# Patient Record
Sex: Female | Born: 1940
Health system: Southern US, Community
[De-identification: ages and names within clinical notes are randomized; demographics above are authoritative.]

## PROBLEM LIST (undated history)

## (undated) DIAGNOSIS — L03116 Cellulitis of left lower limb: Secondary | ICD-10-CM

## (undated) DIAGNOSIS — E119 Type 2 diabetes mellitus without complications: Secondary | ICD-10-CM

## (undated) DIAGNOSIS — L97529 Non-pressure chronic ulcer of other part of left foot with unspecified severity: Secondary | ICD-10-CM

## (undated) DIAGNOSIS — I10 Essential (primary) hypertension: Secondary | ICD-10-CM

## (undated) DIAGNOSIS — E11621 Type 2 diabetes mellitus with foot ulcer: Secondary | ICD-10-CM

## (undated) DIAGNOSIS — E559 Vitamin D deficiency, unspecified: Secondary | ICD-10-CM

## (undated) DIAGNOSIS — G40909 Epilepsy, unspecified, not intractable, without status epilepticus: Secondary | ICD-10-CM

## (undated) DIAGNOSIS — I739 Peripheral vascular disease, unspecified: Secondary | ICD-10-CM

## (undated) DIAGNOSIS — E785 Hyperlipidemia, unspecified: Secondary | ICD-10-CM

## (undated) HISTORY — DX: Type 2 diabetes mellitus with foot ulcer: E11.621

## (undated) HISTORY — DX: Essential (primary) hypertension: I10

## (undated) HISTORY — DX: Epilepsy, unspecified, not intractable, without status epilepticus: G40.909

## (undated) HISTORY — DX: Vitamin D deficiency, unspecified: E55.9

## (undated) HISTORY — DX: Hyperlipidemia, unspecified: E78.5

## (undated) HISTORY — DX: Cellulitis of left lower limb: L03.116

## (undated) HISTORY — DX: Peripheral vascular disease, unspecified: I73.9

## (undated) HISTORY — DX: Non-pressure chronic ulcer of other part of left foot with unspecified severity: L97.529

---

## 2013-02-07 DIAGNOSIS — I619 Nontraumatic intracerebral hemorrhage, unspecified: Secondary | ICD-10-CM | POA: Insufficient documentation

## 2014-02-25 DIAGNOSIS — H4011X1 Primary open-angle glaucoma, mild stage: Secondary | ICD-10-CM | POA: Diagnosis not present

## 2014-05-05 DIAGNOSIS — I10 Essential (primary) hypertension: Secondary | ICD-10-CM | POA: Diagnosis not present

## 2014-05-05 DIAGNOSIS — E119 Type 2 diabetes mellitus without complications: Secondary | ICD-10-CM | POA: Diagnosis not present

## 2014-05-05 DIAGNOSIS — E785 Hyperlipidemia, unspecified: Secondary | ICD-10-CM | POA: Diagnosis not present

## 2014-05-05 DIAGNOSIS — I739 Peripheral vascular disease, unspecified: Secondary | ICD-10-CM | POA: Diagnosis not present

## 2014-05-05 DIAGNOSIS — E1129 Type 2 diabetes mellitus with other diabetic kidney complication: Secondary | ICD-10-CM | POA: Diagnosis not present

## 2014-06-05 DIAGNOSIS — E119 Type 2 diabetes mellitus without complications: Secondary | ICD-10-CM | POA: Diagnosis not present

## 2014-06-30 DIAGNOSIS — H4011X2 Primary open-angle glaucoma, moderate stage: Secondary | ICD-10-CM | POA: Diagnosis not present

## 2014-09-18 DIAGNOSIS — R609 Edema, unspecified: Secondary | ICD-10-CM | POA: Diagnosis not present

## 2014-09-18 DIAGNOSIS — E119 Type 2 diabetes mellitus without complications: Secondary | ICD-10-CM | POA: Diagnosis not present

## 2014-09-18 DIAGNOSIS — E785 Hyperlipidemia, unspecified: Secondary | ICD-10-CM | POA: Diagnosis not present

## 2014-09-18 DIAGNOSIS — E669 Obesity, unspecified: Secondary | ICD-10-CM | POA: Diagnosis not present

## 2014-11-21 DIAGNOSIS — H401132 Primary open-angle glaucoma, bilateral, moderate stage: Secondary | ICD-10-CM | POA: Diagnosis not present

## 2014-11-21 DIAGNOSIS — H2513 Age-related nuclear cataract, bilateral: Secondary | ICD-10-CM | POA: Diagnosis not present

## 2015-02-24 DIAGNOSIS — E119 Type 2 diabetes mellitus without complications: Secondary | ICD-10-CM | POA: Diagnosis not present

## 2015-02-24 DIAGNOSIS — E118 Type 2 diabetes mellitus with unspecified complications: Secondary | ICD-10-CM | POA: Diagnosis not present

## 2015-02-24 DIAGNOSIS — I739 Peripheral vascular disease, unspecified: Secondary | ICD-10-CM | POA: Diagnosis not present

## 2015-02-24 DIAGNOSIS — E782 Mixed hyperlipidemia: Secondary | ICD-10-CM | POA: Diagnosis not present

## 2015-06-15 DIAGNOSIS — H53461 Homonymous bilateral field defects, right side: Secondary | ICD-10-CM | POA: Diagnosis not present

## 2015-06-15 DIAGNOSIS — H25013 Cortical age-related cataract, bilateral: Secondary | ICD-10-CM | POA: Diagnosis not present

## 2015-06-15 DIAGNOSIS — H401132 Primary open-angle glaucoma, bilateral, moderate stage: Secondary | ICD-10-CM | POA: Diagnosis not present

## 2015-06-15 DIAGNOSIS — H53462 Homonymous bilateral field defects, left side: Secondary | ICD-10-CM | POA: Diagnosis not present

## 2015-07-09 DIAGNOSIS — I10 Essential (primary) hypertension: Secondary | ICD-10-CM | POA: Diagnosis not present

## 2015-07-09 DIAGNOSIS — E782 Mixed hyperlipidemia: Secondary | ICD-10-CM | POA: Diagnosis not present

## 2015-07-09 DIAGNOSIS — Z0000 Encounter for general adult medical examination without abnormal findings: Secondary | ICD-10-CM | POA: Diagnosis not present

## 2015-07-09 DIAGNOSIS — E118 Type 2 diabetes mellitus with unspecified complications: Secondary | ICD-10-CM | POA: Diagnosis not present

## 2015-07-09 DIAGNOSIS — N390 Urinary tract infection, site not specified: Secondary | ICD-10-CM | POA: Diagnosis not present

## 2015-07-17 DIAGNOSIS — I739 Peripheral vascular disease, unspecified: Secondary | ICD-10-CM | POA: Diagnosis not present

## 2015-07-17 DIAGNOSIS — E119 Type 2 diabetes mellitus without complications: Secondary | ICD-10-CM | POA: Diagnosis not present

## 2015-07-17 DIAGNOSIS — E118 Type 2 diabetes mellitus with unspecified complications: Secondary | ICD-10-CM | POA: Diagnosis not present

## 2015-07-17 DIAGNOSIS — E782 Mixed hyperlipidemia: Secondary | ICD-10-CM | POA: Diagnosis not present

## 2015-07-17 DIAGNOSIS — I10 Essential (primary) hypertension: Secondary | ICD-10-CM | POA: Diagnosis not present

## 2016-05-16 ENCOUNTER — Ambulatory Visit
Admission: EM | Admit: 2016-05-16 | Discharge: 2016-05-16 | Disposition: A | Discharge status: Home / Self Care | Payer: Medicare Other | Attending: Family Medicine | Admitting: Family Medicine

## 2016-05-16 ENCOUNTER — Encounter: Payer: Self-pay | Admitting: *Deleted

## 2016-05-16 DIAGNOSIS — E08621 Diabetes mellitus due to underlying condition with foot ulcer: Secondary | ICD-10-CM | POA: Diagnosis not present

## 2016-05-16 DIAGNOSIS — E11621 Type 2 diabetes mellitus with foot ulcer: Secondary | ICD-10-CM | POA: Insufficient documentation

## 2016-05-16 DIAGNOSIS — Z87891 Personal history of nicotine dependence: Secondary | ICD-10-CM | POA: Insufficient documentation

## 2016-05-16 DIAGNOSIS — L97521 Non-pressure chronic ulcer of other part of left foot limited to breakdown of skin: Secondary | ICD-10-CM | POA: Diagnosis not present

## 2016-05-16 DIAGNOSIS — L97421 Non-pressure chronic ulcer of left heel and midfoot limited to breakdown of skin: Secondary | ICD-10-CM | POA: Diagnosis not present

## 2016-05-16 HISTORY — DX: Type 2 diabetes mellitus without complications: E11.9

## 2016-05-16 MED ORDER — CEFAZOLIN SODIUM 1 G IJ SOLR
1.0000 g | Freq: Once | INTRAMUSCULAR | Status: AC
  Administered 2016-05-16: 1 g via INTRAMUSCULAR

## 2016-05-16 MED ORDER — AMOXICILLIN-POT CLAVULANATE 875-125 MG PO TABS: 1.0000 | ORAL_TABLET | Freq: Two times a day (BID) | ORAL | 0 refills | Status: DC

## 2016-05-16 NOTE — ED Triage Notes (Signed)
Diabetic foot ulcer x 2 weeks. Ulcer is to left medial malleolus region.

## 2016-05-16 NOTE — Discharge Instructions (Signed)
Follow up at [facility]) in [place]

## 2016-05-16 NOTE — ED Provider Notes (Signed)
[facility]    CSN: [id number] Arrival date & time: 05/16/16  1027     History   Chief Complaint Chief Complaint  Patient presents with  . Skin Ulcer    HPI Jenaye Rickert is a 76 y.o. female.   76 yo diabetic female with a c/o left foot ulcer for 2-3 weeks progressively worsening with surrounding redness, drainage and pain. Patient denies any fevers or chills. Denies any kidney problems. States does not have a PCP as she just recently moved to this area and is looking for a PCP.   The history is provided by the patient.    Past Medical History:  Diagnosis Date  . Diabetes mellitus without complication (HCC)     There are no active problems to display for this patient.   History reviewed. No pertinent surgical history.  OB History    No data available       Home Medications    Prior to Admission medications   Medication Sig Start Date End Date Taking? Authorizing Provider  amoxicillin-clavulanate (AUGMENTIN) 875-125 MG tablet Take 1 tablet by mouth 2 (two) times daily. 05/16/16   Payton Mccallum, MD    Family History History reviewed. No pertinent family history.  Social History Social History  Substance Use Topics  . Smoking status: Former Games developer  . Smokeless tobacco: Never Used  . Alcohol use Yes     Allergies   Patient has no known allergies.   Review of Systems Review of Systems   Physical Exam Triage Vital Signs ED Triage Vitals  Enc Vitals Group     BP 05/16/16 1118 (!) 175/91     Pulse Rate 05/16/16 1118 60     Resp 05/16/16 1118 16     Temp 05/16/16 1118 98.7 F (37.1 C)     Temp Source 05/16/16 1118 Oral     SpO2 05/16/16 1118 100 %     Weight 05/16/16 1121 200 lb (90.7 kg)     Height 05/16/16 1121  (1.803 m)     Head Circumference --      Peak Flow --      Pain Score 05/16/16 1253 0     Pain Loc --      Pain Edu? --      Excl. in GC? --    No data found.   Updated Vital Signs BP (!) 175/91 (BP  Location: Left Arm)   Pulse 60   Temp 98.7 F (37.1 C) (Oral)   Resp 16   Ht  (1.803 m)   Wt 200 lb (90.7 kg)   SpO2 100%   BMI 27.89 kg/m   Visual Acuity Right Eye Distance:   Left Eye Distance:   Bilateral Distance:    Right Eye Near:   Left Eye Near:    Bilateral Near:     Physical Exam  Constitutional: She appears well-developed and well-nourished. No distress.  Skin: She is not diaphoretic.  approx 4x3cm superficial skin ulceration with mild purulent drainage on the left foot medially, midfoot; 1-2+ DP pulse  Nursing note and vitals reviewed.    UC Treatments / Results  Labs (all labs ordered are listed, but only abnormal results are displayed) Labs Reviewed  AEROBIC CULTURE (SUPERFICIAL SPECIMEN)    EKG  EKG Interpretation None       Radiology No results found.  Procedures Procedures (including critical care time)  Medications Ordered in UC Medications  ceFAZolin (ANCEF) injection 1 g (1 g  Intramuscular Given 05/16/16 1235)     Initial Impression / Assessment and Plan / UC Course  I have reviewed the triage vital signs and the nursing notes.  Pertinent labs & imaging results that were available during my care of the patient were reviewed by me and considered in my medical decision making (see chart for details).      Final Clinical Impressions(s) / UC Diagnoses   Final diagnoses:  Diabetic ulcer of left midfoot associated with diabetes mellitus due to underlying condition, limited to breakdown of skin [facility])    New Prescriptions Discharge Medication List as of 05/16/2016 12:41 PM    START taking these medications   Details  amoxicillin-clavulanate (AUGMENTIN) 875-125 MG tablet Take 1 tablet by mouth 2 (two) times daily., Starting Mon 05/16/2016, Normal       1. diagnosis reviewed with patient 2. Patient given Ancef 1gm IM x 1 3. Sample of drainage obtained for wound culture 4.  rx as per orders above; reviewed possible side  effects, interactions, risks and benefits  5. Recommend follow up at wound care clinic this week for further management 6. Follow-up prn if symptoms worsen or don't improve   Payton Mccallum, MD 05/16/16 1351

## 2016-05-16 NOTE — ED Notes (Signed)
Patient shows no signs of adverse reaction to medication at this time.  

## 2016-05-18 LAB — AEROBIC CULTURE  (SUPERFICIAL SPECIMEN): SPECIAL REQUESTS: NORMAL

## 2016-05-18 LAB — AEROBIC CULTURE W GRAM STAIN (SUPERFICIAL SPECIMEN): Culture: NORMAL

## 2016-05-19 ENCOUNTER — Ambulatory Visit
Admission: RE | Admit: 2016-05-19 | Discharge: 2016-05-19 | Disposition: A | Discharge status: Home / Self Care | Payer: Medicare Other | Source: Ambulatory Visit | Attending: Surgery | Admitting: Surgery

## 2016-05-19 ENCOUNTER — Other Ambulatory Visit: Payer: Self-pay | Admitting: Surgery

## 2016-05-19 ENCOUNTER — Encounter: Payer: Medicare Other | Attending: Surgery | Admitting: Surgery

## 2016-05-19 DIAGNOSIS — Z7984 Long term (current) use of oral hypoglycemic drugs: Secondary | ICD-10-CM | POA: Diagnosis not present

## 2016-05-19 DIAGNOSIS — L089 Local infection of the skin and subcutaneous tissue, unspecified: Secondary | ICD-10-CM

## 2016-05-19 DIAGNOSIS — Z87891 Personal history of nicotine dependence: Secondary | ICD-10-CM | POA: Insufficient documentation

## 2016-05-19 DIAGNOSIS — M86172 Other acute osteomyelitis, left ankle and foot: Secondary | ICD-10-CM

## 2016-05-19 DIAGNOSIS — E785 Hyperlipidemia, unspecified: Secondary | ICD-10-CM | POA: Diagnosis not present

## 2016-05-19 DIAGNOSIS — I739 Peripheral vascular disease, unspecified: Secondary | ICD-10-CM | POA: Diagnosis not present

## 2016-05-19 DIAGNOSIS — E119 Type 2 diabetes mellitus without complications: Secondary | ICD-10-CM | POA: Diagnosis not present

## 2016-05-19 DIAGNOSIS — L97322 Non-pressure chronic ulcer of left ankle with fat layer exposed: Secondary | ICD-10-CM | POA: Insufficient documentation

## 2016-05-19 DIAGNOSIS — M7989 Other specified soft tissue disorders: Secondary | ICD-10-CM | POA: Insufficient documentation

## 2016-05-19 DIAGNOSIS — I87312 Chronic venous hypertension (idiopathic) with ulcer of left lower extremity: Secondary | ICD-10-CM | POA: Diagnosis not present

## 2016-05-19 DIAGNOSIS — E11621 Type 2 diabetes mellitus with foot ulcer: Secondary | ICD-10-CM | POA: Insufficient documentation

## 2016-05-19 DIAGNOSIS — L97522 Non-pressure chronic ulcer of other part of left foot with fat layer exposed: Secondary | ICD-10-CM | POA: Diagnosis not present

## 2016-05-20 NOTE — Progress Notes (Addendum)
JAQUITTA, DUPRIEST ([id number]) Visit Report for 05/19/2016 Chief Complaint Document Details Patient Name: [name], [name] Date of Service: 05/19/2016 10:30 AM Medical Record Number: [id number] Patient Account Number: [id number] Date of Birth/Sex: [birth year] (76 y.o. Female) Treating RN: Ashok Cordia, Debi Primary Care Provider: Schuyler Amor Other Clinician: Referring Provider: Payton Mccallum Treating Provider/Extender: Rudene Re in Treatment: 0 Information Obtained from: Patient Chief Complaint Patients presents for treatment of an open diabetic ulcer to the left medial ankle which she's had for about 3 weeks Electronic Signature(s) Signed: 05/19/2016 12:08:03 PM By: Evlyn Kanner MD, FACS Entered By: Evlyn Kanner on 05/19/2016 12:08:03 [name] ([id number]) -------------------------------------------------------------------------------- Debridement Details Patient Name: [name] Date of Service: 05/19/2016 10:30 AM Medical Record Number: [id number] Patient Account Number: [id number] Date of Birth/Sex: 1940/05/10 (76 y.o. Female) Treating RN: Ashok Cordia, Debi Primary Care Provider: Schuyler Amor Other Clinician: Referring Provider: Payton Mccallum Treating Provider/Extender: Rudene Re in Treatment: 0 Debridement Performed for Wound #1 Left,Medial Foot Assessment: Performed By: Physician Evlyn Kanner, MD Debridement: Debridement Pre-procedure Yes - 11:07 Verification/Time Out Taken: Start Time: 11:08 Pain Control: Lidocaine 4% Topical Solution Level: Skin/Subcutaneous Tissue Total Area Debrided (L x 4 (cm) x 4.5 (cm) = 18 (cm) W): Tissue and other Viable, Non-Viable, Exudate, Fibrin/Slough, Subcutaneous material debrided: Instrument: Forceps, Scissors Bleeding: Minimum Hemostasis Achieved: Pressure End Time: 11:12 Procedural Pain: 0 Post Procedural Pain: 0 Response to Treatment: Procedure was tolerated well Post Debridement  Measurements of Total Wound Length: (cm) 3.4 Width: (cm) 4 Depth: (cm) 0.3 Volume: (cm) 3.204 Character of Wound/Ulcer Post Requires Further Debridement Debridement: Severity of Tissue Post Debridement: Fat layer exposed Post Procedure Diagnosis Same as Pre-procedure Electronic Signature(s) Signed: 05/19/2016 12:07:42 PM By: Evlyn Kanner MD, FACS Signed: 05/19/2016 4:33:22 PM By: Alejandro Mulling Entered By: Evlyn Kanner on 05/19/2016 12:07:41 CYNIAH, GOSSARD ([id number] [name] ([id number]) -------------------------------------------------------------------------------- HPI Details Patient Name: [name] Date of Service: 05/19/2016 10:30 AM Medical Record Number: [id number] Patient Account Number: [id number] Date of Birth/Sex: 04-03-1940 (76 y.o. Female) Treating RN: Ashok Cordia, Debi Primary Care Provider: Schuyler Amor Other Clinician: Referring Provider: Payton Mccallum Treating Provider/Extender: Rudene Re in Treatment: 0 History of Present Illness Location: left medial ankle and foot Quality: Patient reports experiencing a dull pain to affected area(s). Severity: Patient states wound are getting worse. Duration: Patient has had the wound for < 4 weeks prior to presenting for treatment Timing: Pain in wound is constant (hurts all the time) Context: The wound appeared gradually over time Modifying Factors: Other treatment(s) tried include:went to the ER recently where she was put on Augmentin Associated Signs and Symptoms: Patient reports having increase swelling. HPI Description: 76 year old patient known to have diabetes mellitus has a left foot ulcer for about 3 weeks which has been causing her pain with redness and drainage. She was recently seen at the urgent care facility where the patient had been placed on Augmentin and I do not find any x-ray was done. She was also given an intramuscular Ancef injection and asked to follow-up  with the wound care. Culture showed normal skin flora. She is a former smoker She has recently moved from [place] and has not been taking treatment for her diabetes mellitus and though she wears compression stockings she has never been worked up for arterial or venous disease in the past Electronic Signature(s) Signed: 05/19/2016 12:09:22 PM By: Evlyn Kanner MD, FACS Previous Signature: 05/19/2016 10:54:12 AM Version By: Evlyn Kanner MD, FACS Previous Signature: 05/19/2016 10:34:55 AM Version By: Evlyn Kanner MD, FACS Previous  Signature: 05/19/2016 10:31:18 AM Version By: Evlyn Kanner MD, FACS Entered By: Evlyn Kanner on 05/19/2016 12:09:21 [name] ([id number]) -------------------------------------------------------------------------------- Physical Exam Details Patient Name: [name] Date of Service: 05/19/2016 10:30 AM Medical Record Number: [id number] Patient Account Number: [id number] Date of Birth/Sex: 1940-05-20 (75 y.o. Female) Treating RN: Ashok Cordia, Debi Primary Care Provider: Schuyler Amor Other Clinician: Referring Provider: Payton Mccallum Treating Provider/Extender: Rudene Re in Treatment: 0 Constitutional . Pulse regular. Respirations normal and unlabored. Afebrile. . Eyes Nonicteric. Reactive to light. Ears, Nose, Mouth, and Throat Lips, teeth, and gums WNL.Marland Kitchen Moist mucosa without lesions. Neck supple and nontender. No palpable supraclavicular or cervical adenopathy. Normal sized without goiter. Respiratory WNL. No retractions.. Cardiovascular Pedal Pulses WNL. ABI was noncompressible bilaterally. No clubbing, cyanosis or edema.. Chest Breasts symmetical and no nipple discharge.. Breast tissue WNL, no masses, lumps, or tenderness.. Gastrointestinal (GI) Abdomen without masses or tenderness.. No liver or spleen enlargement or tenderness.. Lymphatic No adneopathy. No adenopathy. No adenopathy. Musculoskeletal Adexa without  tenderness or enlargement.. Digits and nails w/o clubbing, cyanosis, infection, petechiae, ischemia, or inflammatory conditions.. Integumentary (Hair, Skin) No suspicious lesions. No crepitus or fluctuance. No peri-wound warmth or erythema. No masses.Marland Kitchen Psychiatric Judgement and insight Intact.. No evidence of depression, anxiety, or agitation.. Notes the patient has typical stigmata of venous hypertension on her left lower extremity and the ulcers in the left medial ankle and foot. She has breakdown of skin and subcutaneous tissue and there is no evidence of surrounding cellulitis. Electronic Signature(s) Signed: 05/19/2016 12:10:14 PM By: Evlyn Kanner MD, FACS Entered By: Evlyn Kanner on 05/19/2016 12:10:13 LYNASIA, MELOCHE ([id number]) EVONA, WESTRA ([id number]) -------------------------------------------------------------------------------- Physician Orders Details Patient Name: [name] Date of Service: 05/19/2016 10:30 AM Medical Record Number: [id number] Patient Account Number: [id number] Date of Birth/Sex: [birth year] (76 y.o. Female) Treating RN: Ashok Cordia, Debi Primary Care Provider: Schuyler Amor Other Clinician: Referring Provider: Payton Mccallum Treating Provider/Extender: Rudene Re in Treatment: 0 Verbal / Phone Orders: Yes Clinician: Pinkerton, Debi Read Back and Verified: Yes Diagnosis Coding Wound Cleansing Wound #1 Left,Medial Foot o Clean wound with Normal Saline. o May Shower, gently pat wound dry prior to applying new dressing. Anesthetic Wound #1 Left,Medial Foot o Topical Lidocaine 4% cream applied to wound bed prior to debridement Primary Wound Dressing Wound #1 Left,Medial Foot o Aquacel Ag Secondary Dressing Wound #1 Left,Medial Foot o ABD pad o Dry Gauze o Conform/Kerlix o Other - tape, stretch netting Dressing Change Frequency Wound #1 Left,Medial Foot o Change dressing every day. Follow-up  Appointments Wound #1 Left,Medial Foot o Return Appointment in 1 week. Edema Control Wound #1 Left,Medial Foot o Elevate legs to the level of the heart and pump ankles as often as possible o Other: - pt to wear own compression hose Additional Orders / Instructions Wound #1 Left,Medial Foot Loconte, Shireen ([id number]) o Increase protein intake. Medications-please add to medication list. Wound #1 Left,Medial Foot o Other: - vitamin c, zinc, MVI Radiology o X-ray, foot - left Services and Therapies o Arterial Studies- Bilateral o Venous Studies -Bilateral Electronic Signature(s) Signed: 05/19/2016 2:17:24 PM By: Evlyn Kanner MD, FACS Signed: 05/19/2016 4:33:22 PM By: Alejandro Mulling Entered By: Alejandro Mulling on 05/19/2016 11:25:40 [name] ([id number]) -------------------------------------------------------------------------------- Problem List Details Patient Name: [name] Date of Service: 05/19/2016 10:30 AM Medical Record Number: [id number] Patient Account Number: [id number] Date of Birth/Sex: 02/24/1940 (76 y.o. 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 05/19/2016 Yes exposed I73.9 Peripheral vascular disease, unspecified 05/19/2016 Yes I87.312 Chronic venous hypertension (idiopathic) with ulcer of left 05/19/2016 Yes lower extremity Inactive Problems Resolved Problems Electronic Signature(s) Signed: 05/19/2016 12:07:22 PM By: Evlyn Kanner MD, FACS Entered By: Evlyn Kanner on 05/19/2016 12:07:21 [name]  ([id number]) -------------------------------------------------------------------------------- Progress Note Details Patient Name: [name] Date of Service: 05/19/2016 10:30 AM Medical Record Number: [id number] Patient Account Number: [id number] Date of Birth/Sex: [birth year] (76 y.o. Female) Treating RN: Phillis Haggis Primary Care Provider: Schuyler Amor Other Clinician: Referring Provider: Payton Mccallum Treating Provider/Extender: Rudene Re in Treatment: 0 Subjective Chief Complaint Information obtained from Patient Patients presents for treatment of an open diabetic ulcer to the left medial ankle which she's had for about 3 weeks History of Present Illness (HPI) The following HPI elements were documented for the patient's wound: Location: left medial ankle and foot Quality: Patient reports experiencing a dull pain to affected area(s). Severity: Patient states wound are getting worse. Duration: Patient has had the wound for < 4 weeks prior to presenting for treatment Timing: Pain in wound is constant (hurts all the time) Context: The wound appeared gradually over time Modifying Factors: Other treatment(s) tried include:went to the ER recently where she was put on Augmentin Associated Signs and Symptoms: Patient reports having increase swelling. 76 year old patient known to have diabetes mellitus has a left foot ulcer for about 3 weeks which has been causing her pain with redness and drainage. She was recently seen at the urgent care facility where the patient had been placed on Augmentin and I do not find any x-ray was done. She was also given an intramuscular Ancef injection and asked to follow-up with the wound care. Culture showed normal skin flora. She is a former smoker She has recently moved from [place] and has not been taking treatment for her diabetes mellitus and though she wears compression stockings she has never been worked up for arterial or  venous disease in the past Wound History Patient presents with 2 open wounds that have been present for approximately 3 wks. Patient has been treating wounds in the following manner: vaseline, Augmentin. Laboratory tests have not been performed in the last month. Patient reportedly has not tested positive for an antibiotic resistant organism. Patient reportedly has not tested positive for osteomyelitis. Patient reportedly has not had testing performed to evaluate circulation in the legs. Patient experiences the following problems associated with their wounds: swelling. Patient History CHESSA, BARRASSO ([id number]) Information obtained from Patient. Allergies NKDA Family History Diabetes - Child. Social History Former smoker - quit 10 yrs ago, Marital Status - Married, Alcohol Use - Rarely, Drug Use - No History, Caffeine Use - Daily. Medical History Endocrine Patient has history of Type II Diabetes Patient is treated with Oral Agents. Blood sugar is not tested. Review of Systems (ROS) Constitutional Symptoms (General Health) The patient has no complaints or symptoms. Eyes Complains or has symptoms of Glasses / Contacts. Ear/Nose/Mouth/Throat The patient has no complaints or symptoms. Hematologic/Lymphatic The patient has no complaints or symptoms. Respiratory The patient has no complaints or symptoms. Cardiovascular hyperlipidemia Genitourinary The patient has no complaints or symptoms. Immunological The patient has no complaints or symptoms. Integumentary (Skin) Complains or has symptoms of Wounds. Musculoskeletal The patient has no complaints or symptoms. Neurologic The patient has no complaints  or symptoms. Oncologic The patient has no complaints or symptoms. Psychiatric The patient has no complaints or symptoms. KATLYN, MULDREW ([id number]) Medications Crestor Janumet Vitamin B-12 amoxicillin 875 mg-potassium clavulanate 125 mg tablet oral tablet  oral Aspirin Childrens 81 mg chewable tablet oral tablet,chewable oral Objective Constitutional Pulse regular. Respirations normal and unlabored. Afebrile. Vitals Time Taken: 10:30 AM, Height: 71 in, Source: Stated, Weight: 212.2 lbs, Source: Measured, BMI: 29.6, Temperature: 97.7 F, Pulse: 59 bpm, Respiratory Rate: 18 breaths/min, Blood Pressure: 168/78 mmHg. Eyes Nonicteric. Reactive to light. Ears, Nose, Mouth, and Throat Lips, teeth, and gums WNL.Marland Kitchen Moist mucosa without lesions. Neck supple and nontender. No palpable supraclavicular or cervical adenopathy. Normal sized without goiter. Respiratory WNL. No retractions.. Cardiovascular Pedal Pulses WNL. ABI was noncompressible bilaterally. No clubbing, cyanosis or edema.. Chest Breasts symmetical and no nipple discharge.. Breast tissue WNL, no masses, lumps, or tenderness.. Gastrointestinal (GI) Abdomen without masses or tenderness.. No liver or spleen enlargement or tenderness.. Lymphatic No adneopathy. No adenopathy. No adenopathy. Musculoskeletal Adexa without tenderness or enlargement.. Digits and nails w/o clubbing, cyanosis, infection, petechiae, ischemia, or inflammatory conditions.Marland Kitchen Psychiatric MIHIKA, SURRETTE ([id number]) Judgement and insight Intact.. No evidence of depression, anxiety, or agitation.. General Notes: the patient has typical stigmata of venous hypertension on her left lower extremity and the ulcers in the left medial ankle and foot. She has breakdown of skin and subcutaneous tissue and there is no evidence of surrounding cellulitis. Integumentary (Hair, Skin) No suspicious lesions. No crepitus or fluctuance. No peri-wound warmth or erythema. No masses.. Wound #1 status is Open. Original cause of wound was Gradually Appeared. The wound is located on the Left,Medial Foot. The wound measures 4cm length x 4.5cm width x 0.1cm depth; 14.137cm^2 area and 1.414cm^3 volume. There is no tunneling or  undermining noted. There is a large amount of serous drainage noted. The wound margin is distinct with the outline attached to the wound base. There is no granulation within the wound bed. There is a large (67-100%) amount of necrotic tissue within the wound bed including Eschar and Adherent Slough. Periwound temperature was noted as No Abnormality. The periwound has tenderness on palpation. 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 After examining her my clinical impression is that of venous hypertension with stigmata of venous disease on the left lower extremity. I have recommended: 1. Silver alginate and a light Kerlix dressing and she can wear her compression stockings over this. 2. Elevation and exercise have been discussed with her in great detail. 3. arterial duplex study of both lower extremities 4. Venous reflux studies of both lower extremities 5. X-ray of the left foot He and her husband have read all questions answered and will be compliant with seeing her PCP for control of her medical condition and seeing me regularly at the wound center [place] ([id number]) Procedures Wound #1 Wound #1 is a Diabetic Wound/Ulcer of the Lower Extremity located on the Left,Medial Foot . There was a Skin/Subcutaneous Tissue Debridement ([id number]) debridement with total area of 18 sq cm performed by Evlyn Kanner, MD. with the following instrument(s): Forceps and Scissors to remove Viable and  Non- Viable tissue/material including Exudate,  Fibrin/Slough, and Subcutaneous after achieving pain control using Lidocaine 4% Topical Solution. A time out was conducted at 11:07, prior to the start of the procedure. A Minimum amount of bleeding was controlled with Pressure. The procedure was tolerated well with a pain level of 0 throughout and a pain level of 0 following the procedure. Post Debridement Measurements: 3.4cm length x 4cm width x 0.3cm depth; 3.204cm^3 volume. Character of Wound/Ulcer Post Debridement requires further debridement. Severity of Tissue Post Debridement is: Fat layer exposed. Post procedure Diagnosis Wound #1: Same as Pre-Procedure Plan Wound Cleansing: Wound #1 Left,Medial Foot: Clean wound with Normal Saline. May Shower, gently pat wound dry prior to applying new dressing. Anesthetic: Wound #1 Left,Medial Foot: Topical Lidocaine 4% cream applied to wound bed prior to debridement Primary Wound Dressing: Wound #1 Left,Medial Foot: Aquacel Ag Secondary Dressing: Wound #1 Left,Medial Foot: ABD pad Dry Gauze Conform/Kerlix Other - tape, stretch netting Dressing Change Frequency: Wound #1 Left,Medial Foot: Change dressing every day. Follow-up Appointments: Wound #1 Left,Medial Foot: Return Appointment in 1 week. Edema Control: Wound #1 Left,Medial Foot: Elevate legs to the level of the heart and pump ankles as often as possible Other: - pt to wear own compression hose Additional Orders / InstructionsLUELLA, GARDENHIRE ([id number]) Wound #1 Left,Medial Foot: Increase protein intake. Medications-please add to medication list.: Wound #1 Left,Medial Foot: Other: - vitamin c, zinc, MVI Radiology ordered were: X-ray, foot - left Services and Therapies ordered were: Arterial Studies- Bilateral, Venous Studies -Bilateral this 76 year old patient has been noncompliant with her medications and management of diabetes and has not taken treatment for about 6 months and has not had her A1c measured  recently. After examining her my clinical impression is that of venous hypertension with stigmata of venous disease on the left lower extremity. I have recommended: 1. Silver alginate and a light Kerlix dressing and she can wear her compression stockings over this. 2. Elevation and exercise have been discussed with her in great detail. 3. arterial duplex study of both lower extremities 4. Venous reflux studies of both lower extremities 5. X-ray of the left foot He and her husband have read all questions answered and will be compliant with seeing her PCP for control of her medical condition and seeing me regularly at the wound center Electronic Signature(s) Signed: 05/19/2016 12:13:05 PM By: Evlyn Kanner MD, FACS Entered By: Evlyn Kanner on 05/19/2016 12:13:04 [name] ([id number]) -------------------------------------------------------------------------------- ROS/PFSH Details Patient Name: [name] Date of Service: 05/19/2016 10:30 AM Medical Record Number: [id number] Patient Account Number: [id number] Date of Birth/Sex: 23-Jan-1941 (76 y.o. Female) Treating RN: Ashok Cordia, Debi Primary Care Provider: Schuyler Amor Other Clinician: Referring Provider: Payton Mccallum Treating Provider/Extender: Rudene Re in Treatment: 0 Information Obtained From Patient Wound History Do you currently have one or more open woundso Yes How many open wounds do you currently haveo 2 Approximately how long have you had your woundso 3 wks How have you been treating your wound(s) until nowo vaseline, Augmentin Has your wound(s) ever healed and then re-openedo No Have you had any lab work done in the past montho No Have you tested positive for an antibiotic resistant organism (MRSA, VRE)o No Have you tested positive for osteomyelitis (bone infection)o No Have you had any tests for circulation on your legso No Have you had other problems associated with your woundso  Swelling Eyes Complaints and Symptoms: Positive for: Glasses / Contacts Integumentary (Skin) Complaints and Symptoms: Positive for: Wounds Constitutional Symptoms (  General Health) Complaints and Symptoms: No Complaints or Symptoms Ear/Nose/Mouth/Throat Complaints and Symptoms: No Complaints or Symptoms Hematologic/Lymphatic Complaints and Symptoms: No Complaints or Symptoms Respiratory JASLYNN, THOME ([id number]) Complaints and Symptoms: No Complaints or Symptoms Cardiovascular Complaints and Symptoms: Review of System Notes: hyperlipidemia Endocrine Medical History: Positive for: Type II Diabetes Time with diabetes: 6 yrs Treated with: Oral agents Blood sugar tested every day: No Genitourinary Complaints and Symptoms: No Complaints or Symptoms Immunological Complaints and Symptoms: No Complaints or Symptoms Musculoskeletal Complaints and Symptoms: No Complaints or Symptoms Neurologic Complaints and Symptoms: No Complaints or Symptoms Oncologic Complaints and Symptoms: No Complaints or Symptoms Psychiatric Complaints and Symptoms: No Complaints or Symptoms Immunizations Pneumococcal Vaccine: Received Pneumococcal Vaccination: No VANESA, RENIER ([id number]) Family and Social History Diabetes: Yes - Child; Former smoker - quit 10 yrs ago; Marital Status - Married; Alcohol Use: Rarely; Drug Use: No History; Caffeine Use: Daily; Financial Concerns: No; Food, Clothing or Shelter Needs: No; Support System Lacking: No; Transportation Concerns: No; Advanced Directives: No; Patient does not want information on Advanced Directives; Do not resuscitate: No; Living Will: No; Medical Power of Attorney: No Physician Affirmation I have reviewed and agree with the above information. Electronic Signature(s) Signed: 05/19/2016 2:17:24 PM By: Evlyn Kanner MD, FACS Signed: 05/19/2016 4:33:22 PM By: Alejandro Mulling Entered By: Evlyn Kanner on 05/19/2016  11:00:49 [name] ([id number]) -------------------------------------------------------------------------------- SuperBill Details Patient Name: [name] Date of Service: 05/19/2016 Medical Record Number: [id number] Patient Account Number: [id number] Date of Birth/Sex: May 20, 1940 (76 y.o. Female) Treating RN: Ashok Cordia, Debi Primary Care Provider: Schuyler Amor Other Clinician: Referring Provider: Payton Mccallum Treating Provider/Extender: Rudene Re in Treatment: 0 Diagnosis Coding ICD-10 Codes Code Description E11.621 Type 2 diabetes mellitus with foot ulcer L97.322 Non-pressure chronic ulcer of left ankle with fat layer exposed I73.9 Peripheral vascular disease, unspecified I87.312 Chronic venous hypertension (idiopathic) with ulcer of left lower extremity Facility Procedures CPT4 Code Description: [id number] 99213 - WOUND CARE VISIT-LEV 3 EST PT Modifier: Quantity: 1 CPT4 Code Description: [id number] 11042 - DEB SUBQ TISSUE 20 SQ CM/< ICD-10 Description Diagnosis E11.621 Type 2 diabetes mellitus with foot ulcer L97.322 Non-pressure chronic ulcer of left ankle with fat la I87.312 Chronic venous hypertension (idiopathic)  with ulcer Modifier: yer exposed of left lower Quantity: 1 extremity Physician Procedures CPT4 Code Description: [id number] - WC PHYS LEVEL 4 - NEW PT ICD-10 Description Diagnosis E11.621 Type 2 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 Peripheral vascular disease, unspecified  I87.312 Chronic venous hypertension (idiopathic) with ulcer Modifier: 25 yer exposed of left lower Quantity: 1 extremity CPT4 Code Description: [id number] - WC PHYS SUBQ TISS 20 SQ CM ICD-10 Description Diagnosis E11.621 Type 2 diabetes mellitus with foot ulcer 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

## 2016-05-21 NOTE — Progress Notes (Signed)
KERRIE, TIMM ([id number]) Visit Report for 05/19/2016 Abuse/Suicide Risk Screen Details Patient Name: [name], [name] Date of Service: 05/19/2016 10:30 AM Medical Record Number: [id number] Patient Account Number: [id number] Date of Birth/Sex: 12-Feb-1940 (76 y.o. Female) Treating RN: Ashok Cordia, Debi Primary Care Tacey Dimaggio: Schuyler Amor Other Clinician: Referring Cyncere Ruhe: Payton Mccallum Treating Nolita Kutter/Extender: Rudene Re in Treatment: 0 Abuse/Suicide Risk Screen Items Answer ABUSE/SUICIDE RISK SCREEN: Has anyone close to you tried to hurt or harm you recentlyo No Do you feel uncomfortable with anyone in your familyo No Has anyone forced you do things that you didnot want to doo No Do you have any thoughts of harming yourselfo No Patient displays signs or symptoms of abuse and/or neglect. No Electronic Signature(s) Signed: 05/19/2016 4:33:22 PM By: Alejandro Mulling Entered By: Alejandro Mulling on 05/19/2016 10:39:11 [name] ([id number]) -------------------------------------------------------------------------------- Activities of Daily Living Details Patient Name: [name] Date of Service: 05/19/2016 10:30 AM Medical Record Number: [id number] Patient Account Number: [id number] Date of Birth/Sex: [birth year] (76 y.o. Female) Treating RN: Phillis Haggis Primary Care Ronnell Makarewicz: Schuyler Amor Other Clinician: Referring Mahdiya Mossberg: Payton Mccallum Treating Omega Durante/Extender: Rudene Re in Treatment: 0 Activities of Daily Living Items Answer Activities of Daily Living (Please select one for each item) Drive Automobile Completely Able Take Medications Completely Able Use Telephone Completely Able Care for Appearance Completely Able Use Toilet Completely Able Bath / Shower Completely Able Dress Self Completely Able Feed Self Completely Able Walk Completely Able Get In / Out Bed Completely Able Housework Completely Able Prepare Meals  Completely Able Handle Money Completely Able Shop for Self Completely Able Electronic Signature(s) Signed: 05/19/2016 4:33:22 PM By: Alejandro Mulling Entered By: Alejandro Mulling on 05/19/2016 10:39:27 [name] ([id number]) -------------------------------------------------------------------------------- Education Assessment Details Patient Name: [name] Date of Service: 05/19/2016 10:30 AM Medical Record Number: [id number] Patient Account Number: [id number] Date of Birth/Sex: 1940-05-31 (76 y.o. Female) Treating RN: Ashok Cordia, Debi Primary Care Tishina Lown: Schuyler Amor Other Clinician: Referring Rashod Gougeon: Payton Mccallum Treating Bettejane Leavens/Extender: Rudene Re in Treatment: 0 Primary Learner Assessed: Patient Learning Preferences/Education Level/Primary Language Learning Preference: Explanation, Printed Material Highest Education Level: High School Preferred Language: English Cognitive Barrier Assessment/Beliefs Language Barrier: No Translator Needed: No Memory Deficit: No Emotional Barrier: No Cultural/Religious Beliefs Affecting Medical No Care: Physical Barrier Assessment Impaired Vision: Yes Glasses Impaired Hearing: No Decreased Hand dexterity: No Knowledge/Comprehension Assessment Knowledge Level: Medium Comprehension Level: Medium Ability to understand written Medium instructions: Ability to understand verbal Medium instructions: Motivation Assessment Anxiety Level: Calm Cooperation: Cooperative Education Importance: Acknowledges Need Interest in Health Problems: Asks Questions Perception: Coherent Willingness to Engage in Self- Medium Management Activities: Readiness to Engage in Self- Medium Management Activities: Electronic Signature(s) DEB, LOUDIN ([id number]) Signed: 05/19/2016 4:33:22 PM By: Alejandro Mulling Entered By: Alejandro Mulling on 05/19/2016 10:39:55 [name]  ([id number]) -------------------------------------------------------------------------------- Fall Risk Assessment Details Patient Name: [name] Date of Service: 05/19/2016 10:30 AM Medical Record Number: [id number] Patient Account Number: [id number] Date of Birth/Sex: 06/20/1940 (76 y.o. Female) Treating RN: Ashok Cordia, Debi Primary Care Rickie Gutierres: Schuyler Amor Other Clinician: Referring Christiana Gurevich: Payton Mccallum Treating Kerrilyn Azbill/Extender: Rudene Re in Treatment: 0 Fall Risk Assessment Items Have you had 2 or more falls in the last 12 monthso 0 No Have you had any fall that resulted in injury in the last 12 monthso 0 No FALL RISK ASSESSMENT: History of falling - immediate or within 3 months 0 No Secondary diagnosis 0 No Ambulatory aid None/bed rest/wheelchair/nurse 0 No Crutches/cane/walker 0 No Furniture 0 No IV Access/Saline Lock 0 No Gait/Training  Normal/bed rest/immobile 0 No Weak 0 No Impaired 0 No Mental Status Oriented to own ability 0 Yes Electronic Signature(s) Signed: 05/19/2016 4:33:22 PM By: Alejandro Mulling Entered By: Alejandro Mulling on 05/19/2016 10:40:06 [name] ([id number]) -------------------------------------------------------------------------------- Foot Assessment Details Patient Name: [name] Date of Service: 05/19/2016 10:30 AM Medical Record Number: [id number] Patient Account Number: [id number] Date of Birth/Sex: 12-09-1940 (76 y.o. Female) Treating RN: Ashok Cordia, Debi Primary Care Lyall Faciane: Schuyler Amor Other Clinician: Referring Kyrsten Deleeuw: Payton Mccallum Treating Rhodes Calvert/Extender: Rudene Re in Treatment: 0 Foot Assessment Items Site Locations + = Sensation present, - = Sensation absent, C = Callus, U = Ulcer R = Redness, W = Warmth, M = Maceration, PU = Pre-ulcerative lesion F = Fissure, S = Swelling, D = Dryness Assessment Right: Left: Other Deformity: No No Prior Foot Ulcer: No  No Prior Amputation: No No Charcot Joint: No No Ambulatory Status: Ambulatory Without Help Gait: Steady Electronic Signature(s) Signed: 05/19/2016 4:33:22 PM By: Alejandro Mulling Entered By: Alejandro Mulling on 05/19/2016 10:41:56 [name] ([id number]) -------------------------------------------------------------------------------- Nutrition Risk Assessment Details Patient Name: [name] Date of Service: 05/19/2016 10:30 AM Medical Record Number: [id number] Patient Account Number: [id number] Date of Birth/Sex: Jun 20, 1940 (77 y.o. Female) Treating RN: Ashok Cordia, Debi Primary Care Horace Lukas: Schuyler Amor Other Clinician: Referring Reta Norgren: Payton Mccallum Treating Agatha Duplechain/Extender: Rudene Re in Treatment: 0 Height (in): 71 Weight (lbs): 212.2 Body Mass Index (BMI): 29.6 Nutrition Risk Assessment Items NUTRITION RISK SCREEN: I have an illness or condition that made me change the kind and/or 0 No amount of food I eat I eat fewer than two meals per day 0 No I eat few fruits and vegetables, or milk products 0 No I have three or more drinks of beer, liquor or wine almost every day 0 No I have tooth or mouth problems that make it hard for me to eat 0 No I don't always have enough money to buy the food I need 0 No I eat alone most of the time 0 No I take three or more different prescribed or over-the-counter drugs a 1 Yes day Without wanting to, I have lost or gained 10 pounds in the last six 0 No months I am not always physically able to shop, cook and/or feed myself 0 No Nutrition Protocols Good Risk Protocol Moderate Risk Protocol Electronic Signature(s) Signed: 05/19/2016 4:33:22 PM By: Alejandro Mulling Entered By: Alejandro Mulling on 05/19/2016 10:40:12

## 2016-05-21 NOTE — Progress Notes (Signed)
[name], [name] ([id number]) Visit Report for 05/19/2016 Allergy List Details Patient Name: [name], [name] Date of Service: 05/19/2016 10:30 AM Medical Record Number: [id number] Patient Account Number: [id number] Date of Birth/Sex: Dec 26, 1940 (76 y.o. Female) Treating RN: Ashok Cordia, Debi Primary Care Mayeli Bornhorst: Schuyler Amor Other Clinician: Referring Tarnisha Kachmar: Payton Mccallum Treating Christoher Drudge/Extender: Rudene Re in Treatment: 0 Allergies Active Allergies NKDA Allergy Notes Electronic Signature(s) Signed: 05/19/2016 4:33:22 PM By: Alejandro Mulling Entered By: Alejandro Mulling on 05/19/2016 10:33:51 [name] ([id number]) -------------------------------------------------------------------------------- Arrival Information Details Patient Name: [name] Date of Service: 05/19/2016 10:30 AM Medical Record Number: [id number] Patient Account Number: [id number] Date of Birth/Sex: [birth year] (75 y.o. Female) Treating RN: Ashok Cordia, Debi Primary Care Henriette Hesser: Schuyler Amor Other Clinician: Referring Odell Choung: Payton Mccallum Treating Randale Carvalho/Extender: Rudene Re in Treatment: 0 Visit Information Patient Arrived: Ambulatory Arrival Time: 10:26 Accompanied By: husband Transfer Assistance: None Patient Identification Verified: Yes Secondary Verification Process Yes Completed: Patient Requires Transmission- No Based Precautions: Patient Has Alerts: Yes Patient Alerts: DM II L ABI noncompressible Electronic Signature(s) Signed: 05/19/2016 4:33:22 PM By: Alejandro Mulling Entered By: Alejandro Mulling on 05/19/2016 10:47:32 [name] ([id number]) -------------------------------------------------------------------------------- Clinic Level of Care Assessment Details Patient Name: [name] Date of Service: 05/19/2016 10:30 AM Medical Record Number: [id number] Patient Account Number: [id number] Date of Birth/Sex: 1940/06/14  (76 y.o. Female) Treating RN: Ashok Cordia, Debi Primary Care Norvella Loscalzo: Schuyler Amor Other Clinician: Referring Mashayla Lavin: Payton Mccallum Treating Farrel Guimond/Extender: Rudene Re in Treatment: 0 Clinic Level of Care Assessment Items TOOL 1 Quantity Score X - Use when EandM and Procedure is performed on INITIAL visit 1 0 ASSESSMENTS - Nursing Assessment / Reassessment X - General Physical Exam (combine w/ comprehensive assessment (listed just 1 20 below) when performed on new pt. evals) X - Comprehensive Assessment (HX, ROS, Risk Assessments, Wounds Hx, etc.) 1 25 ASSESSMENTS - Wound and Skin Assessment / Reassessment  - Dermatologic / Skin Assessment (not related to wound area) 0 ASSESSMENTS - Ostomy and/or Continence Assessment and Care  - Incontinence Assessment and Management 0  - Ostomy Care Assessment and Management (repouching, etc.) 0 PROCESS - Coordination of Care  - Simple Patient / Family Education for ongoing care 0 X - Complex (extensive) Patient / Family Education for ongoing care 1 20 X - Staff obtains Chiropractor, Records, Test Results / Process Orders 1 10  - Staff telephones HHA, Nursing Homes / Clarify orders / etc 0  - Routine Transfer to another Facility (non-emergent condition) 0  - Routine Hospital Admission (non-emergent condition) 0 X - New Admissions / Manufacturing engineer / Ordering NPWT, Apligraf, etc. 1 15  - Emergency Hospital Admission (emergent condition) 0 PROCESS - Special Needs  - Pediatric / Minor Patient Management 0  - Isolation Patient Management 0 [name], [name] ([id number])  - Hearing / Language / Visual special needs 0  - Assessment of Community assistance (transportation, D/C planning, etc.) 0  - Additional assistance / Altered mentation 0  - Support Surface(s) Assessment (bed, cushion, seat, etc.) 0 INTERVENTIONS - Miscellaneous  - External ear exam 0  - Patient Transfer (multiple staff / Water quality scientist / Similar devices) 0  - Simple Staple / Suture removal (25 or less) 0  - Complex Staple / Suture removal (26 or more) 0  - Hypo/Hyperglycemic Management (do not check if billed separately) 0 X - Ankle / Brachial Index (ABI) - do not check if billed separately 1 15 Has the patient been seen at the hospital within the last three years: Yes Total  Score: 105 Level Of Care: New/Established - Level 3 Electronic Signature(s) Signed: 05/19/2016 4:33:22 PM By: Alejandro Mulling Entered By: Alejandro Mulling on 05/19/2016 13:52:58 [name] ([id number]) -------------------------------------------------------------------------------- Encounter Discharge Information Details Patient Name: [name] Date of Service: 05/19/2016 10:30 AM Medical Record Number: [id number] Patient Account Number: [id number] Date of Birth/Sex: 1940-09-27 (76 y.o. Female) Treating RN: Ashok Cordia, Debi Primary Care Nykerria Macconnell: Schuyler Amor Other Clinician: Referring Ulus Hazen: Payton Mccallum Treating Uri Covey/Extender: Rudene Re in Treatment: 0 Encounter Discharge Information Items Discharge Pain Level: 0 Discharge Condition: Stable Ambulatory Status: Ambulatory Discharge Destination: Home Transportation: Private Auto Accompanied By: husband Schedule Follow-up Appointment: Yes Medication Reconciliation completed and provided to Patient/Care No Carmello Cabiness: Provided on Clinical Summary of Care: 05/19/2016 Form Type Recipient Paper Patient TM Electronic Signature(s) Signed: 05/19/2016 4:33:22 PM By: Alejandro Mulling Previous Signature: 05/19/2016 11:33:14 AM Version By: Gwenlyn Perking Entered By: Alejandro Mulling on 05/19/2016 11:40:53 [name] ([id number]) -------------------------------------------------------------------------------- Lower Extremity Assessment Details Patient Name: [name] Date of Service: 05/19/2016 10:30 AM Medical Record Number:  [id number] Patient Account Number: [id number] Date of Birth/Sex: 05-Mar-1940 (76 y.o. Female) Treating RN: Ashok Cordia, Debi Primary Care Aphrodite Harpenau: Schuyler Amor Other Clinician: Referring Keldric Poyer: Payton Mccallum Treating Lurlene Ronda/Extender: Rudene Re in Treatment: 0 Edema Assessment Assessed: [Left: No] [Right: No] E[Left: dema] [Right: :] Calf Left: Right: Point of Measurement: 34 cm From Medial Instep 39.8 cm cm Ankle Left: Right: Point of Measurement: 9 cm From Medial Instep 22 cm cm Vascular Assessment Pulses: Dorsalis Pedis Palpable: [Left:No] Posterior Tibial Extremity colors, hair growth, and conditions: Extremity Color: [Left:Hyperpigmented] Temperature of Extremity: [Left:Cool] Capillary Refill: [Left:> 3 seconds] Notes L ABI noncompressible Electronic Signature(s) Signed: 05/19/2016 4:33:22 PM By: Alejandro Mulling Entered By: Alejandro Mulling on 05/19/2016 10:47:05 [name] ([id number]) -------------------------------------------------------------------------------- Multi Wound Chart Details Patient Name: [name] Date of Service: 05/19/2016 10:30 AM Medical Record Number: [id number] Patient Account Number: [id number] Date of Birth/Sex: 1940-11-22 (76 y.o. Female) Treating RN: Ashok Cordia, Debi Primary Care Trentan Trippe: Schuyler Amor Other Clinician: Referring Karlo Goeden: Payton Mccallum Treating Fronnie Urton/Extender: Rudene Re in Treatment: 0 Vital Signs Height(in): 71 Pulse(bpm): 59 Weight(lbs): 212.2 Blood Pressure 168/78 (mmHg): Body Mass Index(BMI): 30 Temperature(F): 97.7 Respiratory Rate 18 (breaths/min): Photos: [1:No Photos] [N/A:N/A] Wound Location: [1:Left Foot - Lateral] [N/A:N/A] Wounding Event: [1:Gradually Appeared] [N/A:N/A] Primary Etiology: [1:Diabetic Wound/Ulcer of the Lower Extremity] [N/A:N/A] Comorbid History: [1:Type II Diabetes] [N/A:N/A] Date Acquired: [1:04/28/2016] [N/A:N/A] Weeks of Treatment:  [1:0] [N/A:N/A] Wound Status: [1:Open] [N/A:N/A] Measurements L x W x D 4x4.5x0.1 [N/A:N/A] (cm) Area (cm) : [1:14.137] [N/A:N/A] Volume (cm) : [1:1.414] [N/A:N/A] Classification: [1:Grade 1] [N/A:N/A] Exudate Amount: [1:Large] [N/A:N/A] Exudate Type: [1:Serous] [N/A:N/A] Exudate Color: [1:amber] [N/A:N/A] Wound Margin: [1:Distinct, outline attached] [N/A:N/A] Granulation Amount: [1:None Present (0%)] [N/A:N/A] Necrotic Amount: [1:Large (67-100%)] [N/A:N/A] Necrotic Tissue: [1:Eschar, Adherent Slough] [N/A:N/A] Epithelialization: [1:None] [N/A:N/A] Debridement: [1:Debridement (53664- [id number])] [N/A:N/A] Pre-procedure [1:11:07] [N/A:N/A] Verification/Time Out Taken: Pain Control: [1:Lidocaine 4% Topical Solution] [N/A:N/A] Tissue Debrided: [N/A:N/A] Fibrin/Slough, Exudates, Subcutaneous Level: Skin/Subcutaneous N/A N/A Tissue Debridement Area (sq 18 N/A N/A cm): Instrument: Forceps, Scissors N/A N/A Bleeding: Minimum N/A N/A Hemostasis Achieved: Pressure N/A N/A Procedural Pain: 0 N/A N/A Post Procedural Pain: 0 N/A N/A Debridement Treatment Procedure was tolerated N/A N/A Response: well Post Debridement 3.4x4x0.3 N/A N/A Measurements L x W x D (cm) Post Debridement 3.204 N/A N/A Volume: (cm) Periwound Skin Texture: No Abnormalities Noted N/A N/A Periwound Skin No Abnormalities Noted N/A N/A Moisture: Periwound Skin Color: No Abnormalities Noted N/A N/A Temperature: No Abnormality N/A N/A  Tenderness on Yes N/A N/A Palpation: Wound Preparation: Ulcer Cleansing: N/A N/A Rinsed/Irrigated with Saline Topical Anesthetic Applied: Other: lidocaine 4% Procedures Performed: Debridement N/A N/A Treatment Notes Wound #1 (Left, Medial Foot) 1. Cleansed with: Clean wound with Normal Saline 2. Anesthetic Topical Lidocaine 4% cream to wound bed prior to debridement 4. Dressing Applied: Aquacel Ag 5. Secondary Dressing Applied ABD Pad Kerlix/Conform 7. Secured  with Tape Notes [name], [name] ([id number]) netting Electronic Signature(s) Signed: 05/19/2016 12:07:30 PM By: Evlyn Kanner MD, FACS Entered By: Evlyn Kanner on 05/19/2016 12:07:30 [name] ([id number]) -------------------------------------------------------------------------------- Multi-Disciplinary Care Plan Details Patient Name: [name] Date of Service: 05/19/2016 10:30 AM Medical Record Number: [id number] Patient Account Number: [id number] Date of Birth/Sex: 1940-08-09 (76 y.o. Female) Treating RN: Ashok Cordia, Debi Primary Care Brenleigh Collet: Schuyler Amor Other Clinician: Referring Zohair Epp: Payton Mccallum Treating Nichael Ehly/Extender: Rudene Re in Treatment: 0 Active Inactive ` Orientation to the Wound Care Program Nursing Diagnoses: Knowledge deficit related to the wound healing center program Goals: Patient/caregiver will verbalize understanding of the Wound Healing Center Program Date Initiated: 05/19/2016 Target Resolution Date: 06/11/2016 Goal Status: Active Interventions: Provide education on orientation to the wound center Notes: ` Pain, Acute or Chronic Nursing Diagnoses: Pain, acute or chronic: actual or potential Potential alteration in comfort, pain Goals: Patient/caregiver will verbalize adequate pain control between visits Date Initiated: 05/19/2016 Target Resolution Date: 08/13/2016 Goal Status: Active Interventions: Assess comfort goal upon admission Complete pain assessment as per visit requirements Notes: ` Wound/Skin Impairment Nursing Diagnoses: [name], [name] ([id number]) Impaired tissue integrity Knowledge deficit related to ulceration/compromised skin integrity Goals: Ulcer/skin breakdown will have a volume reduction of 80% by week 12 Date Initiated: 05/19/2016 Target Resolution Date: 09/03/2016 Goal Status: Active Interventions: Assess patient/caregiver ability to perform ulcer/skin care regimen upon  admission and as needed Assess ulceration(s) every visit Notes: Electronic Signature(s) Signed: 05/19/2016 4:33:22 PM By: Alejandro Mulling Entered By: Alejandro Mulling on 05/19/2016 11:03:34 [name] ([id number]) -------------------------------------------------------------------------------- Pain Assessment Details Patient Name: [name] Date of Service: 05/19/2016 10:30 AM Medical Record Number: [id number] Patient Account Number: [id number] Date of Birth/Sex: 1940-10-01 (76 y.o. Female) Treating RN: Ashok Cordia, Debi Primary Care Ivey Nembhard: Schuyler Amor Other Clinician: Referring Carrie Usery: Payton Mccallum Treating Montre Harbor/Extender: Rudene Re in Treatment: 0 Active Problems Location of Pain Severity and Description of Pain Patient Has Paino No Site Locations With Dressing Change: No Pain Management and Medication Current Pain Management: Electronic Signature(s) Signed: 05/19/2016 4:33:22 PM By: Alejandro Mulling Entered By: Alejandro Mulling on 05/19/2016 10:30:07 [name] ([id number]) -------------------------------------------------------------------------------- Patient/Caregiver Education Details Patient Name: [name] Date of Service: 05/19/2016 10:30 AM Medical Record Number: [id number] Patient Account Number: [id number] Date of Birth/Gender: 14-Aug-1940 (76 y.o. Female) Treating RN: Ashok Cordia, Debi Primary Care Physician: Schuyler Amor Other Clinician: Referring Physician: Payton Mccallum Treating Physician/Extender: Rudene Re in Treatment: 0 Education Assessment Education Provided To: Patient Education Topics Provided Welcome To The Wound Care Center: Handouts: Welcome To The Wound Care Center Methods: Explain/Verbal Responses: State content correctly Wound/Skin Impairment: Handouts: Other: change dressing as ordered Methods: Demonstration, Explain/Verbal Responses: State content correctly Electronic  Signature(s) Signed: 05/19/2016 4:33:22 PM By: Alejandro Mulling Entered By: Alejandro Mulling on 05/19/2016 11:41:21 [name] ([id number]) -------------------------------------------------------------------------------- Wound Assessment Details Patient Name: [name] Date of Service: 05/19/2016 10:30 AM Medical Record Number: [id number] Patient Account Number: [id number] Date of Birth/Sex: August 03, 1940 (76 y.o. Female) Treating RN: Ashok Cordia, Debi Primary Care Hajime Asfaw: Schuyler Amor Other Clinician: Referring Montee Tallman: Payton Mccallum Treating Tiannah Greenly/Extender: Rudene Re in Treatment: 0 Wound Status Wound  Number: 1 Primary Diabetic Wound/Ulcer of the Lower Etiology: Extremity Wound Location: Left Foot - Lateral Wound Status: Open Wounding Event: Gradually Appeared Comorbid Type II Diabetes Date Acquired: 04/28/2016 History: Weeks Of Treatment: 0 Clustered Wound: No Photos Photo Uploaded By: Alejandro Mulling on 05/19/2016 16:03:58 Wound Measurements Length: (cm) 4 Width: (cm) 4.5 Depth: (cm) 0.1 Area: (cm) 14.137 Volume: (cm) 1.414 % Reduction in Area: % Reduction in Volume: Epithelialization: None Tunneling: No Undermining: No Wound Description Classification: Grade 1 Foul Odor Aft Wound Margin: Distinct, outline attached Slough/Fibrin Exudate Amount: Large Exudate Type: Serous Exudate Color: amber er Cleansing: No o Yes Wound Bed Granulation Amount: None Present (0%) Necrotic Amount: Large (67-100%) Necrotic Quality: Eschar, Adherent [address] Texture [name], [name] ([id number]) Texture Color No Abnormalities Noted: No No Abnormalities Noted: No Moisture Temperature / Pain No Abnormalities Noted: No Temperature: No Abnormality Tenderness on Palpation: Yes Wound Preparation Ulcer Cleansing: Rinsed/Irrigated with Saline Topical Anesthetic Applied: Other: lidocaine 4%, Electronic Signature(s) Signed:  05/19/2016 4:33:22 PM By: Alejandro Mulling Entered By: Alejandro Mulling on 05/19/2016 10:50:40 [name] ([id number]) -------------------------------------------------------------------------------- Vitals Details Patient Name: [name] Date of Service: 05/19/2016 10:30 AM Medical Record Number: [id number] Patient Account Number: [id number] Date of Birth/Sex: 10/12/1940 (76 y.o. Female) Treating RN: Ashok Cordia, Debi Primary Care Mireille Lacombe: Schuyler Amor Other Clinician: Referring Niana Martorana: Payton Mccallum Treating Annasophia Crocker/Extender: Rudene Re in Treatment: 0 Vital Signs Time Taken: 10:30 Temperature (F): 97.7 Height (in): 71 Pulse (bpm): 59 Source: Stated Respiratory Rate (breaths/min): 18 Weight (lbs): 212.2 Blood Pressure (mmHg): 168/78 Source: Measured Reference Range: 80 - 120 mg / dl Body Mass Index (BMI): 29.6 Electronic Signature(s) Signed: 05/19/2016 4:33:22 PM By: Alejandro Mulling Entered By: Alejandro Mulling on 05/19/2016 10:32:43

## 2016-05-23 ENCOUNTER — Ambulatory Visit (INDEPENDENT_AMBULATORY_CARE_PROVIDER_SITE_OTHER): Payer: Medicare Other | Admitting: Family Medicine

## 2016-05-23 ENCOUNTER — Encounter: Payer: Self-pay | Admitting: Family Medicine

## 2016-05-23 VITALS — BP 138/80 | HR 74 | Resp 16 | Ht 71.0 in | Wt 198.0 lb

## 2016-05-23 DIAGNOSIS — E1151 Type 2 diabetes mellitus with diabetic peripheral angiopathy without gangrene: Secondary | ICD-10-CM | POA: Insufficient documentation

## 2016-05-23 DIAGNOSIS — L97429 Non-pressure chronic ulcer of left heel and midfoot with unspecified severity: Secondary | ICD-10-CM

## 2016-05-23 DIAGNOSIS — R413 Other amnesia: Secondary | ICD-10-CM | POA: Diagnosis not present

## 2016-05-23 DIAGNOSIS — E1169 Type 2 diabetes mellitus with other specified complication: Secondary | ICD-10-CM | POA: Insufficient documentation

## 2016-05-23 DIAGNOSIS — L03116 Cellulitis of left lower limb: Secondary | ICD-10-CM

## 2016-05-23 DIAGNOSIS — E1165 Type 2 diabetes mellitus with hyperglycemia: Secondary | ICD-10-CM

## 2016-05-23 DIAGNOSIS — L97529 Non-pressure chronic ulcer of other part of left foot with unspecified severity: Secondary | ICD-10-CM

## 2016-05-23 DIAGNOSIS — E11621 Type 2 diabetes mellitus with foot ulcer: Secondary | ICD-10-CM

## 2016-05-23 DIAGNOSIS — R600 Localized edema: Secondary | ICD-10-CM | POA: Diagnosis not present

## 2016-05-23 DIAGNOSIS — E785 Hyperlipidemia, unspecified: Secondary | ICD-10-CM | POA: Diagnosis not present

## 2016-05-23 DIAGNOSIS — E663 Overweight: Secondary | ICD-10-CM | POA: Diagnosis not present

## 2016-05-23 DIAGNOSIS — L97409 Non-pressure chronic ulcer of unspecified heel and midfoot with unspecified severity: Secondary | ICD-10-CM | POA: Diagnosis not present

## 2016-05-23 DIAGNOSIS — E559 Vitamin D deficiency, unspecified: Secondary | ICD-10-CM

## 2016-05-23 DIAGNOSIS — E538 Deficiency of other specified B group vitamins: Secondary | ICD-10-CM | POA: Diagnosis not present

## 2016-05-23 DIAGNOSIS — IMO0001 Reserved for inherently not codable concepts without codable children: Secondary | ICD-10-CM

## 2016-05-23 DIAGNOSIS — E118 Type 2 diabetes mellitus with unspecified complications: Secondary | ICD-10-CM | POA: Insufficient documentation

## 2016-05-23 HISTORY — DX: Vitamin D deficiency, unspecified: E55.9

## 2016-05-23 HISTORY — DX: Non-pressure chronic ulcer of other part of left foot with unspecified severity: L97.529

## 2016-05-23 HISTORY — DX: Type 2 diabetes mellitus with foot ulcer: E11.621

## 2016-05-23 HISTORY — DX: Cellulitis of left lower limb: L03.116

## 2016-05-23 MED ORDER — ROSUVASTATIN CALCIUM 20 MG PO TABS: 20.0000 mg | ORAL_TABLET | Freq: Every day | ORAL | 3 refills | Status: DC

## 2016-05-23 MED ORDER — SITAGLIPTIN PHOS-METFORMIN HCL 50-500 MG PO TABS: 1.0000 | ORAL_TABLET | Freq: Two times a day (BID) | ORAL | 3 refills | Status: DC

## 2016-05-23 NOTE — Progress Notes (Signed)
Date:  05/23/2016   Name:  [name]   DOB:  02-Jan-1941   MRN:  [id number]  PCP:  Schuyler Amor, MD    Chief Complaint: Establish Care; Wound Infection (Following wound clinic thursdsy. ); and Diabetes (Not checking BS )   History of Present Illness:  This is a 76 y.o. female seen for initial visit. Moved from [place] 7 months ago. T2DM for years on Janumet but taking only once daily, not checking sugars, unsure of last a1c. HLD on Crestor, unsure of last lipids, Takes asa daily and vit C, vit D, B12, and zinc but unsure of doses. Denies renal, cardiac, or neuro complications. Seen MUC last week with infected diabetic ulcer L foot, still on Augmentin, improving per husband, wound clinic following. Has some chronic RLE edema, wears support hose. Admits chronic poor ST memory. Declines all imms, had nl colonoscopy age 3, last mammo yrs ago. Father and mother died of old age in 39's, brother died of unknown cancer in 59's.  Review of Systems:  Review of Systems  Constitutional: Negative for chills and fever.  HENT: Negative for ear pain and sinus pain.   Eyes: Negative for pain.  Respiratory: Negative for cough and shortness of breath.   Cardiovascular: Negative for chest pain.  Gastrointestinal: Negative for abdominal pain, constipation and diarrhea.  Endocrine: Negative for polydipsia and polyuria.  Genitourinary: Negative for difficulty urinating and dysuria.  Musculoskeletal: Negative for myalgias.  Neurological: Negative for syncope and light-headedness.  Hematological: Negative for adenopathy.    Patient Active Problem List   Diagnosis Date Noted  . Type 2 diabetes, controlled, with ulcer of midfoot (HCC) 05/23/2016  . Cellulitis of left foot 05/23/2016  . Hyperlipidemia 05/23/2016  . Vitamin D deficiency 05/23/2016  . B12 deficiency 05/23/2016  . Short-term memory loss 05/23/2016  . Overweight (BMI 25.0-29.9) 05/23/2016    Prior to Admission medications   Medication  Sig Start Date End Date Taking? Authorizing Provider  aspirin 81 MG tablet Take 81 mg by mouth daily.   Yes Historical Provider, MD  cholecalciferol (VITAMIN D) 1000 units tablet Take 1,000 Units by mouth daily.   Yes Historical Provider, MD  cyanocobalamin 1000 MCG tablet Take 1,000 mcg by mouth daily.   Yes Historical Provider, MD  Multiple Vitamin (MULTIVITAMIN) capsule Take 1 capsule by mouth daily.   Yes Historical Provider, MD  rosuvastatin (CRESTOR) 20 MG tablet Take 1 tablet (20 mg total) by mouth daily. 05/23/16  Yes Schuyler Amor, MD  sitaGLIPtin-metformin (JANUMET) 50-500 MG tablet Take 1 tablet by mouth 2 (two) times daily with a meal. 05/23/16  Yes Schuyler Amor, MD  vitamin C (ASCORBIC ACID) 250 MG tablet Take 250 mg by mouth daily.   Yes Historical Provider, MD  zinc sulfate 220 (50 Zn) MG capsule Take 220 mg by mouth daily.   Yes Historical Provider, MD    No Known Allergies  History reviewed. No pertinent surgical history.  Social History  Substance Use Topics  . Smoking status: Former Games developer  . Smokeless tobacco: Never Used  . Alcohol use Yes    History reviewed. No pertinent family history.  Medication list has been reviewed and updated.  Physical Examination: BP 138/80   Pulse 74   Resp 16   Ht  (1.803 m)   Wt 198 lb (89.8 kg)   SpO2 98%   BMI 27.62 kg/m   Physical Exam  Constitutional: She is oriented to person, place, and time. She appears well-developed and  well-nourished.  HENT:  Head: Normocephalic and atraumatic.  Right Ear: External ear normal.  Left Ear: External ear normal.  Nose: Nose normal.  Mouth/Throat: Oropharynx is clear and moist.  TMs clear  Eyes: Conjunctivae and EOM are normal. Pupils are equal, round, and reactive to light.  Neck: Neck supple. No thyromegaly present.  Cardiovascular: Normal rate, regular rhythm and normal heart sounds.   Pulmonary/Chest: Effort normal and breath sounds normal.  Abdominal: Soft. She exhibits  no distension and no mass. There is no tenderness.  Musculoskeletal:  1+ edema RLE LLE dressed with gauze  Lymphadenopathy:    She has no cervical adenopathy.  Neurological: She is alert and oriented to person, place, and time. Coordination normal.  Romberg negative, gait sl unsteady but wearing L slipper Clock drawing intact, 1/3 recall   Skin: Skin is warm and dry.  Psychiatric: She has a normal mood and affect. Her behavior is normal.  Nursing note and vitals reviewed.   Assessment and Plan:  1. Uncontrolled type 2 diabetes mellitus without complication, without long-term current use of insulin (HCC) Unclear control, taking Janumet only once daily - Comprehensive Metabolic Panel (CMET) - CBC - Urine Microalbumin w/creat. ratio - HgB A1c  2. Diabetic ulcer of left midfoot associated with type 2 diabetes mellitus, unspecified ulcer stage (HCC) Improving, wound clinic following  3. Cellulitis of left foot Improving on Augmentin  4. Hyperlipidemia, unspecified hyperlipidemia type Unclear control on Crestor, cont asa - Lipid Profile  5. Short-term memory loss Unclear etiology, consider cognitive testing next visit - TSH  6. Vitamin D deficiency On supplement, dose unknown - Vitamin D (25 hydroxy)  7. B12 deficiency On supplement, dose unknown - B12  8. Overweight (BMI 25.0-29.9) Exercise/weight loss discussed  9. Edema of right lower extremity Stable, cont comp stocking  10. Med review Consider d/c vit C and zinc once wound healed  Return in about 4 weeks (around 06/20/2016).   45 minutes spent with patient over half in counseling  Dionne Ano. Kingsley Spittle MD [facility]  05/23/2016

## 2016-05-24 ENCOUNTER — Other Ambulatory Visit: Payer: Self-pay | Admitting: Family Medicine

## 2016-05-24 DIAGNOSIS — R809 Proteinuria, unspecified: Secondary | ICD-10-CM

## 2016-05-24 LAB — COMPREHENSIVE METABOLIC PANEL
ALBUMIN: 4.4 g/dL (ref 3.5–4.8)
ALK PHOS: 96 IU/L (ref 39–117)
ALT: 19 IU/L (ref 0–32)
AST: 21 IU/L (ref 0–40)
Albumin/Globulin Ratio: 1.3 (ref 1.2–2.2)
BILIRUBIN TOTAL: 0.3 mg/dL (ref 0.0–1.2)
BUN / CREAT RATIO: 16 (ref 12–28)
BUN: 16 mg/dL (ref 8–27)
CO2: 21 mmol/L (ref 18–29)
CREATININE: 0.99 mg/dL (ref 0.57–1.00)
Calcium: 10.3 mg/dL (ref 8.7–10.3)
Chloride: 101 mmol/L (ref 96–106)
GFR calc non Af Amer: 56 mL/min/{1.73_m2} — ABNORMAL LOW (ref >59)
GFR, EST AFRICAN AMERICAN: 64 mL/min/{1.73_m2} (ref >59)
GLOBULIN, TOTAL: 3.5 g/dL (ref 1.5–4.5)
Glucose: 170 mg/dL — ABNORMAL HIGH (ref 65–99)
Potassium: 4.7 mmol/L (ref 3.5–5.2)
SODIUM: 140 mmol/L (ref 134–144)
TOTAL PROTEIN: 7.9 g/dL (ref 6.0–8.5)

## 2016-05-24 LAB — VITAMIN D 25 HYDROXY (VIT D DEFICIENCY, FRACTURES): Vit D, 25-Hydroxy: 13.1 ng/mL — ABNORMAL LOW (ref 30.0–100.0)

## 2016-05-24 LAB — LIPID PANEL
CHOL/HDL RATIO: 4.6 ratio — AB (ref 0.0–4.4)
Cholesterol, Total: 213 mg/dL — ABNORMAL HIGH (ref 100–199)
HDL: 46 mg/dL (ref >39)
LDL CALC: 130 mg/dL — AB (ref 0–99)
Triglycerides: 186 mg/dL — ABNORMAL HIGH (ref 0–149)
VLDL CHOLESTEROL CAL: 37 mg/dL (ref 5–40)

## 2016-05-24 LAB — VITAMIN B12: Vitamin B-12: 659 pg/mL (ref 232–1245)

## 2016-05-24 LAB — CBC
HEMATOCRIT: 39.9 % (ref 34.0–46.6)
Hemoglobin: 13.2 g/dL (ref 11.1–15.9)
MCH: 27.8 pg (ref 26.6–33.0)
MCHC: 33.1 g/dL (ref 31.5–35.7)
MCV: 84 fL (ref 79–97)
PLATELETS: 233 10*3/uL (ref 150–379)
RBC: 4.75 x10E6/uL (ref 3.77–5.28)
RDW: 14.7 % (ref 12.3–15.4)
WBC: 6 10*3/uL (ref 3.4–10.8)

## 2016-05-24 LAB — MICROALBUMIN / CREATININE URINE RATIO
Creatinine, Urine: 136.2 mg/dL
MICROALB/CREAT RATIO: 103.7 mg/g{creat} — AB (ref 0.0–30.0)
Microalbumin, Urine: 141.3 ug/mL

## 2016-05-24 LAB — HEMOGLOBIN A1C
ESTIMATED AVERAGE GLUCOSE: 237 mg/dL
HEMOGLOBIN A1C: 9.9 % — AB (ref 4.8–5.6)

## 2016-05-24 LAB — TSH: TSH: 1.42 u[IU]/mL (ref 0.450–4.500)

## 2016-05-24 MED ORDER — LISINOPRIL 5 MG PO TABS: 5.0000 mg | ORAL_TABLET | Freq: Every day | ORAL | 2 refills | Status: DC

## 2016-05-24 NOTE — Progress Notes (Signed)
lisinopril 

## 2016-05-26 ENCOUNTER — Encounter: Payer: Medicare Other | Admitting: Surgery

## 2016-05-26 DIAGNOSIS — L97522 Non-pressure chronic ulcer of other part of left foot with fat layer exposed: Secondary | ICD-10-CM | POA: Diagnosis not present

## 2016-05-26 DIAGNOSIS — I87312 Chronic venous hypertension (idiopathic) with ulcer of left lower extremity: Secondary | ICD-10-CM | POA: Diagnosis not present

## 2016-05-26 DIAGNOSIS — L97322 Non-pressure chronic ulcer of left ankle with fat layer exposed: Secondary | ICD-10-CM | POA: Diagnosis not present

## 2016-05-26 DIAGNOSIS — Z7984 Long term (current) use of oral hypoglycemic drugs: Secondary | ICD-10-CM | POA: Diagnosis not present

## 2016-05-26 DIAGNOSIS — Z87891 Personal history of nicotine dependence: Secondary | ICD-10-CM | POA: Diagnosis not present

## 2016-05-26 DIAGNOSIS — E11621 Type 2 diabetes mellitus with foot ulcer: Secondary | ICD-10-CM | POA: Diagnosis not present

## 2016-05-26 DIAGNOSIS — I739 Peripheral vascular disease, unspecified: Secondary | ICD-10-CM | POA: Diagnosis not present

## 2016-05-27 NOTE — Progress Notes (Signed)
[name] ([id number]) Visit Report for 05/26/2016 Arrival Information Details Patient Name: [name] Date of Service: 05/26/2016 9:45 AM Medical Record Number: [id number] Patient Account Number: [id number] Date of Birth/Sex: [birth year] (76 y.o. Female) Treating RN: [name], [name] Primary Care [name]: [name] Other Clinician: Referring [name]: [name] Treating [name]/Extender: [name] in Treatment: 1 Visit Information History Since Last Visit All ordered tests and consults were completed: No Patient Arrived: Ambulatory Added or deleted any medications: No Arrival Time: 09:33 Any new allergies or adverse reactions: No Accompanied By: husband Had a fall or experienced change in No Transfer Assistance: None activities of daily living that may affect Patient Requires Transmission- No risk of falls: Based Precautions: Signs or symptoms of abuse/neglect since last No Patient Has Alerts: Yes visito Patient Alerts: DM II Hospitalized since last visit: No L ABI Has Dressing in Place as Prescribed: Yes noncompressible Pain Present Now: No Electronic Signature(s) Signed: 05/26/2016 4:17:25 PM By: [name] Entered By: [name] on 05/26/2016 09:35:08 [name] ([id number]) -------------------------------------------------------------------------------- Clinic Level of Care Assessment Details Patient Name: [name] Date of Service: 05/26/2016 9:45 AM Medical Record Number: [id number] Patient Account Number: [id number] Date of Birth/Sex: 03-31-1940 (76 y.o. Female) Treating RN: [name], [name] Primary Care Ishanvi Mcquitty: [name] Other Clinician: Referring Ciella Obi: [name] Treating [name]/Extender: [name] in Treatment: 1 Clinic Level of Care Assessment Items TOOL 4 Quantity Score X - Use when only an EandM is performed on FOLLOW-UP visit 1 0 ASSESSMENTS - Nursing Assessment  / Reassessment X - Reassessment of Co-morbidities (includes updates in patient status) 1 10 X - Reassessment of Adherence to Treatment Plan 1 5 ASSESSMENTS - Wound and Skin Assessment / Reassessment X - Simple Wound Assessment / Reassessment - one wound 1 5  - Complex Wound Assessment / Reassessment - multiple wounds 0  - Dermatologic / Skin Assessment (not related to wound area) 0 ASSESSMENTS - Focused Assessment  - Circumferential Edema Measurements - multi extremities 0  - Nutritional Assessment / Counseling / Intervention 0  - Lower Extremity Assessment (monofilament, tuning fork, pulses) 0  - Peripheral Arterial Disease Assessment (using hand held doppler) 0 ASSESSMENTS - Ostomy and/or Continence Assessment and Care  - Incontinence Assessment and Management 0  - Ostomy Care Assessment and Management (repouching, etc.) 0 PROCESS - Coordination of Care X - Simple Patient / Family Education for ongoing care 1 15  - Complex (extensive) Patient / Family Education for ongoing care 0  - Staff obtains Chiropractor, Records, Test Results / Process Orders 0  - Staff telephones HHA, Nursing Homes / Clarify orders / etc 0  - Routine Transfer to another Facility (non-emergent condition) 0 [place], [name] ([id number])  - Routine Hospital Admission (non-emergent condition) 0  - New Admissions / Manufacturing engineer / Ordering NPWT, Apligraf, etc. 0  - Emergency Hospital Admission (emergent condition) 0 X - Simple Discharge Coordination 1 10  - Complex (extensive) Discharge Coordination 0 PROCESS - Special Needs  - Pediatric / Minor Patient Management 0  - Isolation Patient Management 0  - Hearing / Language / Visual special needs 0  - Assessment of Community assistance (transportation, D/C planning, etc.) 0  - Additional assistance / Altered mentation 0  - Support Surface(s) Assessment (bed, cushion, seat, etc.) 0 INTERVENTIONS - Wound Cleansing  / Measurement X - Simple Wound Cleansing - one wound 1 5  - Complex Wound Cleansing - multiple wounds 0 X - Wound Imaging (photographs - any number of wounds) 1 5  -  Wound Tracing (instead of photographs) 0 X - Simple Wound Measurement - one wound 1 5  - Complex Wound Measurement - multiple wounds 0 INTERVENTIONS - Wound Dressings  - Small Wound Dressing one or multiple wounds 0 X - Medium Wound Dressing one or multiple wounds 1 15  - Large Wound Dressing one or multiple wounds 0 X - Application of Medications - topical 1 5  - Application of Medications - injection 0 INTERVENTIONS - Miscellaneous  - External ear exam 0 [place], [name] ([id number])  - Specimen Collection (cultures, biopsies, blood, body fluids, etc.) 0  - Specimen(s) / Culture(s) sent or taken to Lab for analysis 0  - Patient Transfer (multiple staff / Michiel Sites Lift / Similar devices) 0  - Simple Staple / Suture removal (25 or less) 0  - Complex Staple / Suture removal (26 or more) 0  - Hypo / Hyperglycemic Management (close monitor of Blood Glucose) 0  - Ankle / Brachial Index (ABI) - do not check if billed separately 0 X - Vital Signs 1 5 Has the patient been seen at the hospital within the last three years: Yes Total Score: 85 Level Of Care: New/Established - Level 3 Electronic Signature(s) Signed: 05/26/2016 4:17:25 PM By: [name] Entered By: [name] on 05/26/2016 12:36:08 [name] ([id number]) -------------------------------------------------------------------------------- Encounter Discharge Information Details Patient Name: [name] Date of Service: 05/26/2016 9:45 AM Medical Record Number: [id number] Patient Account Number: [id number] Date of Birth/Sex: Apr 29, 1940 (76 y.o. Female) Treating RN: [name], [name] Primary Care Avani Sensabaugh: [name] Other Clinician: Referring Lizeth Bencosme: [name] Treating Jeremi Losito/Extender: [name] in Treatment: 1 Encounter Discharge Information Items Discharge Pain Level: 0 Discharge Condition: Stable Ambulatory Status: Ambulatory Discharge Destination: Home Transportation: Private Auto Accompanied By: husband Schedule Follow-up Appointment: Yes Medication Reconciliation completed and provided to Patient/Care No Leeasia Secrist: Provided on Clinical Summary of Care: 05/26/2016 Form Type Recipient Paper Patient TM Electronic Signature(s) Signed: 05/26/2016 10:00:08 AM By: Gwenlyn Perking Entered By: Gwenlyn Perking on 05/26/2016 10:00:08 [name] ([id number]) -------------------------------------------------------------------------------- Lower Extremity Assessment Details Patient Name: [name] Date of Service: 05/26/2016 9:45 AM Medical Record Number: [id number] Patient Account Number: [id number] Date of Birth/Sex: Jan 10, 1941 (76 y.o. Female) Treating RN: [name], [name] Primary Care Paxtyn Boyar: [name] Other Clinician: Referring Dorothee Napierkowski: [name] Treating Carlin Attridge/Extender: [name] in Treatment: 1 Vascular Assessment Pulses: Dorsalis Pedis Palpable: [Left:No] Doppler Audible: [Left:Yes] Posterior Tibial Extremity colors, hair growth, and conditions: Extremity Color: [Left:Hyperpigmented] Temperature of Extremity: [Left:Warm] Capillary Refill: [Left:> 3 seconds] Toe Nail Assessment Left: Right: Thick: Yes Discolored: Yes Deformed: Yes Improper Length and Hygiene: No Electronic Signature(s) Signed: 05/26/2016 4:17:25 PM By: [name] Entered By: [name] on 05/26/2016 09:42:48 [name] ([id number]) -------------------------------------------------------------------------------- Multi Wound Chart Details Patient Name: [name] Date of Service: 05/26/2016 9:45 AM Medical Record Number: [id number] Patient Account Number: [id number] Date of Birth/Sex: 12-07-1940 (76 y.o.  Female) Treating RN: [name], [name] Primary Care Sheletha Bow: [name] Other Clinician: Referring Aly Seidenberg: [name] Treating Souleymane Saiki/Extender: [name] in Treatment: 1 Vital Signs Height(in): 71 Pulse(bpm): 56 Weight(lbs): 212.2 Blood Pressure 149/75 (mmHg): Body Mass Index(BMI): 30 Temperature(F): 97.8 Respiratory Rate 18 (breaths/min): Photos: [1:No Photos] [N/A:N/A] Wound Location: [1:Left Foot - Medial] [N/A:N/A] Wounding Event: [1:Gradually Appeared] [N/A:N/A] Primary Etiology: [1:Diabetic Wound/Ulcer of the Lower Extremity] [N/A:N/A] Comorbid History: [1:Type II Diabetes] [N/A:N/A] Date Acquired: [1:04/28/2016] [N/A:N/A] Weeks of Treatment: [1:1] [N/A:N/A] Wound Status: [1:Open] [N/A:N/A] Measurements L x W x D 3x2x0.1 [N/A:N/A] (cm) Area (cm) : [1:4.712] [N/A:N/A] Volume (cm) : [  1:0.471] [N/A:N/A] % Reduction in Area: [1:66.70%] [N/A:N/A] % Reduction in Volume: 66.70% [N/A:N/A] Classification: [1:Grade 1] [N/A:N/A] Exudate Amount: [1:Large] [N/A:N/A] Exudate Type: [1:Serosanguineous] [N/A:N/A] Exudate Color: [1:red, brown] [N/A:N/A] Wound Margin: [1:Distinct, outline attached] [N/A:N/A] Granulation Amount: [1:Large (67-100%)] [N/A:N/A] Granulation Quality: [1:Red] [N/A:N/A] Necrotic Amount: [1:Small (1-33%)] [N/A:N/A] Necrotic Tissue: [1:Eschar, Adherent Slough] [N/A:N/A] Epithelialization: [1:None] [N/A:N/A] Periwound Skin Texture: No Abnormalities Noted [N/A:N/A] Periwound Skin [1:No Abnormalities Noted] [N/A:N/A] Moisture: Periwound Skin Color: No Abnormalities Noted [N/A:N/A] Temperature: No Abnormality N/A N/A Tenderness on Yes N/A N/A Palpation: Wound Preparation: Ulcer Cleansing: N/A N/A Rinsed/Irrigated with Saline Topical Anesthetic Applied: Other: lidocaine 4% Treatment Notes Wound #1 (Left, Medial Foot) 1. Cleansed with: Clean wound with Normal Saline 2. Anesthetic Topical Lidocaine 4% cream to wound bed prior  to debridement 4. Dressing Applied: Aquacel Ag 5. Secondary Dressing Applied ABD Pad Kerlix/Conform 7. Secured with Tape Notes netting Electronic Signature(s) Signed: 05/26/2016 10:01:58 AM By: Evlyn Kanner MD, FACS Entered By: Evlyn Kanner on 05/26/2016 10:01:58 [name] ([id number]) -------------------------------------------------------------------------------- Multi-Disciplinary Care Plan Details Patient Name: [name] Date of Service: 05/26/2016 9:45 AM Medical Record Number: [id number] Patient Account Number: [id number] Date of Birth/Sex: January 06, 1941 (75 y.o. Female) Treating RN: [name], [name] Primary Care Epifania Littrell: [name] Other Clinician: Referring Ashlee Bewley: [name] Treating Mance Vallejo/Extender: [name] in Treatment: 1 Active Inactive ` Orientation to the Wound Care Program Nursing Diagnoses: Knowledge deficit related to the wound healing center program Goals: Patient/caregiver will verbalize understanding of the Wound Healing Center Program Date Initiated: 05/19/2016 Target Resolution Date: 06/11/2016 Goal Status: Active Interventions: Provide education on orientation to the wound center Notes: ` Pain, Acute or Chronic Nursing Diagnoses: Pain, acute or chronic: actual or potential Potential alteration in comfort, pain Goals: Patient/caregiver will verbalize adequate pain control between visits Date Initiated: 05/19/2016 Target Resolution Date: 08/13/2016 Goal Status: Active Interventions: Assess comfort goal upon admission Complete pain assessment as per visit requirements Notes: ` Wound/Skin Impairment Nursing Diagnoses: NAOKO, DIPERNA ([id number]) Impaired tissue integrity Knowledge deficit related to ulceration/compromised skin integrity Goals: Ulcer/skin breakdown will have a volume reduction of 80% by week 12 Date Initiated: 05/19/2016 Target Resolution Date: 09/03/2016 Goal Status:  Active Interventions: Assess patient/caregiver ability to perform ulcer/skin care regimen upon admission and as needed Assess ulceration(s) every visit Notes: Electronic Signature(s) Signed: 05/26/2016 4:17:25 PM By: [name] Entered By: [name] on 05/26/2016 09:42:52 [name] ([id number]) -------------------------------------------------------------------------------- Pain Assessment Details Patient Name: [name] Date of Service: 05/26/2016 9:45 AM Medical Record Number: [id number] Patient Account Number: [id number] Date of Birth/Sex: 1940-10-06 (76 y.o. Female) Treating RN: [name], [name] Primary Care Liyat Faulkenberry: [name] Other Clinician: Referring Miracle Mongillo: [name] Treating Julya Alioto/Extender: [name] in Treatment: 1 Active Problems Location of Pain Severity and Description of Pain Patient Has Paino No Site Locations With Dressing Change: No Pain Management and Medication Current Pain Management: Electronic Signature(s) Signed: 05/26/2016 4:17:25 PM By: [name] Entered By: [name] on 05/26/2016 09:35:14 [name] ([id number]) -------------------------------------------------------------------------------- Patient/Caregiver Education Details Patient Name: [name] Date of Service: 05/26/2016 9:45 AM Medical Record Number: [id number] Patient Account Number: [id number] Date of Birth/Gender: 10-07-1940 (76 y.o. Female) Treating RN: [name], [name] Primary Care Physician: [name] Other Clinician: Referring Physician: Schuyler [name] Treating Physician/Extender: [name] in Treatment: 1 Education Assessment Education Provided To: Patient Education Topics Provided Wound/Skin Impairment: Handouts: Other: change dressing as ordered Methods: Demonstration, Explain/Verbal Responses: State content correctly Electronic Signature(s) Signed: 05/26/2016 4:17:25 PM By:  [name] Entered By: [name] on 05/26/2016  09:53:11 NEETA, STOREY ([id number]) -------------------------------------------------------------------------------- Wound Assessment Details Patient Name: EXA, BOMBA Date of Service: 05/26/2016 9:45 AM Medical Record Number: [id number] Patient Account Number: [id number] Date of Birth/Sex: 1940-11-25 (76 y.o. Female) Treating RN: [name], [name] Primary Care Breezie Micucci: [name] Other Clinician: Referring Dionis Autry: [name] Treating Gus Littler/Extender: [name] in Treatment: 1 Wound Status Wound Number: 1 Primary Diabetic Wound/Ulcer of the Lower Etiology: Extremity Wound Location: Left Foot - Medial Wound Status: Open Wounding Event: Gradually Appeared Comorbid Type II Diabetes Date Acquired: 04/28/2016 History: Weeks Of Treatment: 1 Clustered Wound: No Photos Photo Uploaded By: [name] on 05/26/2016 12:38:19 Wound Measurements Length: (cm) 3 Width: (cm) 2 Depth: (cm) 0.1 Area: (cm) 4.712 Volume: (cm) 0.471 % Reduction in Area: 66.7% % Reduction in Volume: 66.7% Epithelialization: None Tunneling: No Undermining: No Wound Description Classification: Grade 1 Foul Odor Aft Wound Margin: Distinct, outline attached Slough/Fibrin Exudate Amount: Large Exudate Type: Serosanguineous Exudate Color: red, brown er Cleansing: No o Yes Wound Bed Granulation Amount: Large (67-100%) Granulation Quality: Red Necrotic Amount: Small (1-33%) Necrotic Quality: Eschar, Adherent Stewart, Dea ([id number]) Periwound Skin Texture Texture Color No Abnormalities Noted: No No Abnormalities Noted: No Moisture Temperature / Pain No Abnormalities Noted: No Temperature: No Abnormality Tenderness on Palpation: Yes Wound Preparation Ulcer Cleansing: Rinsed/Irrigated with Saline Topical Anesthetic Applied: Other: lidocaine 4%, Treatment Notes Wound #1 (Left, Medial  Foot) 1. Cleansed with: Clean wound with Normal Saline 2. Anesthetic Topical Lidocaine 4% cream to wound bed prior to debridement 4. Dressing Applied: Aquacel Ag 5. Secondary Dressing Applied ABD Pad Kerlix/Conform 7. Secured with Tape Notes netting Electronic Signature(s) Signed: 05/26/2016 4:17:25 PM By: [name] Entered By: [name] on 05/26/2016 09:41:22 [name] ([id number]) -------------------------------------------------------------------------------- Vitals Details Patient Name: [name] Date of Service: 05/26/2016 9:45 AM Medical Record Number: [id number] Patient Account Number: [id number] Date of Birth/Sex: 09-11-1940 (76 y.o. Female) Treating RN: [name], [name] Primary Care Gadge Hermiz: [name] Other Clinician: Referring Tahlor Berenguer: [name] Treating Carlen Rebuck/Extender: [name] in Treatment: 1 Vital Signs Time Taken: 09:35 Temperature (F): 97.8 Height (in): 71 Pulse (bpm): 56 Weight (lbs): 212.2 Respiratory Rate (breaths/min): 18 Body Mass Index (BMI): 29.6 Blood Pressure (mmHg): 149/75 Reference Range: 80 - 120 mg / dl Electronic Signature(s) Signed: 05/26/2016 4:17:25 PM By: [name] Entered By: [name] on 05/26/2016 09:37:08

## 2016-05-27 NOTE — Progress Notes (Signed)
JISELLA, ASHENFELTER ([id number]) Visit Report for 05/26/2016 Chief Complaint Document Details Patient Name: [name], [name] Date of Service: 05/26/2016 9:45 AM Medical Record Number: [id number] Patient Account Number: [id number] Date of Birth/Sex: 10-25-1940 (76 y.o. Female) Treating RN: Ashok Cordia, Debi Primary Care Provider: Schuyler Amor Other Clinician: Referring Provider: Schuyler Amor Treating Provider/Extender: Rudene Re in Treatment: 1 Information Obtained from: Patient Chief Complaint Patients presents for treatment of an open diabetic ulcer to the left medial ankle which she's had for about 3 weeks Electronic Signature(s) Signed: 05/26/2016 10:02:06 AM By: Evlyn Kanner MD, FACS Entered By: Evlyn Kanner on 05/26/2016 10:02:05 [name] ([id number]) -------------------------------------------------------------------------------- HPI Details Patient Name: [name] Date of Service: 05/26/2016 9:45 AM Medical Record Number: [id number] Patient Account Number: [id number] Date of Birth/Sex: 07/08/1940 (76 y.o. Female) Treating RN: Ashok Cordia, Debi Primary Care Provider: Schuyler Amor Other Clinician: Referring Provider: Schuyler Amor Treating Provider/Extender: Rudene Re in Treatment: 1 History of Present Illness Location: left medial ankle and foot Quality: Patient reports experiencing a dull pain to affected area(s). Severity: Patient states wound are getting worse. Duration: Patient has had the wound for < 4 weeks prior to presenting for treatment Timing: Pain in wound is constant (hurts all the time) Context: The wound appeared gradually over time Modifying Factors: Other treatment(s) tried include:went to the ER recently where she was put on Augmentin Associated Signs and Symptoms: Patient reports having increase swelling. HPI Description: 76 year old patient known to have diabetes mellitus has a left foot ulcer for about 3  weeks which has been causing her pain with redness and drainage. She was recently seen at the urgent care facility where the patient had been placed on Augmentin and I do not find any x-ray was done. She was also given an intramuscular Ancef injection and asked to follow-up with the wound care. Culture showed normal skin flora. She is a former smoker She has recently moved from [place] and has not been taking treatment for her diabetes mellitus and though she wears compression stockings she has never been worked up for arterial or venous disease in the past Psychologist, [facility]) Signed: 05/26/2016 10:02:12 AM By: Evlyn Kanner MD, FACS Entered By: Evlyn Kanner on 05/26/2016 10:02:11 [name] ([id number]) -------------------------------------------------------------------------------- Physical Exam Details Patient Name: [name] Date of Service: 05/26/2016 9:45 AM Medical Record Number: [id number] Patient Account Number: [id number] Date of Birth/Sex: 1940-07-15 (76 y.o. Female) Treating RN: Ashok Cordia, Debi Primary Care Provider: Schuyler Amor Other Clinician: Referring Provider: Schuyler Amor Treating Provider/Extender: Rudene Re in Treatment: 1 Constitutional . Pulse regular. Respirations normal and unlabored. Afebrile. . Eyes Nonicteric. Reactive to light. Ears, Nose, Mouth, and Throat Lips, teeth, and gums WNL.Marland Kitchen Moist mucosa without lesions. Neck supple and nontender. No palpable supraclavicular or cervical adenopathy. Normal sized without goiter. Respiratory WNL. No retractions.. Breath sounds WNL, No rubs, rales, rhonchi, or wheeze.. Cardiovascular Heart rhythm and rate regular, no murmur or gallop.. Pedal Pulses WNL. No clubbing, cyanosis or edema. Chest Breasts symmetical and no nipple discharge.. Breast tissue WNL, no masses, lumps, or tenderness.. Lymphatic No adneopathy. No adenopathy. No adenopathy. Musculoskeletal Adexa without  tenderness or enlargement.. Digits and nails w/o clubbing, cyanosis, infection, petechiae, ischemia, or inflammatory conditions.. Integumentary (Hair, Skin) No suspicious lesions. No crepitus or fluctuance. No peri-wound warmth or erythema. No masses.Marland Kitchen Psychiatric Judgement and insight Intact.. No evidence of depression, anxiety, or agitation.. Notes the lymphedema looks better and the ulceration is little cleaner. No sharp debridement was required today. Electronic Signature(s) Signed: 05/26/2016 10:02:48 AM By:  Evlyn Kanner MD, FACS Entered By: Evlyn Kanner on 05/26/2016 10:02:47 [name] ([id number]) -------------------------------------------------------------------------------- Physician Orders Details Patient Name: [name] Date of Service: 05/26/2016 9:45 AM Medical Record Number: [id number] Patient Account Number: [id number] Date of Birth/Sex: 05-May-1940 (76 y.o. Female) Treating RN: Ashok Cordia, Debi Primary Care Provider: Schuyler Amor Other Clinician: Referring Provider: Schuyler Amor Treating Provider/Extender: Rudene Re in Treatment: 1 Verbal / Phone Orders: Yes Clinician: Pinkerton, Debi Read Back and Verified: Yes Diagnosis Coding Wound Cleansing Wound #1 Left,Medial Foot o Clean wound with Normal Saline. o May Shower, gently pat wound dry prior to applying new dressing. Anesthetic Wound #1 Left,Medial Foot o Topical Lidocaine 4% cream applied to wound bed prior to debridement Primary Wound Dressing Wound #1 Left,Medial Foot o Aquacel Ag Secondary Dressing Wound #1 Left,Medial Foot o ABD pad o Dry Gauze o Conform/Kerlix o Other - tape, stretch netting Dressing Change Frequency Wound #1 Left,Medial Foot o Change dressing every day. Follow-up Appointments Wound #1 Left,Medial Foot o Return Appointment in 1 week. Edema Control Wound #1 Left,Medial Foot o Elevate legs to the level of the heart and pump  ankles as often as possible o Other: - pt to wear own compression hose Additional Orders / Instructions Wound #1 Left,Medial Foot [name], [name] ([id number]) o Increase protein intake. Medications-please add to medication list. Wound #1 Left,Medial Foot o Other: - vitamin c, zinc, MVI Electronic Signature(s) Signed: 05/26/2016 4:16:12 PM By: Evlyn Kanner MD, FACS Signed: 05/26/2016 4:17:25 PM By: Alejandro Mulling Entered By: Alejandro Mulling on 05/26/2016 09:52:04 [name] ([id number]) -------------------------------------------------------------------------------- Problem List Details Patient Name: [name] Date of Service: 05/26/2016 9:45 AM Medical Record Number: [id number] Patient Account Number: [id number] Date of Birth/Sex: Sep 08, 1940 (76 y.o. 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 05/19/2016 Yes exposed I73.9 Peripheral vascular disease, unspecified 05/19/2016 Yes I87.312 Chronic venous hypertension (idiopathic) with ulcer of left 05/19/2016 Yes lower extremity Inactive Problems Resolved Problems Electronic Signature(s) Signed: 05/26/2016 10:01:53 AM By: Evlyn Kanner MD, FACS Entered By: Evlyn Kanner on 05/26/2016 10:01:53 [name] ([id number]) -------------------------------------------------------------------------------- Progress Note Details Patient Name: [name] Date of Service: 05/26/2016 9:45 AM Medical Record Number: [id number] Patient Account Number: [id number] Date of Birth/Sex: [birth year] (76 y.o. Female) Treating RN: Phillis Haggis Primary Care Provider:  Schuyler Amor Other Clinician: Referring Provider: Schuyler Amor Treating Provider/Extender: Rudene Re in Treatment: 1 Subjective Chief Complaint Information obtained from Patient Patients presents for treatment of an open diabetic ulcer to the left medial ankle which she's had for about 3 weeks History of Present Illness (HPI) The following HPI elements were documented for the patient's wound: Location: left medial ankle and foot Quality: Patient reports experiencing a dull pain to affected area(s). Severity: Patient states wound are getting worse. Duration: Patient has had the wound for < 4 weeks prior to presenting for treatment Timing: Pain in wound is constant (hurts all the time) Context: The wound appeared gradually over time Modifying Factors: Other treatment(s) tried include:went to the ER recently where she was put on Augmentin Associated Signs and Symptoms: Patient reports having increase swelling. 76 year old patient known to have diabetes mellitus has a left foot ulcer for about 3 weeks which has been causing her pain with redness and drainage. She was  recently seen at the urgent care facility where the patient had been placed on Augmentin and I do not find any x-ray was done. She was also given an intramuscular Ancef injection and asked to follow-up with the wound care. Culture showed normal skin flora. She is a former smoker She has recently moved from [place] and has not been taking treatment for her diabetes mellitus and though she wears compression stockings she has never been worked up for arterial or venous disease in the past Objective Constitutional Pulse regular. Respirations normal and unlabored. Afebrile. [name], [name] ([id number]) Vitals Time Taken: 9:35 AM, Height: 71 in, Weight: 212.2 lbs, BMI: 29.6, Temperature: 97.8 F, Pulse: 56 bpm, Respiratory Rate: 18 breaths/min, Blood Pressure: 149/75 mmHg. Eyes Nonicteric. Reactive to  light. Ears, Nose, Mouth, and Throat Lips, teeth, and gums WNL.Marland Kitchen Moist mucosa without lesions. Neck supple and nontender. No palpable supraclavicular or cervical adenopathy. Normal sized without goiter. Respiratory WNL. No retractions.. Breath sounds WNL, No rubs, rales, rhonchi, or wheeze.. Cardiovascular Heart rhythm and rate regular, no murmur or gallop.. Pedal Pulses WNL. No clubbing, cyanosis or edema. Chest Breasts symmetical and no nipple discharge.. Breast tissue WNL, no masses, lumps, or tenderness.. Lymphatic No adneopathy. No adenopathy. No adenopathy. Musculoskeletal Adexa without tenderness or enlargement.. Digits and nails w/o clubbing, cyanosis, infection, petechiae, ischemia, or inflammatory conditions.Marland Kitchen Psychiatric Judgement and insight Intact.. No evidence of depression, anxiety, or agitation.. General Notes: the lymphedema looks better and the ulceration is little cleaner. No sharp debridement was required today. Integumentary (Hair, Skin) No suspicious lesions. No crepitus or fluctuance. No peri-wound warmth or erythema. No masses.. Wound #1 status is Open. Original cause of wound was Gradually Appeared. The wound is located on the Left,Medial Foot. The wound measures 3cm length x 2cm width x 0.1cm depth; 4.712cm^2 area and 0.471cm^3 volume. There is no tunneling or undermining noted. There is a large amount of serosanguineous drainage noted. The wound margin is distinct with the outline attached to the wound base. There is large (67-100%) red granulation within the wound bed. There is a small (1-33%) amount of necrotic tissue within the wound bed including Eschar and Adherent Slough. Periwound temperature was noted as No Abnormality. The periwound has tenderness on palpation. 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 May Shower, gently pat wound dry prior to applying new dressing. Anesthetic: Wound #1 Left,Medial Foot: Topical Lidocaine 4% cream applied to wound bed prior to debridement Primary Wound Dressing: Wound #1 Left,Medial Foot: Aquacel Ag Secondary Dressing: Wound #1 Left,Medial Foot: ABD pad Dry Gauze Conform/Kerlix Other - tape, stretch netting Dressing Change Frequency: Wound #1 Left,Medial Foot: Change dressing every day. Follow-up Appointments: Wound #1 Left,Medial Foot: Return Appointment in 1 week. Edema Control: Wound #1 Left,Medial Foot: Elevate legs to the level of the heart and pump ankles as often as possible Other: - pt to wear own compression hose Additional Orders / Instructions: Wound #1 Left,Medial Foot: Increase protein intake. Medications-please add to medication list.: Wound #1 Left,Medial Foot: Other: - vitamin c, zinc, MVI [name], [name] ([id number]) I have recommended: 1. Silver alginate and a light Kerlix dressing and she can wear her compression stockings over this. 2. Elevation and exercise have been  discussed with her in great detail. 3. arterial duplex study of both lower extremities -- appointments pending 4. Venous reflux studies of both lower extremities -- appointments pending 5. X-ray of the left foot -- results reviewed with them He and her husband have read all questions answered and will be compliant with seeing her PCP for control of her medical condition and seeing me regularly at the wound center Electronic Signature(s) Signed: 05/26/2016 10:04:05 AM By: Evlyn Kanner MD, FACS Entered By: Evlyn Kanner on 05/26/2016 10:04:05 [name]  ([id number]) -------------------------------------------------------------------------------- SuperBill Details Patient Name: [name] Date of Service: 05/26/2016 Medical Record Number: [id number] Patient Account Number: [id number] Date of Birth/Sex: 1940/06/05 (76 y.o. Female) Treating RN: Ashok Cordia, Debi Primary Care Provider: Schuyler Amor Other Clinician: Referring Provider: Schuyler Amor Treating Provider/Extender: Rudene Re in Treatment: 1 Diagnosis Coding ICD-10 Codes Code Description [phone] Type 2 diabetes mellitus with foot ulcer L97.322 Non-pressure chronic ulcer of left ankle with fat layer exposed I73.9 Peripheral vascular disease, unspecified I87.312 Chronic venous hypertension (idiopathic) with ulcer of left lower extremity Facility Procedures CPT4 Code: [id number] Description: 99213 - WOUND CARE VISIT-LEV 3 EST PT Modifier: Quantity: 1 Physician Procedures CPT4 Code Description: [id number] 99213 - WC PHYS LEVEL 3 - EST PT ICD-10 Description Diagnosis E11.621 Type 2 diabetes mellitus with foot ulcer L97.322 Non-pressure chronic ulcer of left ankle with fat l I73.9 Peripheral vascular disease, unspecified  I87.312 Chronic venous hypertension (idiopathic) with ulcer Modifier: ayer exposed of left lower Quantity: 1 extremity Electronic Signature(s) Signed: 05/26/2016 4:16:12 PM By: Evlyn Kanner MD, FACS Signed: 05/26/2016 4:17:25 PM By: Alejandro Mulling Previous Signature: 05/26/2016 10:04:22 AM Version By: Evlyn Kanner MD, FACS Entered By: Alejandro Mulling on 05/26/2016 12:36:15

## 2016-06-02 ENCOUNTER — Encounter: Payer: Medicare Other | Admitting: Surgery

## 2016-06-02 DIAGNOSIS — L97521 Non-pressure chronic ulcer of other part of left foot limited to breakdown of skin: Secondary | ICD-10-CM | POA: Diagnosis not present

## 2016-06-02 DIAGNOSIS — E11621 Type 2 diabetes mellitus with foot ulcer: Secondary | ICD-10-CM | POA: Diagnosis not present

## 2016-06-02 DIAGNOSIS — Z7984 Long term (current) use of oral hypoglycemic drugs: Secondary | ICD-10-CM | POA: Diagnosis not present

## 2016-06-02 DIAGNOSIS — L97322 Non-pressure chronic ulcer of left ankle with fat layer exposed: Secondary | ICD-10-CM | POA: Diagnosis not present

## 2016-06-02 DIAGNOSIS — I87312 Chronic venous hypertension (idiopathic) with ulcer of left lower extremity: Secondary | ICD-10-CM | POA: Diagnosis not present

## 2016-06-02 DIAGNOSIS — Z87891 Personal history of nicotine dependence: Secondary | ICD-10-CM | POA: Diagnosis not present

## 2016-06-02 DIAGNOSIS — I739 Peripheral vascular disease, unspecified: Secondary | ICD-10-CM | POA: Diagnosis not present

## 2016-06-03 ENCOUNTER — Other Ambulatory Visit: Payer: Self-pay | Admitting: Surgery

## 2016-06-03 DIAGNOSIS — L97509 Non-pressure chronic ulcer of other part of unspecified foot with unspecified severity: Principal | ICD-10-CM

## 2016-06-03 DIAGNOSIS — M869 Osteomyelitis, unspecified: Principal | ICD-10-CM

## 2016-06-03 DIAGNOSIS — E1169 Type 2 diabetes mellitus with other specified complication: Principal | ICD-10-CM

## 2016-06-03 DIAGNOSIS — E11621 Type 2 diabetes mellitus with foot ulcer: Secondary | ICD-10-CM

## 2016-06-03 NOTE — Progress Notes (Signed)
[name], [name] ([id number]) Visit Report for 06/02/2016 Arrival Information Details Patient Name: [name] Date of Service: 06/02/2016 3:30 PM Medical Record Number: [id number] Patient Account Number: [id number] Date of Birth/Sex: [birth year] (76 y.o. Female) Treating RN: Afful, RN, BSN, [place] Sink Primary Care Zaydan Papesh: Schuyler Amor Other Clinician: Referring Fatoumata Albaugh: Schuyler Amor Treating Shem Plemmons/Extender: Rudene Re in Treatment: 2 Visit Information History Since Last Visit All ordered tests and consults were completed: No Patient Arrived: Ambulatory Added or deleted any medications: No Arrival Time: 14:21 Any new allergies or adverse reactions: No Accompanied By: hubby Had a fall or experienced change in No Transfer Assistance: None activities of daily living that may affect Patient Identification Verified: Yes risk of falls: Secondary Verification Process Yes Signs or symptoms of abuse/neglect since last No Completed: visito Patient Requires Transmission- No Hospitalized since last visit: No Based Precautions: Has Dressing in Place as Prescribed: Yes Patient Has Alerts: Yes Pain Present Now: No Patient Alerts: DM II L ABI noncompressible Electronic Signature(s) Signed: 06/02/2016 5:08:13 PM By: Elpidio Eric BSN, RN Entered By: Elpidio Eric on 06/02/2016 14:22:10 [name] ([id number]) -------------------------------------------------------------------------------- Encounter Discharge Information Details Patient Name: [name] Date of Service: 06/02/2016 3:30 PM Medical Record Number: [id number] Patient Account Number: [id number] Date of Birth/Sex: 01/29/1941 (76 y.o. Female) Treating RN: Clover Mealy, RN, BSN, [place] Sink Primary Care Madlyn Crosby: Schuyler Amor Other Clinician: Referring Libi Corso: Schuyler Amor Treating Derrick Tiegs/Extender: Rudene Re in Treatment: 2 Encounter Discharge Information Items Discharge Pain Level:  0 Discharge Condition: Stable Ambulatory Status: Ambulatory Discharge Destination: Home Transportation: Private Auto Accompanied By: Randa Ngo Schedule Follow-up Appointment: No Medication Reconciliation completed and provided to Patient/Care No Belissa Kooy: Provided on Clinical Summary of Care: 06/02/2016 Form Type Recipient Paper Patient TM Electronic Signature(s) Signed: 06/02/2016 2:46:09 PM By: Gwenlyn Perking Entered By: Gwenlyn Perking on 06/02/2016 14:46:09 [name] ([id number]) -------------------------------------------------------------------------------- Lower Extremity Assessment Details Patient Name: [name] Date of Service: 06/02/2016 3:30 PM Medical Record Number: [id number] Patient Account Number: [id number] Date of Birth/Sex: 01-06-1941 (76 y.o. Female) Treating RN: Afful, RN, BSN, [place] Sink Primary Care Somtochukwu Woollard: Schuyler Amor Other Clinician: Referring Jeremie Abdelaziz: Schuyler Amor Treating Harly Pipkins/Extender: Rudene Re in Treatment: 2 Edema Assessment Assessed: [Left: No] [Right: No] Edema: [Left: N] [Right: o] Vascular Assessment Pulses: Dorsalis Pedis Palpable: [Left:No] Doppler Audible: [Left:Yes] Posterior Tibial Extremity colors, hair growth, and conditions: Extremity Color: [Left:Hyperpigmented] Hair Growth on Extremity: [Left:No] Temperature of Extremity: [Left:Warm] Capillary Refill: [Left:< 3 seconds] Toe Nail Assessment Left: Right: Thick: Yes Discolored: Yes Deformed: Yes Improper Length and Hygiene: No Electronic Signature(s) Signed: 06/02/2016 5:08:13 PM By: Elpidio Eric BSN, RN Entered By: Elpidio Eric on 06/02/2016 14:22:53 [name] ([id number]) -------------------------------------------------------------------------------- Multi Wound Chart Details Patient Name: [name] Date of Service: 06/02/2016 3:30 PM Medical Record Number: [id number] Patient Account Number: [id number] Date of Birth/Sex:  08-11-1940 (76 y.o. Female) Treating RN: Clover Mealy, RN, BSN, [place] Sink Primary Care Lizzet Hendley: Schuyler Amor Other Clinician: Referring Khamila Bassinger: Schuyler Amor Treating Tremond Shimabukuro/Extender: Rudene Re in Treatment: 2 Vital Signs Height(in): 71 Pulse(bpm): 66 Weight(lbs): 212.2 Blood Pressure 134/66 (mmHg): Body Mass Index(BMI): 30 Temperature(F): 98.6 Respiratory Rate 18 (breaths/min): Photos: [1:No Photos] [N/A:N/A] Wound Location: [1:Left, Medial Foot] [N/A:N/A] Wounding Event: [1:Gradually Appeared] [N/A:N/A] Primary Etiology: [1:Diabetic Wound/Ulcer of the Lower Extremity] [N/A:N/A] Date Acquired: [1:04/28/2016] [N/A:N/A] Weeks of Treatment: [1:2] [N/A:N/A] Wound Status: [1:Open] [N/A:N/A] Measurements L x W x D 0.1x0.1x0.1 [N/A:N/A] (cm) Area (cm) : [1:0.008] [N/A:N/A] Volume (cm) : [1:0.001] [N/A:N/A] % Reduction in Area: [1:99.90%] [N/A:N/A] % Reduction in Volume: 99.90% [N/A:N/A] Classification: [1:Grade  1] [N/A:N/A] Debridement: [1:Open Wound/Selective [phone]) - Selective] [N/A:N/A] Pre-procedure [1:14:32] [N/A:N/A] Verification/Time Out Taken: Pain Control: [1:Lidocaine 4% Topical Solution] [N/A:N/A] Tissue Debrided: [1:Necrotic/Eschar, Callus] [N/A:N/A] Debridement Area (sq [1:0.01] [N/A:N/A] cm): Instrument: [1:Curette] [N/A:N/A] Bleeding: [1:None] [N/A:N/A] Procedural Pain: [1:0] [N/A:N/A] Post Procedural Pain: 0 [N/A:N/A N/A] Debridement Treatment Procedure was tolerated Response: well Post Debridement 0.1x0.1x0.1 N/A N/A Measurements L x W x D (cm) Post Debridement 0.001 N/A N/A Volume: (cm) Periwound Skin Texture: No Abnormalities Noted N/A N/A Periwound Skin No Abnormalities Noted N/A N/A Moisture: Periwound Skin Color: No Abnormalities Noted N/A N/A Tenderness on No N/A N/A Palpation: Procedures Performed: Debridement N/A N/A Treatment Notes Wound #1 (Left, Medial Foot) 1. Cleansed with: Clean wound with Normal Saline 2.  Anesthetic Topical Lidocaine 4% cream to wound bed prior to debridement 4. Dressing Applied: Aquacel Ag 5. Secondary Dressing Applied ABD Pad Kerlix/Conform 7. Secured with Tape Notes netting Electronic Signature(s) Signed: 06/02/2016 2:53:37 PM By: Evlyn Kanner MD, FACS Entered By: Evlyn Kanner on 06/02/2016 14:53:36 [name] ([id number]) -------------------------------------------------------------------------------- Multi-Disciplinary Care Plan Details Patient Name: [name] Date of Service: 06/02/2016 3:30 PM Medical Record Number: [id number] Patient Account Number: [id number] Date of Birth/Sex: May 18, 1940 (75 y.o. Female) Treating RN: Afful, RN, BSN, [place] Sink Primary Care Emry Barbato: Schuyler Amor Other Clinician: Referring Kalee Broxton: Schuyler Amor Treating Marky Buresh/Extender: Rudene Re in Treatment: 2 Active Inactive ` Orientation to the Wound Care Program Nursing Diagnoses: Knowledge deficit related to the wound healing center program Goals: Patient/caregiver will verbalize understanding of the Wound Healing Center Program Date Initiated: 05/19/2016 Target Resolution Date: 06/11/2016 Goal Status: Active Interventions: Provide education on orientation to the wound center Notes: ` Pain, Acute or Chronic Nursing Diagnoses: Pain, acute or chronic: actual or potential Potential alteration in comfort, pain Goals: Patient/caregiver will verbalize adequate pain control between visits Date Initiated: 05/19/2016 Target Resolution Date: 08/13/2016 Goal Status: Active Interventions: Assess comfort goal upon admission Complete pain assessment as per visit requirements Notes: ` Wound/Skin Impairment Nursing Diagnoses: SORREL, CASSETTA ([id number]) Impaired tissue integrity Knowledge deficit related to ulceration/compromised skin integrity Goals: Ulcer/skin breakdown will have a volume reduction of 80% by week 12 Date Initiated:  05/19/2016 Target Resolution Date: 09/03/2016 Goal Status: Active Interventions: Assess patient/caregiver ability to perform ulcer/skin care regimen upon admission and as needed Assess ulceration(s) every visit Notes: Electronic Signature(s) Signed: 06/02/2016 5:08:13 PM By: Elpidio Eric BSN, RN Entered By: Elpidio Eric on 06/02/2016 14:33:08 [name] ([id number]) -------------------------------------------------------------------------------- Pain Assessment Details Patient Name: [name] Date of Service: 06/02/2016 3:30 PM Medical Record Number: [id number] Patient Account Number: [id number] Date of Birth/Sex: 18-May-1940 (76 y.o. Female) Treating RN: Clover Mealy, RN, BSN, [place] Sink Primary Care Tabbitha Janvrin: Schuyler Amor Other Clinician: Referring Kimothy Kishimoto: Schuyler Amor Treating Rykar Lebleu/Extender: Rudene Re in Treatment: 2 Active Problems Location of Pain Severity and Description of Pain Patient Has Paino No Site Locations With Dressing Change: No Pain Management and Medication Current Pain Management: Electronic Signature(s) Signed: 06/02/2016 5:08:13 PM By: Elpidio Eric BSN, RN Entered By: Elpidio Eric on 06/02/2016 14:22:18 [name] ([id number]) -------------------------------------------------------------------------------- Patient/Caregiver Education Details Patient Name: [name] Date of Service: 06/02/2016 3:30 PM Medical Record Number: [id number] Patient Account Number: [id number] Date of Birth/Gender: 04-16-1940 (76 y.o. Female) Treating RN: Clover Mealy, RN, BSN, [place] Sink Primary Care Physician: Schuyler Amor Other Clinician: Referring Physician: Schuyler Amor Treating Physician/Extender: Rudene Re in Treatment: 2 Education Assessment Education Provided To: Patient Education Topics Provided Welcome To The Wound Care Center: Methods: Explain/Verbal Wound Debridement: Methods: Explain/Verbal Responses: State content  correctly Wound/Skin  Impairment: Methods: Explain/Verbal Responses: State content correctly Electronic Signature(s) Signed: 06/02/2016 5:08:13 PM By: Elpidio Eric BSN, RN Entered By: Elpidio Eric on 06/02/2016 14:46:27 [name] ([id number]) -------------------------------------------------------------------------------- Wound Assessment Details Patient Name: [name] Date of Service: 06/02/2016 3:30 PM Medical Record Number: [id number] Patient Account Number: [id number] Date of Birth/Sex: [birth year] (76 y.o. Female) Treating RN: Afful, RN, BSN, [place] Primary Care Aleiyah Halpin: Schuyler Amor Other Clinician: Referring Keslee Harrington: Schuyler Amor Treating Haillee Johann/Extender: Rudene Re in Treatment: 2 Wound Status Wound Number: 1 Primary Diabetic Wound/Ulcer of the Lower Etiology: Extremity Wound Location: Left, Medial Foot Wound Status: Open Wounding Event: Gradually Appeared Date Acquired: 04/28/2016 Weeks Of Treatment: 2 Clustered Wound: No Photos Photo Uploaded By: Elpidio Eric on 06/02/2016 15:41:44 Wound Measurements Length: (cm) 0.1 Width: (cm) 0.1 Depth: (cm) 0.1 Area: (cm) 0.008 Volume: (cm) 0.001 % Reduction in Area: 99.9% % Reduction in Volume: 99.9% Wound Description Classification: Grade 1 Periwound Skin Texture Texture Color No Abnormalities Noted: No No Abnormalities Noted: No Moisture No Abnormalities Noted: No Treatment Notes Wound #1 (Left, Medial Foot) 1. Cleansed withNYLANI, MICHETTI ([id number]) Clean wound with Normal Saline 2. Anesthetic Topical Lidocaine 4% cream to wound bed prior to debridement 4. Dressing Applied: Aquacel Ag 5. Secondary Dressing Applied ABD Pad Kerlix/Conform 7. Secured with Tape Notes netting Electronic Signature(s) Signed: 06/02/2016 5:08:13 PM By: Elpidio Eric BSN, RN Entered By: Elpidio Eric on 06/02/2016 14:27:10 [name]  ([id number]) -------------------------------------------------------------------------------- Vitals Details Patient Name: [name] Date of Service: 06/02/2016 3:30 PM Medical Record Number: [id number] Patient Account Number: [id number] Date of Birth/Sex: 1941/01/01 (76 y.o. Female) Treating RN: Afful, RN, BSN, [place] Primary Care Sadik Piascik: Schuyler Amor Other Clinician: Referring Kaulin Chaves: Schuyler Amor Treating Tuere Nwosu/Extender: Rudene Re in Treatment: 2 Vital Signs Time Taken: 14:22 Temperature (F): 98.6 Height (in): 71 Pulse (bpm): 66 Weight (lbs): 212.2 Respiratory Rate (breaths/min): 18 Body Mass Index (BMI): 29.6 Blood Pressure (mmHg): 134/66 Reference Range: 80 - 120 mg / dl Electronic Signature(s) Signed: 06/02/2016 5:08:13 PM By: Elpidio Eric BSN, RN Entered By: Elpidio Eric on 06/02/2016 14:25:52

## 2016-06-03 NOTE — Progress Notes (Signed)
BEV, DRENNEN ([id number]) Visit Report for 06/02/2016 Chief Complaint Document Details Patient Name: [name], [name] Date of Service: 06/02/2016 3:30 PM Medical Record Number: [id number] Patient Account Number: [id number] Date of Birth/Sex: 21-Apr-1940 (76 y.o. Female) Treating RN: Clover Mealy, RN, BSN, [place] [employer] Primary Care Provider: Schuyler Amor Other Clinician: Referring Provider: Schuyler Amor Treating Provider/Extender: Rudene Re in Treatment: 2 Information Obtained from: Patient Chief Complaint Patients presents for treatment of an open diabetic ulcer to the left medial ankle which she's had for about 3 weeks Electronic Signature(s) Signed: 06/02/2016 2:54:56 PM By: Evlyn Kanner MD, FACS Entered By: Evlyn Kanner on 06/02/2016 14:54:56 [name] ([id number]) -------------------------------------------------------------------------------- Debridement Details Patient Name: [name] Date of Service: 06/02/2016 3:30 PM Medical Record Number: [id number] Patient Account Number: [id number] Date of Birth/Sex: 1940-07-31 (76 y.o. Female) Treating RN: Afful, RN, BSN, Rita Primary Care Provider: Schuyler Amor Other Clinician: Referring Provider: Schuyler Amor Treating Provider/Extender: Rudene Re in Treatment: 2 Debridement Performed for Wound #1 Left,Medial Foot Assessment: Performed By: Physician Evlyn Kanner, MD Debridement: Open Wound/Selective Debridement Selective Description: Pre-procedure Yes - 14:32 Verification/Time Out Taken: Start Time: 14:32 Pain Control: Lidocaine 4% Topical Solution Total Area Debrided (L x 0.1 (cm) x 0.1 (cm) = 0.01 (cm) W): Tissue and other Non-Viable, Callus, Eschar material debrided: Instrument: Curette Bleeding: None End Time: 14:36 Procedural Pain: 0 Post Procedural Pain: 0 Response to Treatment: Procedure was tolerated well Post Debridement Measurements of Total Wound Length: (cm)  0.1 Width: (cm) 0.1 Depth: (cm) 0.1 Volume: (cm) 0.001 Character of Wound/Ulcer Post Stable Debridement: Severity of Tissue Post Debridement: Limited to breakdown of skin Post Procedure Diagnosis Same as Pre-procedure Electronic Signature(s) Signed: 06/02/2016 2:54:50 PM By: Evlyn Kanner MD, FACS Signed: 06/02/2016 5:08:13 PM By: Elpidio Eric BSN, RN Entered By: Evlyn Kanner on 06/02/2016 14:54:49 [name], [name] ([id number]) [name], [name] ([id number]) -------------------------------------------------------------------------------- HPI Details Patient Name: [name] Date of Service: 06/02/2016 3:30 PM Medical Record Number: [id number] Patient Account Number: [id number] Date of Birth/Sex: [birth year] (76 y.o. Female) Treating RN: Clover Mealy, RN, BSN, [place] [employer]ink Primary Care Provider: Schuyler Amor Other Clinician: Referring Provider: Schuyler Amor Treating Provider/Extender: Rudene Re in Treatment: 2 History of Present Illness Location: left medial ankle and foot Quality: Patient reports experiencing a dull pain to affected area(s). Severity: Patient states wound are getting worse. Duration: Patient has had the wound for < 4 weeks prior to presenting for treatment Timing: Pain in wound is constant (hurts all the time) Context: The wound appeared gradually over time Modifying Factors: Other treatment(s) tried include:went to the ER recently where she was put on Augmentin Associated Signs and Symptoms: Patient reports having increase swelling. HPI Description: 76 year old patient known to have diabetes mellitus has a left foot ulcer for about 3 weeks which has been causing her pain with redness and drainage. She was recently seen at the urgent care facility where the patient had been placed on Augmentin and I do not find any x-ray was done. She was also given an intramuscular Ancef injection and asked to follow-up with the wound care. Culture showed normal skin  flora. She is a former smoker She has recently moved from [place] and has not been taking treatment for her diabetes mellitus and though she wears compression stockings she has never been worked up for arterial or venous disease in the past 02/26/2016 -- Xray of the left foot -- IMPRESSION:There are no radiographic signs of osteomyelitis. Soft tissue swelling over the forefoot may reflect cellulitis. 06/02/2016 -- arterial and venous duplex studies  have not yet been done and the appointments are for in the middle of June. I will at least try and get the arterial study done earlier so that we can use appropriate compression once we have her ABI Electronic Signature(s) Signed: 06/02/2016 2:57:27 PM By: Evlyn Kanner MD, FACS Previous Signature: 06/02/2016 2:55:39 PM Version By: Evlyn Kanner MD, FACS Entered By: Evlyn Kanner on 06/02/2016 14:57:27 [name] ([id number]) -------------------------------------------------------------------------------- Physical Exam Details Patient Name: [name] Date of Service: 06/02/2016 3:30 PM Medical Record Number: [id number] Patient Account Number: [id number] Date of Birth/Sex: 11-13-1940 (76 y.o. Female) Treating RN: Clover Mealy, RN, BSN, [place] Sink Primary Care Provider: Schuyler Amor Other Clinician: Referring Provider: Schuyler Amor Treating Provider/Extender: Rudene Re in Treatment: 2 Constitutional . Pulse regular. Respirations normal and unlabored. Afebrile. . Eyes Nonicteric. Reactive to light. Ears, Nose, Mouth, and Throat Lips, teeth, and gums WNL.Marland Kitchen Moist mucosa without lesions. Neck supple and nontender. No palpable supraclavicular or cervical adenopathy. Normal sized without goiter. Respiratory WNL. No retractions.. Cardiovascular Pedal Pulses WNL. No clubbing, cyanosis or edema. Chest Breasts symmetical and no nipple discharge.. Breast tissue WNL, no masses, lumps, or tenderness.. Gastrointestinal  (GI) Abdomen without masses or tenderness.. No liver or spleen enlargement or tenderness.. Genitourinary (GU) No hydrocele, spermatocele, tenderness of the cord, or testicular mass.Marland Kitchen Penis without lesions.[name] without lesions. No cystocele, or rectocele. Pelvic support intact, no discharge.Marland Kitchen Urethra without masses, tenderness or scarring.Marland Kitchen Lymphatic No adneopathy. No adenopathy. No adenopathy. Musculoskeletal Adexa without tenderness or enlargement.. Digits and nails w/o clubbing, cyanosis, infection, petechiae, ischemia, or inflammatory conditions.. Integumentary (Hair, Skin) No suspicious lesions. No crepitus or fluctuance. No peri-wound warmth or erythema. No masses.Marland Kitchen Psychiatric Judgement and insight Intact.. No evidence of depression, anxiety, or agitation.. Notes the lymphedema has gone down significantly and I have carefully looked for any open ulcerations under the eschar and there are minute microperforations and a #3 curet was used to remove the eschar and surrounding debris [name], [name] ([id number]) Electronic Signature(s) Signed: 06/02/2016 3:13:56 PM By: Evlyn Kanner MD, FACS Entered By: Evlyn Kanner on 06/02/2016 15:13:55 [name] ([id number]) -------------------------------------------------------------------------------- Physician Orders Details Patient Name: [name] Date of Service: 06/02/2016 3:30 PM Medical Record Number: [id number] Patient Account Number: [id number] Date of Birth/Sex: Mar 13, 1940 (76 y.o. Female) Treating RN: Clover Mealy, RN, BSN, [place] Sink Primary Care Provider: Schuyler Amor Other Clinician: Referring Provider: Schuyler Amor Treating Provider/Extender: Rudene Re in Treatment: 2 Verbal / Phone Orders: No Diagnosis Coding Wound Cleansing Wound #1 Left,Medial Foot o Clean wound with Normal Saline. o May Shower, gently pat wound dry prior to applying new dressing. Anesthetic Wound #1 Left,Medial  Foot o Topical Lidocaine 4% cream applied to wound bed prior to debridement Primary Wound Dressing Wound #1 Left,Medial Foot o Aquacel Ag Secondary Dressing Wound #1 Left,Medial Foot o ABD pad o Dry Gauze o Conform/Kerlix o Other - tape, stretch netting Dressing Change Frequency Wound #1 Left,Medial Foot o Change dressing every day. Follow-up Appointments Wound #1 Left,Medial Foot o Return Appointment in 1 week. Edema Control Wound #1 Left,Medial Foot o Elevate legs to the level of the heart and pump ankles as often as possible o Other: - pt to wear own compression hose Additional Orders / Instructions Wound #1 Left,Medial Foot [name], [name] ([id number]) o Increase protein intake. Medications-please add to medication list. Wound #1 Left,Medial Foot o Other: - vitamin c, zinc, MVI Electronic Signature(s) Signed: 06/02/2016 4:50:03 PM By: Evlyn Kanner MD, FACS Signed: 06/02/2016 5:08:13 PM By: Elpidio Eric BSN, RN Entered By:  Elpidio Eric on 06/02/2016 14:38:58 [name], [name] ([id number]) -------------------------------------------------------------------------------- Problem List Details Patient Name: [name], [name] Date of Service: 06/02/2016 3:30 PM Medical Record Number: [id number] Patient Account Number: [id number] Date of Birth/Sex: Nov 08, 1940 (76 y.o. 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 05/19/2016 Yes exposed I73.9 Peripheral vascular disease, unspecified 05/19/2016 Yes I87.312 Chronic venous hypertension (idiopathic) with ulcer of left 05/19/2016 Yes lower extremity Inactive  Problems Resolved Problems Electronic Signature(s) Signed: 06/02/2016 2:53:32 PM By: Evlyn Kanner MD, FACS Entered By: Evlyn Kanner on 06/02/2016 14:53:32 [name] ([id number]) -------------------------------------------------------------------------------- Progress Note Details Patient Name: [name] Date of Service: 06/02/2016 3:30 PM Medical Record Number: [id number] Patient Account Number: [id number] Date of Birth/Sex: 12/01/1940 (76 y.o. Female) Treating RN: Afful, RN, BSN, [place] Sink Primary Care Provider: Schuyler Amor Other Clinician: Referring Provider: Schuyler Amor Treating Provider/Extender: Rudene Re in Treatment: 2 Subjective Chief Complaint Information obtained from Patient Patients presents for treatment of an open diabetic ulcer to the left medial ankle which she's had for about 3 weeks History of Present Illness (HPI) The following HPI elements were documented for the patient's wound: Location: left medial ankle and foot Quality: Patient reports experiencing a dull pain to affected area(s). Severity: Patient states wound are getting worse. Duration: Patient has had the wound for < 4 weeks prior to presenting for treatment Timing: Pain in wound is constant (hurts all the time) Context: The wound appeared gradually over time Modifying Factors: Other treatment(s) tried include:went to the ER recently where she was put on Augmentin Associated Signs and Symptoms: Patient reports having increase swelling. 76 year old patient known to have diabetes mellitus has a left foot ulcer for about 3 weeks which has been causing her pain with redness and drainage. She was recently seen at the urgent care facility where the patient had been placed on Augmentin and I do not find any x-ray was done. She was also given an intramuscular Ancef injection and asked to follow-up with the wound care. Culture showed normal skin flora. She is a former smoker She  has recently moved from [place] and has not been taking treatment for her diabetes mellitus and though she wears compression stockings she has never been worked up for arterial or venous disease in the past 02/26/2016 -- Xray of the left foot -- IMPRESSION:There are no radiographic signs of osteomyelitis. Soft tissue swelling over the forefoot may reflect cellulitis. 06/02/2016 -- arterial and venous duplex studies have not yet been done and the appointments are for in the middle of June. I will at least try and get the arterial study done earlier so that we can use appropriate compression once we have her ABI [name], [name] ([id number]) Objective Constitutional Pulse regular. Respirations normal and unlabored. Afebrile. Vitals Time Taken: 2:22 PM, Height: 71 in, Weight: 212.2 lbs, BMI: 29.6, Temperature: 98.6 F, Pulse: 66 bpm, Respiratory Rate: 18 breaths/min, Blood Pressure: 134/66 mmHg. Eyes Nonicteric. Reactive to light. Ears, Nose, Mouth, and Throat Lips, teeth, and gums WNL.Marland Kitchen Moist mucosa without lesions. Neck supple and nontender. No palpable supraclavicular or cervical adenopathy. Normal sized without goiter. Respiratory WNL. No retractions.. Cardiovascular Pedal Pulses WNL. No clubbing, cyanosis or edema. Chest Breasts symmetical and no nipple discharge.. Breast tissue WNL, no masses, lumps,  or tenderness.. Gastrointestinal (GI) Abdomen without masses or tenderness.. No liver or spleen enlargement or tenderness.. Genitourinary (GU) No hydrocele, spermatocele, tenderness of the cord, or testicular mass.Marland Kitchen Penis without lesions.[name] without lesions. No cystocele, or rectocele. Pelvic support intact, no discharge.Marland Kitchen Urethra without masses, tenderness or scarring.Marland Kitchen Lymphatic No adneopathy. No adenopathy. No adenopathy. Musculoskeletal Adexa without tenderness or enlargement.. Digits and nails w/o clubbing, cyanosis, infection, petechiae, ischemia, or  inflammatory conditions.Marland Kitchen Psychiatric Judgement and insight Intact.. No evidence of depression, anxiety, or agitation.. General Notes: the lymphedema has gone down significantly and I have carefully looked for any open ulcerations under the eschar and there are minute microperforations and a #3 curet was used to remove the eschar and surrounding debris [name], [name] ([id number]) Integumentary (Hair, Skin) No suspicious lesions. No crepitus or fluctuance. No peri-wound warmth or erythema. No masses.. Wound #1 status is Open. Original cause of wound was Gradually Appeared. The wound is located on the Left,Medial Foot. The wound measures 0.1cm length x 0.1cm width x 0.1cm depth; 0.008cm^2 area and 0.001cm^3 volume. Assessment Active Problems ICD-10 E11.621 - Type 2 diabetes mellitus with foot ulcer L97.322 - Non-pressure chronic ulcer of left ankle with fat layer exposed I73.9 - Peripheral vascular disease, unspecified I87.312 - Chronic venous hypertension (idiopathic) with ulcer of left lower extremity Procedures Wound #1 Wound #1 is a Diabetic Wound/Ulcer of the Lower Extremity located on the Left,Medial Foot . There was an Open Wound debridement with total area of 0.01 sq cm performed by Evlyn Kanner, MD. with the following instrument(s): Curette to remove Non-Viable tissue/material including Eschar and Callus after achieving pain control using Lidocaine 4% Topical Solution. A time out was conducted at 14:32, prior to the start of the procedure. There was no bleeding. The procedure was tolerated well with a pain level of 0 throughout and a pain level of 0 following the procedure. Post Debridement Measurements: 0.1cm length x 0.1cm width x 0.1cm depth; 0.001cm^3 volume. Character of Wound/Ulcer Post Debridement is stable. Severity of Tissue Post Debridement is: Limited to breakdown of skin. Post procedure Diagnosis Wound #1: Same as Pre-Procedure Plan Wound Cleansing: Wound #1  Left,Medial Foot: [place], [name] ([id number]) Clean wound with Normal Saline. May Shower, gently pat wound dry prior to applying new dressing. Anesthetic: Wound #1 Left,Medial Foot: Topical Lidocaine 4% cream applied to wound bed prior to debridement Primary Wound Dressing: Wound #1 Left,Medial Foot: Aquacel Ag Secondary Dressing: Wound #1 Left,Medial Foot: ABD pad Dry Gauze Conform/Kerlix Other - tape, stretch netting Dressing Change Frequency: Wound #1 Left,Medial Foot: Change dressing every day. Follow-up Appointments: Wound #1 Left,Medial Foot: Return Appointment in 1 week. Edema Control: Wound #1 Left,Medial Foot: Elevate legs to the level of the heart and pump ankles as often as possible Other: - pt to wear own compression hose Additional Orders / Instructions: Wound #1 Left,Medial Foot: Increase protein intake. Medications-please add to medication list.: Wound #1 Left,Medial Foot: Other: - vitamin c, zinc, MVI I have recommended: 1. Silver alginate and a light Kerlix dressing and she can wear her compression stockings over this. 2. Elevation and exercise have been discussed with her in great detail. 3. arterial duplex study of both lower extremities -- appointments pending -- Will try and get an earlier date 4. Venous reflux studies of both lower extremities -- appointments pending middle of June 5. X-ray of the left foot -- results reviewed with them He and her husband have read all questions answered and will be compliant with seeing her PCP for  control of her medical condition and seeing me regularly at the wound center [name], [name] ([id number]) Electronic Signature(s) Signed: 06/02/2016 3:14:15 PM By: Evlyn Kanner MD, FACS Previous Signature: 06/02/2016 3:01:57 PM Version By: Evlyn Kanner MD, FACS Previous Signature: 06/02/2016 2:56:48 PM Version By: Evlyn Kanner MD, FACS Entered By: Evlyn Kanner on 06/02/2016 15:14:15 [name]  ([id number]) -------------------------------------------------------------------------------- SuperBill Details Patient Name: [name] Date of Service: 06/02/2016 Medical Record Number: [id number] Patient Account Number: [id number] Date of Birth/Sex: [birth year] (76 y.o. Female) Treating RN: Afful, RN, BSN, Rita Primary Care Provider: Schuyler Amor Other Clinician: Referring Provider: Schuyler Amor Treating Provider/Extender: Rudene Re in Treatment: 2 Diagnosis Coding ICD-10 Codes Code Description [phone] Type 2 diabetes mellitus with foot ulcer L97.322 Non-pressure chronic ulcer of left ankle with fat layer exposed I73.9 Peripheral vascular disease, unspecified I87.312 Chronic venous hypertension (idiopathic) with ulcer of left lower extremity Facility Procedures CPT4 Code Description: [id number] - DEBRIDE WOUND 1ST 20 SQ CM OR < ICD-10 Description Diagnosis E11.621 Type 2 diabetes mellitus with foot ulcer L97.322 Non-pressure chronic ulcer of left ankle with fat lay I73.9 Peripheral vascular disease,  unspecified I87.312 Chronic venous hypertension (idiopathic) with ulcer o Modifier: er exposed f left lower Quantity: 1 extremity Physician Procedures CPT4 Code Description: [id number] - WC PHYS DEBR WO ANESTH 20 SQ CM ICD-10 Description Diagnosis E11.621 Type 2 diabetes mellitus with foot ulcer L97.322 Non-pressure chronic ulcer of left ankle with fat lay I73.9 Peripheral vascular disease,  unspecified I87.312 Chronic venous hypertension (idiopathic) with ulcer o Modifier: er exposed f left lower Quantity: 1 extremity Electronic Signature(s) Signed: 06/02/2016 3:14:27 PM By: Evlyn Kanner MD, FACS Entered By: Evlyn Kanner on 06/02/2016 15:14:27

## 2016-06-08 ENCOUNTER — Ambulatory Visit: Payer: Medicare Other

## 2016-06-08 DIAGNOSIS — E11621 Type 2 diabetes mellitus with foot ulcer: Secondary | ICD-10-CM

## 2016-06-08 DIAGNOSIS — L97509 Non-pressure chronic ulcer of other part of unspecified foot with unspecified severity: Secondary | ICD-10-CM | POA: Diagnosis not present

## 2016-06-08 DIAGNOSIS — M869 Osteomyelitis, unspecified: Principal | ICD-10-CM

## 2016-06-08 DIAGNOSIS — E1169 Type 2 diabetes mellitus with other specified complication: Principal | ICD-10-CM

## 2016-06-09 ENCOUNTER — Ambulatory Visit: Payer: Medicare Other | Admitting: Surgery

## 2016-06-16 ENCOUNTER — Encounter: Payer: Medicare Other | Attending: Surgery | Admitting: Surgery

## 2016-06-16 DIAGNOSIS — E119 Type 2 diabetes mellitus without complications: Secondary | ICD-10-CM | POA: Diagnosis not present

## 2016-06-16 DIAGNOSIS — L97322 Non-pressure chronic ulcer of left ankle with fat layer exposed: Secondary | ICD-10-CM | POA: Insufficient documentation

## 2016-06-16 DIAGNOSIS — I87312 Chronic venous hypertension (idiopathic) with ulcer of left lower extremity: Secondary | ICD-10-CM | POA: Diagnosis not present

## 2016-06-16 DIAGNOSIS — E785 Hyperlipidemia, unspecified: Secondary | ICD-10-CM | POA: Diagnosis not present

## 2016-06-16 DIAGNOSIS — Z7984 Long term (current) use of oral hypoglycemic drugs: Secondary | ICD-10-CM | POA: Insufficient documentation

## 2016-06-16 DIAGNOSIS — E11621 Type 2 diabetes mellitus with foot ulcer: Secondary | ICD-10-CM | POA: Diagnosis not present

## 2016-06-16 DIAGNOSIS — Z87891 Personal history of nicotine dependence: Secondary | ICD-10-CM | POA: Insufficient documentation

## 2016-06-16 DIAGNOSIS — I739 Peripheral vascular disease, unspecified: Secondary | ICD-10-CM | POA: Diagnosis not present

## 2016-06-16 DIAGNOSIS — L97529 Non-pressure chronic ulcer of other part of left foot with unspecified severity: Secondary | ICD-10-CM | POA: Diagnosis not present

## 2016-06-16 DIAGNOSIS — I872 Venous insufficiency (chronic) (peripheral): Secondary | ICD-10-CM | POA: Diagnosis not present

## 2016-06-16 DIAGNOSIS — I890 Lymphedema, not elsewhere classified: Secondary | ICD-10-CM | POA: Diagnosis not present

## 2016-06-17 ENCOUNTER — Encounter (INDEPENDENT_AMBULATORY_CARE_PROVIDER_SITE_OTHER): Payer: Self-pay | Admitting: Vascular Surgery

## 2016-06-17 ENCOUNTER — Encounter (INDEPENDENT_AMBULATORY_CARE_PROVIDER_SITE_OTHER): Payer: Medicare Other

## 2016-06-17 ENCOUNTER — Other Ambulatory Visit: Payer: Self-pay | Admitting: Surgery

## 2016-06-17 ENCOUNTER — Encounter (INDEPENDENT_AMBULATORY_CARE_PROVIDER_SITE_OTHER): Payer: Self-pay

## 2016-06-17 ENCOUNTER — Other Ambulatory Visit (INDEPENDENT_AMBULATORY_CARE_PROVIDER_SITE_OTHER): Payer: Medicare Other

## 2016-06-17 ENCOUNTER — Ambulatory Visit (INDEPENDENT_AMBULATORY_CARE_PROVIDER_SITE_OTHER): Payer: Medicare Other | Admitting: Vascular Surgery

## 2016-06-17 VITALS — BP 190/101 | HR 55 | Resp 16 | Ht 71.0 in | Wt 206.0 lb

## 2016-06-17 DIAGNOSIS — M7989 Other specified soft tissue disorders: Secondary | ICD-10-CM

## 2016-06-17 DIAGNOSIS — L97322 Non-pressure chronic ulcer of left ankle with fat layer exposed: Secondary | ICD-10-CM | POA: Diagnosis not present

## 2016-06-17 DIAGNOSIS — L97401 Non-pressure chronic ulcer of unspecified heel and midfoot limited to breakdown of skin: Secondary | ICD-10-CM | POA: Diagnosis not present

## 2016-06-17 DIAGNOSIS — E785 Hyperlipidemia, unspecified: Secondary | ICD-10-CM

## 2016-06-17 DIAGNOSIS — E08621 Diabetes mellitus due to underlying condition with foot ulcer: Secondary | ICD-10-CM

## 2016-06-17 DIAGNOSIS — E1165 Type 2 diabetes mellitus with hyperglycemia: Secondary | ICD-10-CM

## 2016-06-17 DIAGNOSIS — IMO0001 Reserved for inherently not codable concepts without codable children: Secondary | ICD-10-CM

## 2016-06-17 NOTE — Progress Notes (Signed)
Patient ID: [name], female   DOB: 1940-12-04, 76 y.o.   MRN: [id number]  Chief Complaint  Patient presents with  . New Evaluation    Ultrasounds needed for reflux    HPI [name] is a 76 y.o. female.  I am asked to see the patient by Dr. Meyer Russel for evaluation of vascular status in a patient with a slow to heal left ankle ulcer.  The patient reports A spontaneous ulceration left medial ankle lower leg about 2-3 months ago. She denies trauma, injury, or inciting event that started the ulceration. There has been discoloration of both of her lower legs for months to years now. She does not have a previous knowledge of DVT or superficial thrombophlebitis diagnosed to her knowledge. She reports being seen in the wound care center and treated with compression wraps which has significantly improved the wound. It has only a small scab left. She has multiple atherosclerotic risk factors. She had noninvasive arterial studies performed any cardiologist office earlier this month which demonstrated normal arterial perfusion with triphasic waveforms and normal ABIs bilaterally consistent with known arterial insufficiency. We performed a venous duplex today this demonstrated no evidence of deep venous thrombosis or acute superficial thrombophlebitis identified. In the right great saphenous vein there was some chronic, occlusive thrombus in the lower thigh great saphenous vein with limited amount of reflux in this area. There was no deep venous reflux in both the popliteal and femoral veins bilaterally. No significant superficial venous reflux was present on the left lower extremity. She does not have right lower extremity ulceration.   Past Medical History:  Diagnosis Date  . Diabetes mellitus without complication (HCC)   . Peripheral vascular disease (HCC)     No past surgical history on file.  Family History No bleeding disorders or clotting disorders  Social History Social  History  Substance Use Topics  . Smoking status: Former Games developer  . Smokeless tobacco: Never Used  . Alcohol use Yes  Married, husband accompanies today  No Known Allergies  Current Outpatient Prescriptions  Medication Sig Dispense Refill  . aspirin 81 MG tablet Take 81 mg by mouth daily.    . cholecalciferol (VITAMIN D) 1000 units tablet Take 1,000 Units by mouth daily.    . cyanocobalamin 1000 MCG tablet Take 1,000 mcg by mouth daily.    Marland Kitchen lisinopril (PRINIVIL,ZESTRIL) 5 MG tablet Take 1 tablet (5 mg total) by mouth daily. 30 tablet 2  . Multiple Vitamin (MULTIVITAMIN) capsule Take 1 capsule by mouth daily.    . rosuvastatin (CRESTOR) 20 MG tablet Take 1 tablet (20 mg total) by mouth daily. 90 tablet 3  . sitaGLIPtin-metformin (JANUMET) 50-500 MG tablet Take 1 tablet by mouth 2 (two) times daily with a meal. 180 tablet 3  . vitamin C (ASCORBIC ACID) 250 MG tablet Take 250 mg by mouth daily.    Marland Kitchen zinc sulfate 220 (50 Zn) MG capsule Take 220 mg by mouth daily.     No current facility-administered medications for this visit.       REVIEW OF SYSTEMS (Negative unless checked)  Constitutional: [] Weight loss  [] Fever  [] Chills Cardiac: [] Chest pain   [] Chest pressure   [] Palpitations   [] Shortness of breath when laying flat   [] Shortness of breath at rest   [] Shortness of breath with exertion. Vascular:  [] Pain in legs with walking   [] Pain in legs at rest   [] Pain in legs when laying flat   [] Claudication   [] Pain  in feet when walking  [] Pain in feet at rest  [] Pain in feet when laying flat   [] History of DVT   [] Phlebitis   [x] Swelling in legs   [x] Varicose veins   [x] Non-healing ulcers Pulmonary:   [] Uses home oxygen   [] Productive cough   [] Hemoptysis   [] Wheeze  [] COPD   [] Asthma Neurologic:  [] Dizziness  [] Blackouts   [] Seizures   [] History of stroke   [] History of TIA  [] Aphasia   [] Temporary blindness   [] Dysphagia   [] Weakness or numbness in arms   [] Weakness or numbness in  legs Musculoskeletal:  [x] Arthritis   [] Joint swelling   [] Joint pain   [] Low back pain Hematologic:  [] Easy bruising  [] Easy bleeding   [] Hypercoagulable state   [] Anemic  [] Hepatitis Gastrointestinal:  [] Blood in stool   [] Vomiting blood  [] Gastroesophageal reflux/heartburn   [] Abdominal pain Genitourinary:  [] Chronic kidney disease   [] Difficult urination  [] Frequent urination  [] Burning with urination   [] Hematuria Skin:  [] Rashes   [x] Ulcers   [x] Wounds Psychological:  [] History of anxiety   []  History of major depression.    Physical Exam BP (!) 190/101 (BP Location: Right Arm)   Pulse (!) 55   Resp 16   Ht 5\' 11"  (1.803 m)   Wt 206 lb (93.4 kg)   BMI 28.73 kg/m  Gen:  WD/WN, NAD. Appears younger than stated age Head: [place]/AT, No temporalis wasting. Ear/Nose/Throat: Hearing grossly intact, nares w/o erythema or drainage, oropharynx w/o Erythema/Exudate Eyes: Conjunctiva clear, sclera non-icteric  Neck: trachea midline.  No JVD.  Pulmonary:  Good air movement, no use of accessory muscles Cardiac: RRR Vascular:  Vessel Right Left  Radial Palpable Palpable                          PT Trace Palpable Trace Palpable  DP 1+ Palpable 1+ Palpable   Gastrointestinal: soft, non-tender/non-distended. Musculoskeletal: M/S 5/5 throughout. Small scab is present on the left medial ankle and lower leg area from the previous ulceration. No deformity or atrophy. 1-2+ bilateral lower extremity edema. Moderate stasis dermatitis changes are present bilaterally Neurologic: Sensation grossly intact in extremities.  Symmetrical.  Speech is fluent. Motor exam as listed above. Psychiatric: Judgment intact, Mood & affect appropriate for pt's clinical situation. Dermatologic: Almost healed wound in the left medial ankle and lower leg area as above significant stasis dermatitis changes present in both lower extremities    8 CLINICAL DATA:  Nonhealing infection of the right foot at using a skin  file. The infected site is on lateral aspect of foot at the base of the fifth metatarsal. EXAM: LEFT FOOT - COMPLETE 3+ VIEW COMPARISON:  None in PACs FINDINGS: The bones are subjectively mildly osteopenic. There is no lytic or blastic lesion or periosteal reaction. There is no acute fracture. Specific attention to fifth metatarsal reveals no abnormality. There is soft tissue swelling over the forefoot especially dorsally. There is flattening of the plantar arch. There is a small plantar calcaneal spur. IMPRESSION: There are no radiographic signs of osteomyelitis. Soft tissue swelling over the forefoot may reflect cellulitis. Electronically Signed   By: David  SwazilandJordan M.D.   On: 05/19/2016 15:32   Labs Recent Results (from the past 2160 hour(s))  Wound or Superficial Culture     Status: None   Collection Time: 05/16/16 12:33 PM  Result Value Ref Range   Specimen Description ULCER    Special Requests Normal    Gram  Stain      FEW WBC PRESENT, PREDOMINANTLY PMN FEW GRAM POSITIVE RODS FEW GRAM POSITIVE COCCI IN PAIRS    Culture      NORMAL SKIN FLORA Performed at [facility], [address], [place], [place] [address]    Report Status 05/18/2016 FINAL   Comprehensive Metabolic Panel (CMET)     Status: Abnormal   Collection Time: 05/23/16  4:19 PM  Result Value Ref Range   Glucose 170 (H) 65 - 99 mg/dL   BUN 16 8 - 27 mg/dL   Creatinine, Ser 8.65 0.57 - 1.00 mg/dL   GFR calc non Af Amer 56 (L) >59 mL/min/1.73   GFR calc Af Amer 64 >59 mL/min/1.73   BUN/Creatinine Ratio 16 12 - 28   Sodium 140 134 - 144 mmol/L   Potassium 4.7 3.5 - 5.2 mmol/L   Chloride 101 96 - 106 mmol/L   CO2 21 18 - 29 mmol/L   Calcium 10.3 8.7 - 10.3 mg/dL   Total Protein 7.9 6.0 - 8.5 g/dL   Albumin 4.4 3.5 - 4.8 g/dL   Globulin, Total 3.5 1.5 - 4.5 g/dL   Albumin/Globulin Ratio 1.3 1.2 - 2.2   Bilirubin Total 0.3 0.0 - 1.2 mg/dL   Alkaline Phosphatase 96 39 - 117 IU/L   AST 21 0 - 40 IU/L   ALT 19 0 -  32 IU/L  CBC     Status: None   Collection Time: 05/23/16  4:19 PM  Result Value Ref Range   WBC 6.0 3.4 - 10.8 x10E3/uL   RBC 4.75 3.77 - 5.28 x10E6/uL   Hemoglobin 13.2 11.1 - 15.9 g/dL   Hematocrit 78.4 69.6 - 46.6 %   MCV 84 79 - 97 fL   MCH 27.8 26.6 - 33.0 pg   MCHC 33.1 31.5 - 35.7 g/dL   RDW 29.5 28.4 - 13.2 %   Platelets 233 150 - 379 x10E3/uL  Urine Microalbumin w/creat. ratio     Status: Abnormal   Collection Time: 05/23/16  4:19 PM  Result Value Ref Range   Creatinine, Urine 136.2 Not Estab. mg/dL   Albumin, Urine 440.1 Not Estab. ug/mL   Microalb/Creat Ratio 103.7 (H) 0.0 - 30.0 mg/g creat  Lipid Profile     Status: Abnormal   Collection Time: 05/23/16  4:19 PM  Result Value Ref Range   Cholesterol, Total 213 (H) 100 - 199 mg/dL   Triglycerides 027 (H) 0 - 149 mg/dL   HDL 46 >25 mg/dL   VLDL Cholesterol Cal 37 5 - 40 mg/dL   LDL Calculated 366 (H) 0 - 99 mg/dL   Chol/HDL Ratio 4.6 (H) 0.0 - 4.4 ratio    Comment:                                   T. Chol/HDL Ratio                                             Men  Women                               1/2 Avg.Risk  3.4    3.3  Avg.Risk  5.0    4.4                                2X Avg.Risk  9.6    7.1                                3X Avg.Risk 23.4   11.0   HgB A1c     Status: Abnormal   Collection Time: 05/23/16  4:19 PM  Result Value Ref Range   Hgb A1c MFr Bld 9.9 (H) 4.8 - 5.6 %    Comment:          Pre-diabetes: 5.7 - 6.4          Diabetes: >6.4          Glycemic control for adults with diabetes: <7.0    Est. average glucose Bld gHb Est-mCnc 237 mg/dL  Vitamin D (25 hydroxy)     Status: Abnormal   Collection Time: 05/23/16  4:19 PM  Result Value Ref Range   Vit D, 25-Hydroxy 13.1 (L) 30.0 - 100.0 ng/mL    Comment: Vitamin D deficiency has been defined by the Institute of Medicine and an Endocrine Society practice guideline as a level of serum 25-OH vitamin D less than 20  ng/mL (1,2). The Endocrine Society went on to further define vitamin D insufficiency as a level between 21 and 29 ng/mL (2). 1. IOM (Institute of Medicine). 2010. Dietary reference    intakes for calcium and D. [place]: The    [employer]. 2. Holick MF, Binkley [place], Bischoff-Ferrari HA, et al.    Evaluation, treatment, and prevention of vitamin D    deficiency: an Endocrine Society clinical practice    guideline. JCEM. 2011 Jul; 96(7):1911-30.   B12     Status: None   Collection Time: 05/23/16  4:19 PM  Result Value Ref Range   Vitamin B-12 659 232 - 1,245 pg/mL  TSH     Status: None   Collection Time: 05/23/16  4:19 PM  Result Value Ref Range   TSH 1.420 0.450 - 4.500 uIU/mL    Assessment/Plan:  Diabetes mellitus type 2, uncontrolled, without complications (HCC) blood glucose control important in reducing the progression of atherosclerotic disease. Also, involved in wound healing. On appropriate medications.   Hyperlipidemia lipid control important in reducing the progression of atherosclerotic disease. Continue statin therapy   Swelling of limb Noninvasive studies as described above. Would benefit from daily use of compression stockings, leg elevation, and increasing her activity. No intervention would be of benefit based on the findings from her study. Return to clinic as needed.      Festus Barren 06/17/2016, 4:46 PM   This note was created with Dragon medical transcription system.  Any errors from dictation are unintentional.

## 2016-06-17 NOTE — Assessment & Plan Note (Signed)
blood glucose control important in reducing the progression of atherosclerotic disease. Also, involved in wound healing. On appropriate medications.  

## 2016-06-17 NOTE — Assessment & Plan Note (Signed)
lipid control important in reducing the progression of atherosclerotic disease. Continue statin therapy  

## 2016-06-17 NOTE — Assessment & Plan Note (Signed)
Noninvasive studies as described above. Would benefit from daily use of compression stockings, leg elevation, and increasing her activity. No intervention would be of benefit based on the findings from her study. Return to clinic as needed.

## 2016-06-18 NOTE — Progress Notes (Addendum)
EMMELYN, SCHMALE ([id number]) Visit Report for 06/16/2016 Chief Complaint Document Details Patient Name: [name], [name] Date of Service: 06/16/2016 1:30 PM Medical Record Number: [id number] Patient Account Number: [id number] Date of Birth/Sex: Feb 26, 1940 (76 y.o. Female) Treating RN: Clover Mealy, RN, BSN, [place] Sink Primary Care Provider: Schuyler Amor Other Clinician: Referring Provider: Schuyler Amor Treating Provider/Extender: Rudene Re in Treatment: 4 Information Obtained from: Patient Chief Complaint Patients presents for treatment of an open diabetic ulcer to the left medial ankle which she's had for about 3 weeks Electronic Signature(s) Signed: 06/16/2016 2:30:40 PM By: Evlyn Kanner MD, FACS Entered By: Evlyn Kanner on 06/16/2016 14:30:40 [name] ([id number]) -------------------------------------------------------------------------------- HPI Details Patient Name: [name] Date of Service: 06/16/2016 1:30 PM Medical Record Number: [id number] Patient Account Number: [id number] Date of Birth/Sex: 04-Sep-1940 (76 y.o. Female) Treating RN: Ashok Cordia, Debi Primary Care Provider: Schuyler Amor Other Clinician: Referring Provider: Schuyler Amor Treating Provider/Extender: Rudene Re in Treatment: 4 History of Present Illness Location: left medial ankle and foot Quality: Patient reports experiencing a dull pain to affected area(s). Severity: Patient states wound are getting worse. Duration: Patient has had the wound for < 4 weeks prior to presenting for treatment Timing: Pain in wound is constant (hurts all the time) Context: The wound appeared gradually over time Modifying Factors: Other treatment(s) tried include:went to the ER recently where she was put on Augmentin Associated Signs and Symptoms: Patient reports having increase swelling. HPI Description: 76 year old patient known to have diabetes mellitus has a left foot ulcer for about 3  weeks which has been causing her pain with redness and drainage. She was recently seen at the urgent care facility where the patient had been placed on Augmentin and I do not find any x-ray was done. She was also given an intramuscular Ancef injection and asked to follow-up with the wound care. Culture showed normal skin flora. She is a former smoker She has recently moved from [place] and has not been taking treatment for her diabetes mellitus and though she wears compression stockings she has never been worked up for arterial or venous disease in the past 02/26/2016 -- Xray of the left foot -- IMPRESSION:There are no radiographic signs of osteomyelitis. Soft tissue swelling over the forefoot may reflect cellulitis. 06/02/2016 -- arterial and venous duplex studies have not yet been done and the appointments are for in the middle of June. I will at least try and get the arterial study done earlier so that we can use appropriate compression once we have her ABI 06/16/2016 -- -- had a lower arterial examination done and her right ABI was 1.0 and the left ABI is 1.2 with the right TBI being 1.1 and the left ABI being 0.78. She had normal PPG waveforms on all her toes. the venous reflux/duplex studies are not scheduled to the third week of June and we have tried to get an earlier date Psychologist, prison and probation services) Signed: 06/16/2016 2:31:13 PM By: Evlyn Kanner MD, FACS Previous Signature: 06/16/2016 12:47:01 PM Version By: Evlyn Kanner MD, FACS Previous Signature: 06/16/2016 12:45:40 PM Version By: Evlyn Kanner MD, FACS Entered By: Evlyn Kanner on 06/16/2016 14:31:13 [name] ([id number]) -------------------------------------------------------------------------------- Physical Exam Details Patient Name: [name] Date of Service: 06/16/2016 1:30 PM Medical Record Number: [id number] Patient Account Number: [id number] Date of Birth/Sex: [birth year] (76 y.o. Female) Treating RN:  Afful, RN, BSN, [place] [employer] Primary Care Provider: Schuyler Amor Other Clinician: Referring Provider: Schuyler Amor Treating Provider/Extender: Rudene Re in Treatment: 4 Constitutional . Pulse regular. Respirations normal and  unlabored. Afebrile. . Eyes Nonicteric. Reactive to light. Ears, Nose, Mouth, and Throat Lips, teeth, and gums WNL.Marland Kitchen Moist mucosa without lesions. Neck supple and nontender. No palpable supraclavicular or cervical adenopathy. Normal sized without goiter. Respiratory WNL. No retractions.. Cardiovascular Pedal Pulses WNL. No clubbing, cyanosis or edema. Lymphatic No adneopathy. No adenopathy. No adenopathy. Musculoskeletal Adexa without tenderness or enlargement.. Digits and nails w/o clubbing, cyanosis, infection, petechiae, ischemia, or inflammatory conditions.. Integumentary (Hair, Skin) No suspicious lesions. No crepitus or fluctuance. No peri-wound warmth or erythema. No masses.Marland Kitchen Psychiatric Judgement and insight Intact.. No evidence of depression, anxiety, or agitation.. Notes the patient's wound has healed and there has been no drainage from the area at all. The lymphedema is also well controlled. Electronic Signature(s) Signed: 06/16/2016 2:31:44 PM By: Evlyn Kanner MD, FACS Entered By: Evlyn Kanner on 06/16/2016 14:31:43 [name] ([id number]) -------------------------------------------------------------------------------- Physician Orders Details Patient Name: [name] Date of Service: 06/16/2016 1:30 PM Medical Record Number: [id number] Patient Account Number: [id number] Date of Birth/Sex: April 14, 1940 (76 y.o. Female) Treating RN: Clover Mealy, RN, BSN, [place] [employer] Primary Care Provider: Schuyler Amor Other Clinician: Referring Provider: Schuyler Amor Treating Provider/Extender: Rudene Re in Treatment: 4 Verbal / Phone Orders: No Diagnosis Coding Edema Control o Patient to wear own compression stockings o Elevate  legs to the level of the heart and pump ankles as often as possible Discharge From [facility] Services o Discharge from Wound Care Center - Treatment Completed Electronic Signature(s) Signed: 06/16/2016 3:51:42 PM By: Elpidio Eric BSN, RN Signed: 06/16/2016 4:13:28 PM By: Evlyn Kanner MD, FACS Entered By: Elpidio Eric on 06/16/2016 13:05:31 [name] ([id number]) -------------------------------------------------------------------------------- Problem List Details Patient Name: [name] Date of Service: 06/16/2016 1:30 PM Medical Record Number: [id number] Patient Account Number: [id number] Date of Birth/Sex: [birth year] (76 y.o. 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/19/2016 Yes exposed I73.9 Peripheral vascular disease, unspecified 05/19/2016 Yes I87.312 Chronic venous hypertension (idiopathic) with ulcer of left 05/19/2016 Yes lower extremity Inactive Problems Resolved Problems Electronic Signature(s) Signed: 06/16/2016 2:30:27 PM By: Evlyn Kanner MD, FACS Entered By: Evlyn Kanner on 06/16/2016 14:30:26 [name] ([id number]) -------------------------------------------------------------------------------- Progress Note Details Patient Name: [name] Date of Service: 06/16/2016 1:30 PM Medical Record Number: [id number] Patient Account Number: [id number] Date of Birth/Sex: 1940/09/22 (76 y.o. Female) Treating RN: Afful, RN, BSN, [place] [employer]ink Primary Care Provider: Schuyler Amor Other Clinician: Referring Provider: Schuyler Amor Treating Provider/Extender: Rudene Re in Treatment:  4 Subjective Chief Complaint Information obtained from Patient Patients presents for treatment of an open diabetic ulcer to the left medial ankle which she's had for about 3 weeks History of Present Illness (HPI) The following HPI elements were documented for the patient's wound: Location: left medial ankle and foot Quality: Patient reports experiencing a dull pain to affected area(s). Severity: Patient states wound are getting worse. Duration: Patient has had the wound for < 4 weeks prior to presenting for treatment Timing: Pain in wound is constant (hurts all the time) Context: The wound appeared gradually over time Modifying Factors: Other treatment(s) tried include:went to the ER recently where she was put on Augmentin Associated Signs and Symptoms: Patient reports having increase swelling. 76 year old patient known to have diabetes mellitus has a left foot ulcer for about  3 weeks which has been causing her pain with redness and drainage. She was recently seen at the urgent care facility where the patient had been placed on Augmentin and I do not find any x-ray was done. She was also given an intramuscular Ancef injection and asked to follow-up with the wound care. Culture showed normal skin flora. She is a former smoker She has recently moved from [place] and has not been taking treatment for her diabetes mellitus and though she wears compression stockings she has never been worked up for arterial or venous disease in the past 02/26/2016 -- Xray of the left foot -- IMPRESSION:There are no radiographic signs of osteomyelitis. Soft tissue swelling over the forefoot may reflect cellulitis. 06/02/2016 -- arterial and venous duplex studies have not yet been done and the appointments are for in the middle of June. I will at least try and get the arterial study done earlier so that we can use appropriate compression once we have her ABI 06/16/2016 -- -- had a lower arterial  examination done and her right ABI was 1.0 and the left ABI is 1.2 with the right TBI being 1.1 and the left ABI being 0.78. She had normal PPG waveforms on all her toes. the venous reflux/duplex studies are not scheduled to the third week of June and we have tried to get an earlier date [place], [place] ([id number]) Objective Constitutional Pulse regular. Respirations normal and unlabored. Afebrile. Vitals Time Taken: 12:50 PM, Height: 71 in, Weight: 212.2 lbs, BMI: 29.6, Temperature: 98.1 F, Pulse: 65 bpm, Respiratory Rate: 16 breaths/min, Blood Pressure: 148/84 mmHg. Eyes Nonicteric. Reactive to light. Ears, Nose, Mouth, and Throat Lips, teeth, and gums WNL.Marland Kitchen Moist mucosa without lesions. Neck supple and nontender. No palpable supraclavicular or cervical adenopathy. Normal sized without goiter. Respiratory WNL. No retractions.. Cardiovascular Pedal Pulses WNL. No clubbing, cyanosis or edema. Lymphatic No adneopathy. No adenopathy. No adenopathy. Musculoskeletal Adexa without tenderness or enlargement.. Digits and nails w/o clubbing, cyanosis, infection, petechiae, ischemia, or inflammatory conditions.Marland Kitchen Psychiatric Judgement and insight Intact.. No evidence of depression, anxiety, or agitation.. General Notes: the patient's wound has healed and there has been no drainage from the area at all. The lymphedema is also well controlled. Integumentary (Hair, Skin) No suspicious lesions. No crepitus or fluctuance. No peri-wound warmth or erythema. No masses.. Wound #1 status is Healed - Epithelialized. Original cause of wound was Gradually Appeared. The wound is located on the Left,Medial Foot. The wound measures 0cm length x 0cm width x 0cm depth; 0cm^2 area and 0cm^3 volume. The wound is limited to skin breakdown. There is no tunneling or undermining noted. [name], [name] ([id number]) There is a none present amount of drainage noted. The wound margin is flat and intact. There  is no granulation within the wound bed. There is no necrotic tissue within the wound bed. The periwound skin appearance exhibited: Dry/Scaly. The periwound skin appearance did not exhibit: Callus, Crepitus, Excoriation, Induration, Rash, Scarring, Maceration, Atrophie Blanche, Cyanosis, Ecchymosis, Hemosiderin Staining, Mottled, Pallor, Rubor, Erythema. Periwound temperature was noted as No Abnormality. 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 She does have the hyperpigmentation of venous stasis disease. However her venous reflux studies are not yet  available and in the meanwhile I have strongly advised her to continue wearing her compression stockings, elevate and exercise. She is discharged on the wound care service with explicit instructions to follow-up on her venous duplex and reflux studies and then to follow-up with the vascular surgeon for possible endovenous ablation. The patient and her husband are aware of the fact that they can call as if they experience any difficulties Plan Edema Control: Patient to wear own compression stockings Elevate legs to the level of the heart and pump ankles as often as possible [facility] - Treatment Completed The wound on the left medial ankle has completely healed and her lymphedema is well controlled. She does have the hyperpigmentation of venous stasis disease. However her venous reflux studies are not yet [name], [name] ([id number]) available and in the meanwhile I have strongly advised her to continue wearing her compression stockings, elevate  and exercise. She is discharged on the wound care service with explicit instructions to follow-up on her venous duplex and reflux studies and then to follow-up with the vascular surgeon for possible endovenous ablation. The patient and her husband are aware of the fact that they can call as if they experience any difficulties Electronic Signature(s) Signed: 06/16/2016 2:33:13 PM By: Evlyn KannerBritto, Jencarlos Nicolson MD, FACS Entered By: Evlyn KannerBritto, Jeannifer Drakeford on 06/16/2016 14:33:13 [name], [name] ([id number]) -------------------------------------------------------------------------------- SuperBill Details Patient Name: [name], [name] Date of Service: 06/16/2016 Medical Record Number: [id number] Patient Account Number: [id number] Date of Birth/Sex: 09/12/1940 (76 y.o. Female) Treating RN: Afful, RN, BSN, [place] Primary Care Provider: Schuyler AmorPLONK, WILLIAM Other Clinician: Referring Provider: Schuyler AmorPLONK, WILLIAM Treating Provider/Extender: Rudene ReBritto, Raegan Winders Weeks in Treatment: 4 Diagnosis Coding ICD-10 Codes Code Description [id number] Type 2 diabetes mellitus with foot ulcer L97.322 Non-pressure chronic ulcer of left ankle with fat layer exposed I73.9 Peripheral vascular disease, unspecified I87.312 Chronic venous hypertension (idiopathic) with ulcer of left lower extremity Facility Procedures CPT4 Code: [id number] Description: [id number] - WOUND CARE VISIT-LEV 2 EST PT Modifier: Quantity: 1 Physician Procedures CPT4 Code Description: [id number] - WC PHYS LEVEL 3 - EST PT ICD-10 Description Diagnosis E11.621 Type 2 diabetes mellitus with foot ulcer L97.322 Non-pressure chronic ulcer of left ankle with fat l I73.9 Peripheral vascular disease, unspecified  I87.312 Chronic venous hypertension (idiopathic) with ulcer Modifier: ayer exposed of left lower Quantity: 1 extremity Electronic Signature(s) Signed: 06/16/2016 2:33:37 PM By: Evlyn KannerBritto, Joelle Flessner MD, FACS Entered By: Evlyn KannerBritto, Marine Lezotte on 06/16/2016 14:33:36

## 2016-06-18 NOTE — Progress Notes (Signed)
[name], [name] ([id number]) Visit Report for 06/16/2016 Arrival Information Details Patient Name: [name], [name] Date of Service: 06/16/2016 1:30 PM Medical Record Number: [id number] Patient Account Number: [id number] Date of Birth/Sex: Jun 20, 1940 (76 y.o. Female) Treating RN: Afful, RN, BSN, [place] Sink Primary Care Antonieta Slaven: Schuyler Amor Other Clinician: Referring Libni Fusaro: Schuyler Amor Treating Kinlee Garrison/Extender: Rudene Re in Treatment: 4 Visit Information History Since Last Visit All ordered tests and consults were completed: No Patient Arrived: Ambulatory Added or deleted any medications: No Arrival Time: 12:49 Any new allergies or adverse reactions: No Accompanied By: hubby Had a fall or experienced change in No Transfer Assistance: None activities of daily living that may affect Patient Identification Verified: Yes risk of falls: Secondary Verification Process Yes Signs or symptoms of abuse/neglect since last No Completed: visito Patient Requires Transmission- No Hospitalized since last visit: No Based Precautions: Has Dressing in Place as Prescribed: Yes Patient Has Alerts: Yes Pain Present Now: No Patient Alerts: DM II L ABI noncompressible Electronic Signature(s) Signed: 06/16/2016 3:51:42 PM By: Elpidio Eric BSN, RN Entered By: Elpidio Eric on 06/16/2016 12:49:56 [name] ([id number]) -------------------------------------------------------------------------------- Clinic Level of Care Assessment Details Patient Name: [name] Date of Service: 06/16/2016 1:30 PM Medical Record Number: [id number] Patient Account Number: [id number] Date of Birth/Sex: [birth year] (76 y.o. Female) Treating RN: Afful, RN, BSN, [place] Primary Care Vayden Weinand: Schuyler Amor Other Clinician: Referring Aeneas Longsworth: Schuyler Amor Treating Janaa Acero/Extender: Rudene Re in Treatment: 4 Clinic Level of Care Assessment Items TOOL 4 Quantity Score []  -  Use when only an EandM is performed on FOLLOW-UP visit 0 ASSESSMENTS - Nursing Assessment / Reassessment X - Reassessment of Co-morbidities (includes updates in patient status) 1 10 X - Reassessment of Adherence to Treatment Plan 1 5 ASSESSMENTS - Wound and Skin Assessment / Reassessment X - Simple Wound Assessment / Reassessment - one wound 1 5 []  - Complex Wound Assessment / Reassessment - multiple wounds 0 []  - Dermatologic / Skin Assessment (not related to wound area) 0 ASSESSMENTS - Focused Assessment []  - Circumferential Edema Measurements - multi extremities 0 []  - Nutritional Assessment / Counseling / Intervention 0 X - Lower Extremity Assessment (monofilament, tuning fork, pulses) 1 5 []  - Peripheral Arterial Disease Assessment (using hand held doppler) 0 ASSESSMENTS - Ostomy and/or Continence Assessment and Care []  - Incontinence Assessment and Management 0 []  - Ostomy Care Assessment and Management (repouching, etc.) 0 PROCESS - Coordination of Care X - Simple Patient / Family Education for ongoing care 1 15 []  - Complex (extensive) Patient / Family Education for ongoing care 0 []  - Staff obtains Chiropractor, Records, Test Results / Process Orders 0 []  - Staff telephones HHA, Nursing Homes / Clarify orders / etc 0 []  - Routine Transfer to another Facility (non-emergent condition) 0 [place], [place] ([id number]) []  - Routine Hospital Admission (non-emergent condition) 0 []  - New Admissions / Manufacturing engineer / Ordering NPWT, Apligraf, etc. 0 []  - Emergency Hospital Admission (emergent condition) 0 []  - Simple Discharge Coordination 0 []  - Complex (extensive) Discharge Coordination 0 PROCESS - Special Needs []  - Pediatric / Minor Patient Management 0 []  - Isolation Patient Management 0 []  - Hearing / Language / Visual special needs 0 []  - Assessment of Community assistance (transportation, D/C planning, etc.) 0 []  - Additional assistance / Altered mentation 0 []  -  Support Surface(s) Assessment (bed, cushion, seat, etc.) 0 INTERVENTIONS - Wound Cleansing / Measurement X - Simple Wound Cleansing - one wound 1 5 []  - Complex Wound Cleansing - multiple  wounds 0 X - Wound Imaging (photographs - any number of wounds) 1 5 []  - Wound Tracing (instead of photographs) 0 []  - Simple Wound Measurement - one wound 0 []  - Complex Wound Measurement - multiple wounds 0 INTERVENTIONS - Wound Dressings []  - Small Wound Dressing one or multiple wounds 0 []  - Medium Wound Dressing one or multiple wounds 0 []  - Large Wound Dressing one or multiple wounds 0 []  - Application of Medications - topical 0 []  - Application of Medications - injection 0 INTERVENTIONS - Miscellaneous []  - External ear exam 0 Shaheed, Yarely ([id number]) []  - Specimen Collection (cultures, biopsies, blood, body fluids, etc.) 0 []  - Specimen(s) / Culture(s) sent or taken to Lab for analysis 0 []  - Patient Transfer (multiple staff / Michiel Sites Lift / Similar devices) 0 []  - Simple Staple / Suture removal (25 or less) 0 []  - Complex Staple / Suture removal (26 or more) 0 []  - Hypo / Hyperglycemic Management (close monitor of Blood Glucose) 0 []  - Ankle / Brachial Index (ABI) - do not check if billed separately 0 X - Vital Signs 1 5 Has the patient been seen at the hospital within the last three years: Yes Total Score: 55 Level Of Care: New/Established - Level 2 Electronic Signature(s) Signed: 06/16/2016 3:51:42 PM By: Elpidio Eric BSN, RN Entered By: Elpidio Eric on 06/16/2016 13:06:04 [name] ([id number]) -------------------------------------------------------------------------------- Encounter Discharge Information Details Patient Name: [name] Date of Service: 06/16/2016 1:30 PM Medical Record Number: [id number] Patient Account Number: [id number] Date of Birth/Sex: 1940-07-28 (76 y.o. Female) Treating RN: Clover Mealy, RN, BSN, [place] [facility] Primary Care Joan Avetisyan: Schuyler Amor Other  Clinician: Referring Anvita Hirata: Schuyler Amor Treating Cissy Galbreath/Extender: Rudene Re in Treatment: 4 Encounter Discharge Information Items Discharge Pain Level: 0 Discharge Condition: Stable Ambulatory Status: Ambulatory Discharge Destination: Home Transportation: Private Auto Accompanied By: Randa Ngo Schedule Follow-up Appointment: No Medication Reconciliation completed No and provided to Patient/Care Rajanae Mantia: Patient Clinical Summary of Care: Declined Electronic Signature(s) Signed: 06/16/2016 1:14:21 PM By: Gwenlyn Perking Entered By: Gwenlyn Perking on 06/16/2016 13:14:21 [name] ([id number]) -------------------------------------------------------------------------------- Lower Extremity Assessment Details Patient Name: [name] Date of Service: 06/16/2016 1:30 PM Medical Record Number: [id number] Patient Account Number: [id number] Date of Birth/Sex: 1940/03/15 (76 y.o. Female) Treating RN: Afful, RN, BSN, [place] Sink Primary Care Michelangelo Rindfleisch: Schuyler Amor Other Clinician: Referring Kamarius Buckbee: Schuyler Amor Treating Kinley Dozier/Extender: Rudene Re in Treatment: 4 Vascular Assessment Claudication: Claudication Assessment [Left:None] Pulses: Dorsalis Pedis Palpable: [Left:Yes] Posterior Tibial Extremity colors, hair growth, and conditions: Extremity Color: [Left:Hyperpigmented] Hair Growth on Extremity: [Left:No] Temperature of Extremity: [Left:Warm] Capillary Refill: [Left:< 3 seconds] Toe Nail Assessment Left: Right: Thick: Yes Discolored: Yes Deformed: No Improper Length and Hygiene: No Electronic Signature(s) Signed: 06/16/2016 3:51:42 PM By: Elpidio Eric BSN, RN Entered By: Elpidio Eric on 06/16/2016 12:53:08 [name] ([id number]) -------------------------------------------------------------------------------- Multi Wound Chart Details Patient Name: [name] Date of Service: 06/16/2016 1:30 PM Medical Record  Number: [id number] Patient Account Number: [id number] Date of Birth/Sex: 1940/08/31 (76 y.o. Female) Treating RN: Clover Mealy, RN, BSN, [place] Sink Primary Care Izzah Pasqua: Schuyler Amor Other Clinician: Referring Kaliq Lege: Schuyler Amor Treating Daeveon Zweber/Extender: Rudene Re in Treatment: 4 Vital Signs Height(in): 71 Pulse(bpm): 65 Weight(lbs): 212.2 Blood Pressure 148/84 (mmHg): Body Mass Index(BMI): 30 Temperature(F): 98.1 Respiratory Rate 16 (breaths/min): Photos: [N/A:N/A] Wound Location: Left Foot - Medial N/A N/A Wounding Event: Gradually Appeared N/A N/A Primary Etiology: Diabetic Wound/Ulcer of N/A N/A the Lower Extremity Comorbid History: Type II Diabetes N/A N/A Date Acquired: 04/28/2016 N/A N/A  Weeks of Treatment: 4 N/A N/A Wound Status: Healed - Epithelialized N/A N/A Measurements L x W x D 0x0x0 N/A N/A (cm) Area (cm) : 0 N/A N/A Volume (cm) : 0 N/A N/A % Reduction in Area: 100.00% N/A N/A % Reduction in Volume: 100.00% N/A N/A Classification: Grade 1 N/A N/A Exudate Amount: None Present N/A N/A Wound Margin: Flat and Intact N/A N/A Granulation Amount: None Present (0%) N/A N/A Necrotic Amount: None Present (0%) N/A N/A Exposed Structures: Fascia: No N/A N/A Fat Layer (Subcutaneous Tissue) Exposed: No Tendon: No [place], Shiryl ([id number]) Muscle: No Joint: No Bone: No Limited to Skin Breakdown Epithelialization: Large (67-100%) N/A N/A Periwound Skin Texture: Excoriation: No N/A N/A Induration: No Callus: No Crepitus: No Rash: No Scarring: No Periwound Skin Dry/Scaly: Yes N/A N/A Moisture: Maceration: No Periwound Skin Color: Atrophie Blanche: No N/A N/A Cyanosis: No Ecchymosis: No Erythema: No Hemosiderin Staining: No Mottled: No Pallor: No Rubor: No Temperature: No Abnormality N/A N/A Tenderness on No N/A N/A Palpation: Wound Preparation: Ulcer Cleansing: Other: N/A N/A surg scrub and water Topical Anesthetic Applied:  None Treatment Notes Electronic Signature(s) Signed: 06/16/2016 2:30:33 PM By: Evlyn KannerBritto, Errol MD, FACS Entered By: Evlyn KannerBritto, Errol on 06/16/2016 14:30:33 [name], [name] ([id number]) -------------------------------------------------------------------------------- Multi-Disciplinary Care Plan Details Patient Name: [name], [name] Date of Service: 06/16/2016 1:30 PM Medical Record Number: [id number] Patient Account Number: [id number] Date of Birth/Sex: 16-May-1940 (76 y.o. Female) Treating RN: Clover MealyAfful, RN, BSN, [place] Sinkita Primary Care Maysel Mccolm: Schuyler AmorPLONK, WILLIAM Other Clinician: Referring Ewin Rehberg: Schuyler AmorPLONK, WILLIAM Treating Maudry Zeidan/Extender: Rudene ReBritto, Errol Weeks in Treatment: 4 Active Inactive Electronic Signature(s) Signed: 06/16/2016 3:51:42 PM By: Elpidio EricAfful, Rita BSN, RN Entered By: Elpidio EricAfful, Rita on 06/16/2016 13:04:50 [name], [name] ([id number]) -------------------------------------------------------------------------------- Pain Assessment Details Patient Name: [name], [name] Date of Service: 06/16/2016 1:30 PM Medical Record Number: [id number] Patient Account Number: [id number] Date of Birth/Sex: 16-May-1940 (76 y.o. Female) Treating RN: Clover MealyAfful, RN, BSN, [place] Sinkita Primary Care Huberta Tompkins: Schuyler AmorPLONK, WILLIAM Other Clinician: Referring Averiana Clouatre: Schuyler AmorPLONK, WILLIAM Treating Soriya Worster/Extender: Rudene ReBritto, Errol Weeks in Treatment: 4 Active Problems Location of Pain Severity and Description of Pain Patient Has Paino No Site Locations With Dressing Change: No Pain Management and Medication Current Pain Management: Electronic Signature(s) Signed: 06/16/2016 3:51:42 PM By: Elpidio EricAfful, Rita BSN, RN Entered By: Elpidio EricAfful, Rita on 06/16/2016 12:50:08 [name], Analleli ([id number]) -------------------------------------------------------------------------------- Patient/Caregiver Education Details Patient Name: [name], [name] Date of Service: 06/16/2016 1:30 PM Medical Record Number: [id number] Patient Account  Number: [id number] Date of Birth/Gender: 16-May-1940 (76 y.o. Female) Treating RN: Clover MealyAfful, RN, BSN, [place] Sinkita Primary Care Physician: Schuyler AmorPLONK, WILLIAM Other Clinician: Referring Physician: Schuyler AmorPLONK, WILLIAM Treating Physician/Extender: Rudene ReBritto, Errol Weeks in Treatment: 4 Education Assessment Education Provided To: Patient Education Topics Provided Basic Hygiene: Methods: Explain/Verbal Responses: State content correctly Electronic Signature(s) Signed: 06/16/2016 3:51:42 PM By: Elpidio EricAfful, Rita BSN, RN Entered By: Elpidio EricAfful, Rita on 06/16/2016 13:06:47 [name], [name] ([id number]) -------------------------------------------------------------------------------- Wound Assessment Details Patient Name: [name], [name] Date of Service: 06/16/2016 1:30 PM Medical Record Number: [id number] Patient Account Number: [id number] Date of Birth/Sex: 16-May-1940 (76 y.o. Female) Treating RN: Clover MealyAfful, RN, BSN, [place] Sinkita Primary Care Nelissa Bolduc: Schuyler AmorPLONK, WILLIAM Other Clinician: Referring Kiarah Eckstein: Schuyler AmorPLONK, WILLIAM Treating Alynna Hargrove/Extender: Rudene ReBritto, Errol Weeks in Treatment: 4 Wound Status Wound Number: 1 Primary Diabetic Wound/Ulcer of the Lower Etiology: Extremity Wound Location: Left Foot - Medial Wound Status: Healed - Epithelialized Wounding Event: Gradually Appeared Comorbid Type II Diabetes Date Acquired: 04/28/2016 History: Weeks Of Treatment: 4 Clustered Wound: No Photos Photo Uploaded By: Elpidio EricAfful, Rita on 06/16/2016 14:18:00 Wound Measurements Length: (cm) 0 % Reduction i Width: (  cm) 0 % Reduction i Depth: (cm) 0 Epithelializa Area: (cm) 0 Tunneling: Volume: (cm) 0 Undermining: n Area: 100% n Volume: 100% tion: Large (67-100%) No No Wound Description Classification: Grade 1 Foul Odor Aft Wound Margin: Flat and Intact Slough/Fibrin Exudate Amount: None Present er Cleansing: No o No Wound Bed Granulation Amount: None Present (0%) Exposed Structure Necrotic Amount: None Present (0%) Fascia  Exposed: No Fat Layer (Subcutaneous Tissue) Exposed: No Tendon Exposed: No Muscle Exposed: No Joint Exposed: No [place], Fatiha ([id number]) Bone Exposed: No Limited to Skin Breakdown Periwound Skin Texture Texture Color No Abnormalities Noted: No No Abnormalities Noted: No Callus: No Atrophie Blanche: No Crepitus: No Cyanosis: No Excoriation: No Ecchymosis: No Induration: No Erythema: No Rash: No Hemosiderin Staining: No Scarring: No Mottled: No Pallor: No Moisture Rubor: No No Abnormalities Noted: No Dry / Scaly: Yes Temperature / Pain Maceration: No Temperature: No Abnormality Wound Preparation Ulcer Cleansing: Other: surg scrub and water, Topical Anesthetic Applied: None Electronic Signature(s) Signed: 06/16/2016 3:51:42 PM By: Elpidio Eric BSN, RN Entered By: Elpidio Eric on 06/16/2016 13:04:33 [name] ([id number]) -------------------------------------------------------------------------------- Vitals Details Patient Name: [name] Date of Service: 06/16/2016 1:30 PM Medical Record Number: [id number] Patient Account Number: [id number] Date of Birth/Sex: Feb 16, 1940 (76 y.o. Female) Treating RN: Afful, RN, BSN, [place] Primary Care Harriette Tovey: Schuyler Amor Other Clinician: Referring Catherene Kaleta: Schuyler Amor Treating Zyriah Mask/Extender: Rudene Re in Treatment: 4 Vital Signs Time Taken: 12:50 Temperature (F): 98.1 Height (in): 71 Pulse (bpm): 65 Weight (lbs): 212.2 Respiratory Rate (breaths/min): 16 Body Mass Index (BMI): 29.6 Blood Pressure (mmHg): 148/84 Reference Range: 80 - 120 mg / dl Electronic Signature(s) Signed: 06/16/2016 3:51:42 PM By: Elpidio Eric BSN, RN Entered By: Elpidio Eric on 06/16/2016 12:53:24

## 2016-06-20 ENCOUNTER — Ambulatory Visit (INDEPENDENT_AMBULATORY_CARE_PROVIDER_SITE_OTHER): Payer: Medicare Other | Admitting: Family Medicine

## 2016-06-20 ENCOUNTER — Encounter: Payer: Self-pay | Admitting: Family Medicine

## 2016-06-20 VITALS — BP 138/84 | HR 84 | Resp 16 | Ht 71.0 in | Wt 208.0 lb

## 2016-06-20 DIAGNOSIS — E1165 Type 2 diabetes mellitus with hyperglycemia: Secondary | ICD-10-CM

## 2016-06-20 DIAGNOSIS — E785 Hyperlipidemia, unspecified: Secondary | ICD-10-CM

## 2016-06-20 DIAGNOSIS — L97429 Non-pressure chronic ulcer of left heel and midfoot with unspecified severity: Secondary | ICD-10-CM

## 2016-06-20 DIAGNOSIS — E11621 Type 2 diabetes mellitus with foot ulcer: Secondary | ICD-10-CM | POA: Diagnosis not present

## 2016-06-20 DIAGNOSIS — E663 Overweight: Secondary | ICD-10-CM

## 2016-06-20 DIAGNOSIS — R110 Nausea: Secondary | ICD-10-CM

## 2016-06-20 DIAGNOSIS — E538 Deficiency of other specified B group vitamins: Secondary | ICD-10-CM | POA: Diagnosis not present

## 2016-06-20 DIAGNOSIS — R809 Proteinuria, unspecified: Secondary | ICD-10-CM | POA: Diagnosis not present

## 2016-06-20 DIAGNOSIS — E559 Vitamin D deficiency, unspecified: Secondary | ICD-10-CM | POA: Diagnosis not present

## 2016-06-20 DIAGNOSIS — IMO0001 Reserved for inherently not codable concepts without codable children: Secondary | ICD-10-CM

## 2016-06-20 MED ORDER — VITAMIN D 50 MCG (2000 UT) PO CAPS: 1.0000 | ORAL_CAPSULE | Freq: Every day | ORAL | Status: DC

## 2016-06-20 NOTE — Progress Notes (Signed)
Date:  06/20/2016   Name:  [name]   DOB:  1940/08/12   MRN:  [id number]  PCP:  Schuyler AmorPlonk, Karyme Mcconathy, MD    Chief Complaint: Diabetes (Blood Sugar 92-132 range. Having stomach pains and stopped ALL meds 3-4 days ago to see what caused stomach pains. She admits to drinking coffee all day long 4-8 cups average. Does not feel she needs any meds at all. )   History of Present Illness:  This is a 76 y.o. female seen in one month f/u from initial visit. Says she stopped all her rx meds 2-3 days ago due to nausea which has improved. On further questioning admits she was not taking any rx meds when seem last month. L foot ulcer healed, vascular w/u negative, wearing comp stockings and checking feet daily. Drinking caffeinated coffee all day and not really limiting sugar in diet. Placed on lisinopril last visit for elevated MCR and vit D dose increased. Memory about the same.  Review of Systems:  Review of Systems  Constitutional: Negative for chills and fever.  Respiratory: Negative for cough and shortness of breath.   Cardiovascular: Negative for chest pain and palpitations.  Gastrointestinal: Negative for abdominal pain.  Endocrine: Negative for polydipsia and polyuria.  Genitourinary: Negative for difficulty urinating.  Neurological: Negative for syncope and light-headedness.    Patient Active Problem List   Diagnosis Date Noted  . Swelling of limb 06/17/2016  . Microalbuminuria 05/24/2016  . Diabetes mellitus type 2, uncontrolled, without complications (HCC) 05/23/2016  . Cellulitis of left foot 05/23/2016  . Diabetic ulcer of left foot (HCC) 05/23/2016  . Hyperlipidemia 05/23/2016  . Vitamin D deficiency 05/23/2016  . B12 deficiency 05/23/2016  . Short-term memory loss 05/23/2016  . Overweight (BMI 25.0-29.9) 05/23/2016  . Edema of right lower extremity 05/23/2016    Prior to Admission medications   Medication Sig Start Date End Date Taking? Authorizing Provider  aspirin 81  MG tablet Take 81 mg by mouth daily.    [provider]  Cholecalciferol (VITAMIN D) 2000 units CAPS Take 1 capsule (2,000 Units total) by mouth daily. 06/20/16   Jaydon Soroka, Chrissie NoaWilliam, MD  cyanocobalamin 1000 MCG tablet Take 1,000 mcg by mouth daily.    [provider]  lisinopril (PRINIVIL,ZESTRIL) 5 MG tablet Take 1 tablet (5 mg total) by mouth daily. Patient not taking: Reported on 06/20/2016 05/24/16   Schuyler AmorPlonk, Tyree Vandruff, MD  Multiple Vitamin (MULTIVITAMIN) capsule Take 1 capsule by mouth daily.    [provider]  rosuvastatin (CRESTOR) 20 MG tablet Take 1 tablet (20 mg total) by mouth daily. Patient not taking: Reported on 06/20/2016 05/23/16   Schuyler AmorPlonk, Na Waldrip, MD  sitaGLIPtin-metformin (JANUMET) 50-500 MG tablet Take 1 tablet by mouth 2 (two) times daily with a meal. Patient not taking: Reported on 06/20/2016 05/23/16   Schuyler AmorPlonk, Starkeisha Vanwinkle, MD    No Known Allergies  History reviewed. No pertinent surgical history.  Social History  Substance Use Topics  . Smoking status: Former Games developermoker  . Smokeless tobacco: Never Used  . Alcohol use Yes    History reviewed. No pertinent family history.  Medication list has been reviewed and updated.  Physical Examination: BP 138/84   Pulse 84   Resp 16   Ht 5\' 11"  (1.803 m)   Wt 208 lb (94.3 kg)   SpO2 98%   BMI 29.01 kg/m   Physical Exam  Constitutional: She appears well-developed and well-nourished.  Cardiovascular: Normal rate, regular rhythm and normal heart sounds.   Pulmonary/Chest:  Effort normal and breath sounds normal.  Musculoskeletal:  1+ BLE edema  Neurological: She is alert.  Skin: Skin is warm and dry.  Psychiatric: She has a normal mood and affect. Her behavior is normal.  Nursing note and vitals reviewed.   Assessment and Plan:  1. Uncontrolled type 2 diabetes mellitus without complication, without long-term current use of insulin (HCC) A1c 9.9% off meds last month, restart Januvia bid, importance of DM  control discussed - Ambulatory referral to Ophthalmology  2. Microalbuminuria Restart lisinopril, recheck MCR next visit  3. Hyperlipidemia, unspecified hyperlipidemia type LDL 130 off meds, restart Crestor, recheck lipids next visit  4. Vitamin D deficiency Increase vit D to 2000 units daily  5. B12 deficiency Cont B12 supplement  6. Overweight (BMI 25.0-29.9) Weight up 10# since last visit (doubt accurate), diabetic diet given, consider diabetic education referral  7. Nausea Doubt medication related, decrease caffeine use or switch to decaf, take meds with food  8. Diabetic ulcer of left midfoot associated with type 2 diabetes mellitus, unspecified ulcer stage (HCC) Resolved, continue daily foot checks, compression stockings, d/c vit C/zinc  Return in about 4 weeks (around 07/18/2016).  Dionne Ano. Kingsley Spittle MD [facility]  06/20/2016

## 2016-06-20 NOTE — Patient Instructions (Signed)
Restart the listed medications. Decrease your coffee intake or switch to decaf.  Diabetes Mellitus and Food It is important for you to manage your blood sugar (glucose) level. Your blood glucose level can be greatly affected by what you eat. Eating healthier foods in the appropriate amounts throughout the day at about the same time each day will help you control your blood glucose level. It can also help slow or prevent worsening of your diabetes mellitus. Healthy eating may even help you improve the level of your blood pressure and reach or maintain a healthy weight. General recommendations for healthful eating and cooking habits include:  Eating meals and snacks regularly. Avoid going long periods of time without eating to lose weight.  Eating a diet that consists mainly of plant-based foods, such as fruits, vegetables, nuts, legumes, and whole grains.  Using low-heat cooking methods, such as baking, instead of high-heat cooking methods, such as deep frying. Work with your dietitian to make sure you understand how to use the Nutrition Facts information on food labels. How can food affect me? Carbohydrates  Carbohydrates affect your blood glucose level more than any other type of food. Your dietitian will help you determine how many carbohydrates to eat at each meal and teach you how to count carbohydrates. Counting carbohydrates is important to keep your blood glucose at a healthy level, especially if you are using insulin or taking certain medicines for diabetes mellitus. Alcohol  Alcohol can cause sudden decreases in blood glucose (hypoglycemia), especially if you use insulin or take certain medicines for diabetes mellitus. Hypoglycemia can be a life-threatening condition. Symptoms of hypoglycemia (sleepiness, dizziness, and disorientation) are similar to symptoms of having too much alcohol. If your health care provider has given you approval to drink alcohol, do so in moderation and use the  following guidelines:  Women should not have more than one drink per day, and men should not have more than two drinks per day. One drink is equal to:  12 oz of beer.  5 oz of wine.  1 oz of hard liquor.  Do not drink on an empty stomach.  Keep yourself hydrated. Have water, diet soda, or unsweetened iced tea.  Regular soda, juice, and other mixers might contain a lot of carbohydrates and should be counted. What foods are not recommended? As you make food choices, it is important to remember that all foods are not the same. Some foods have fewer nutrients per serving than other foods, even though they might have the same number of calories or carbohydrates. It is difficult to get your body what it needs when you eat foods with fewer nutrients. Examples of foods that you should avoid that are high in calories and carbohydrates but low in nutrients include:  Trans fats (most processed foods list trans fats on the Nutrition Facts label).  Regular soda.  Juice.  Candy.  Sweets, such as cake, pie, doughnuts, and cookies.  Fried foods. What foods can I eat? Eat nutrient-rich foods, which will nourish your body and keep you healthy. The food you should eat also will depend on several factors, including:  The calories you need.  The medicines you take.  Your weight.  Your blood glucose level.  Your blood pressure level.  Your cholesterol level. You should eat a variety of foods, including:  Protein.  Lean cuts of meat.  Proteins low in saturated fats, such as fish, egg whites, and beans. Avoid processed meats.  Fruits and vegetables.  Fruits and  vegetables that may help control blood glucose levels, such as apples, mangoes, and yams.  Dairy products.  Choose fat-free or low-fat dairy products, such as milk, yogurt, and cheese.  Grains, bread, pasta, and rice.  Choose whole grain products, such as multigrain bread, whole oats, and brown rice. These foods may help  control blood pressure.  Fats.  Foods containing healthful fats, such as nuts, avocado, olive oil, canola oil, and fish. Does everyone with diabetes mellitus have the same meal plan? Because every person with diabetes mellitus is different, there is not one meal plan that works for everyone. It is very important that you meet with a dietitian who will help you create a meal plan that is just right for you. This information is not intended to replace advice given to you by your health care provider. Make sure you discuss any questions you have with your health care provider. Document Released: 10/21/2004 Document Revised: 07/02/2015 Document Reviewed: 12/21/2012 Elsevier Interactive Patient Education  2017 [employer].

## 2016-06-21 ENCOUNTER — Encounter (INDEPENDENT_AMBULATORY_CARE_PROVIDER_SITE_OTHER): Payer: Medicare Other | Admitting: Vascular Surgery

## 2016-06-21 ENCOUNTER — Encounter (INDEPENDENT_AMBULATORY_CARE_PROVIDER_SITE_OTHER): Payer: Medicare Other

## 2016-06-24 ENCOUNTER — Encounter (INDEPENDENT_AMBULATORY_CARE_PROVIDER_SITE_OTHER): Payer: Self-pay | Admitting: Vascular Surgery

## 2016-06-28 ENCOUNTER — Ambulatory Visit: Payer: Medicare Other | Admitting: Internal Medicine

## 2016-07-06 DIAGNOSIS — E119 Type 2 diabetes mellitus without complications: Secondary | ICD-10-CM | POA: Diagnosis not present

## 2016-07-11 DIAGNOSIS — H40003 Preglaucoma, unspecified, bilateral: Secondary | ICD-10-CM | POA: Diagnosis not present

## 2016-07-13 DIAGNOSIS — H4010X0 Unspecified open-angle glaucoma, stage unspecified: Secondary | ICD-10-CM | POA: Diagnosis not present

## 2016-07-15 ENCOUNTER — Encounter (INDEPENDENT_AMBULATORY_CARE_PROVIDER_SITE_OTHER): Payer: Self-pay

## 2016-07-19 ENCOUNTER — Ambulatory Visit (INDEPENDENT_AMBULATORY_CARE_PROVIDER_SITE_OTHER): Payer: Medicare Other | Admitting: Family Medicine

## 2016-07-19 ENCOUNTER — Encounter: Payer: Self-pay | Admitting: Family Medicine

## 2016-07-19 VITALS — BP 126/94 | HR 63 | Temp 98.1°F | Resp 17 | Ht 71.0 in | Wt 204.0 lb

## 2016-07-19 DIAGNOSIS — E538 Deficiency of other specified B group vitamins: Secondary | ICD-10-CM

## 2016-07-19 DIAGNOSIS — E785 Hyperlipidemia, unspecified: Secondary | ICD-10-CM | POA: Diagnosis not present

## 2016-07-19 DIAGNOSIS — R809 Proteinuria, unspecified: Secondary | ICD-10-CM | POA: Diagnosis not present

## 2016-07-19 DIAGNOSIS — E559 Vitamin D deficiency, unspecified: Secondary | ICD-10-CM | POA: Diagnosis not present

## 2016-07-19 DIAGNOSIS — E663 Overweight: Secondary | ICD-10-CM | POA: Diagnosis not present

## 2016-07-19 DIAGNOSIS — IMO0001 Reserved for inherently not codable concepts without codable children: Secondary | ICD-10-CM

## 2016-07-19 DIAGNOSIS — E1165 Type 2 diabetes mellitus with hyperglycemia: Secondary | ICD-10-CM

## 2016-07-19 NOTE — Progress Notes (Signed)
Date:  07/19/2016   Name:  [name]   DOB:  October 21, 1940   MRN:  [id number]  PCP:  Schuyler Amor, MD    Chief Complaint: Diabetes (1 month follow up)   History of Present Illness:  This is a 76 y.o. female seen for one month f/u. Taking Janumet bid, has stopped B12 supp. Still c/o nausea after taking meds in AM, thinks may be Crestor though has tolerated in past. Did not have nausea when taking Janumet alone. Has cut back on caffeine to one cup coffee daily. Trying to decrease sugar intake but loves ice cream. LE edema about the same.  Review of Systems:  Review of Systems  Constitutional: Negative for chills and fever.  Respiratory: Negative for cough and shortness of breath.   Cardiovascular: Negative for chest pain.  Gastrointestinal: Negative for abdominal pain, constipation, diarrhea and vomiting.  Endocrine: Negative for polydipsia and polyuria.  Genitourinary: Negative for difficulty urinating.  Neurological: Negative for syncope and light-headedness.    Patient Active Problem List   Diagnosis Date Noted  . Swelling of limb 06/17/2016  . Microalbuminuria 05/24/2016  . Diabetes mellitus type 2, uncontrolled, without complications (HCC) 05/23/2016  . Cellulitis of left foot 05/23/2016  . Diabetic ulcer of left foot (HCC) 05/23/2016  . Hyperlipidemia 05/23/2016  . Vitamin D deficiency 05/23/2016  . B12 deficiency 05/23/2016  . Short-term memory loss 05/23/2016  . Overweight (BMI 25.0-29.9) 05/23/2016  . Edema of right lower extremity 05/23/2016    Prior to Admission medications   Medication Sig Start Date End Date Taking? Authorizing Provider  aspirin 81 MG tablet Take 81 mg by mouth daily.   Yes [provider]  Cholecalciferol (VITAMIN D) 2000 units CAPS Take 1 capsule (2,000 Units total) by mouth daily. 06/20/16  Yes Yaquelin Langelier, Chrissie Noa, MD  lisinopril (PRINIVIL,ZESTRIL) 5 MG tablet Take 1 tablet (5 mg total) by mouth daily. 05/24/16  Yes Akshar Starnes, Chrissie Noa, MD   Multiple Vitamin (MULTIVITAMIN) capsule Take 1 capsule by mouth daily.   Yes [provider]  rosuvastatin (CRESTOR) 20 MG tablet Take 1 tablet (20 mg total) by mouth daily. 05/23/16  Yes Diamante Rubin, Chrissie Noa, MD  sitaGLIPtin-metformin (JANUMET) 50-500 MG tablet Take 1 tablet by mouth 2 (two) times daily with a meal. 05/23/16  Yes Deivi Huckins, Chrissie Noa, MD    No Known Allergies  No past surgical history on file.  Social History  Substance Use Topics  . Smoking status: Former Games developer  . Smokeless tobacco: Never Used  . Alcohol use Yes    No family history on file.  Medication list has been reviewed and updated.  Physical Examination: BP (!) 126/94 (BP Location: Left Arm, Patient Position: Sitting, Cuff Size: Normal)   Pulse 63   Temp 98.1 F (36.7 C) (Oral)   Resp 17   Ht 5\' 11"  (1.803 m)   Wt 204 lb (92.5 kg)   SpO2 98%   BMI 28.45 kg/m   Physical Exam  Constitutional: She appears well-developed and well-nourished.  Cardiovascular: Normal rate, regular rhythm and normal heart sounds.   Pulmonary/Chest: Effort normal and breath sounds normal.  Musculoskeletal: She exhibits no edema.  Neurological:  B foot sensation intact per microfilament testing  Skin: Skin is warm and dry.  Psychiatric: She has a normal mood and affect. Her behavior is normal.  Nursing note and vitals reviewed.   Assessment and Plan:  1. Uncontrolled type 2 diabetes mellitus without complication, without long-term current use of insulin (HCC) Cont Janumet bid, rechack  a1c next visit, ok to eat ice cream once weekly or try sugar-free version - Basic Metabolic Panel (BMET)  2. Hyperlipidemia, unspecified hyperlipidemia type On Crestor but may be contributing to nausea, will hold x 1 week to see if improves - Lipid Profile  3. Vitamin D deficiency On supplement - Vitamin D (25 hydroxy)  4. B12 deficiency Off supplement - B12  5. Overweight (BMI 25.0-29.9) Weight down 4#, continue weight  loss  6. Microalbuminuria On lisinopril - Urine Microalbumin w/creat. ratio  Return in about 4 weeks (around 08/16/2016).  Dionne AnoWilliam M. Kingsley SpittlePlonk, Jr. MD [facility]  07/19/2016

## 2016-07-21 ENCOUNTER — Other Ambulatory Visit: Payer: Self-pay | Admitting: Family Medicine

## 2016-07-21 LAB — BASIC METABOLIC PANEL
BUN / CREAT RATIO: 13 (ref 12–28)
BUN: 12 mg/dL (ref 8–27)
CHLORIDE: 104 mmol/L (ref 96–106)
CO2: 19 mmol/L — ABNORMAL LOW (ref 20–29)
Calcium: 10.1 mg/dL (ref 8.7–10.3)
Creatinine, Ser: 0.96 mg/dL (ref 0.57–1.00)
GFR calc non Af Amer: 58 mL/min/{1.73_m2} — ABNORMAL LOW (ref >59)
GFR, EST AFRICAN AMERICAN: 67 mL/min/{1.73_m2} (ref >59)
GLUCOSE: 99 mg/dL (ref 65–99)
POTASSIUM: 5.9 mmol/L — AB (ref 3.5–5.2)
SODIUM: 141 mmol/L (ref 134–144)

## 2016-07-21 LAB — VITAMIN D 25 HYDROXY (VIT D DEFICIENCY, FRACTURES): VIT D 25 HYDROXY: 44.3 ng/mL (ref 30.0–100.0)

## 2016-07-21 LAB — LIPID PANEL
Chol/HDL Ratio: 2.1 ratio (ref 0.0–4.4)
Cholesterol, Total: 106 mg/dL (ref 100–199)
HDL: 51 mg/dL (ref >39)
LDL Calculated: 39 mg/dL (ref 0–99)
Triglycerides: 82 mg/dL (ref 0–149)
VLDL CHOLESTEROL CAL: 16 mg/dL (ref 5–40)

## 2016-07-21 LAB — MICROALBUMIN / CREATININE URINE RATIO
CREATININE, UR: 97.3 mg/dL
MICROALB/CREAT RATIO: 72.7 mg/g{creat} — AB (ref 0.0–30.0)
MICROALBUM., U, RANDOM: 70.7 ug/mL

## 2016-07-21 LAB — VITAMIN B12: VITAMIN B 12: 905 pg/mL (ref 232–1245)

## 2016-07-21 MED ORDER — PRAVASTATIN SODIUM 40 MG PO TABS
40.0000 mg | ORAL_TABLET | Freq: Every day | ORAL | 2 refills | Status: DC
Start: 2016-07-21 — End: 2016-12-13

## 2016-07-21 MED ORDER — LISINOPRIL-HYDROCHLOROTHIAZIDE 10-12.5 MG PO TABS: 1.0000 | ORAL_TABLET | Freq: Every day | ORAL | 2 refills | Status: DC

## 2016-07-28 ENCOUNTER — Encounter (INDEPENDENT_AMBULATORY_CARE_PROVIDER_SITE_OTHER): Payer: Self-pay

## 2016-07-28 ENCOUNTER — Encounter (INDEPENDENT_AMBULATORY_CARE_PROVIDER_SITE_OTHER): Payer: Self-pay | Admitting: Vascular Surgery

## 2016-08-18 ENCOUNTER — Ambulatory Visit (INDEPENDENT_AMBULATORY_CARE_PROVIDER_SITE_OTHER): Payer: Medicare Other | Admitting: Family Medicine

## 2016-08-18 ENCOUNTER — Encounter: Payer: Self-pay | Admitting: Family Medicine

## 2016-08-18 VITALS — BP 138/84 | HR 64 | Resp 16 | Ht 71.0 in | Wt 205.0 lb

## 2016-08-18 DIAGNOSIS — R809 Proteinuria, unspecified: Secondary | ICD-10-CM | POA: Diagnosis not present

## 2016-08-18 DIAGNOSIS — E663 Overweight: Secondary | ICD-10-CM

## 2016-08-18 DIAGNOSIS — IMO0001 Reserved for inherently not codable concepts without codable children: Secondary | ICD-10-CM

## 2016-08-18 DIAGNOSIS — E1165 Type 2 diabetes mellitus with hyperglycemia: Secondary | ICD-10-CM | POA: Diagnosis not present

## 2016-08-18 DIAGNOSIS — E785 Hyperlipidemia, unspecified: Secondary | ICD-10-CM

## 2016-08-18 DIAGNOSIS — E559 Vitamin D deficiency, unspecified: Secondary | ICD-10-CM

## 2016-08-18 DIAGNOSIS — I10 Essential (primary) hypertension: Secondary | ICD-10-CM | POA: Insufficient documentation

## 2016-08-18 NOTE — Progress Notes (Signed)
Date:  08/18/2016   Name:  [name]   DOB:  1940-07-17   MRN:  [id number]  PCP:  Schuyler AmorPlonk, Mykhia Danish, MD    Chief Complaint: Diabetes (Needs Generic Glucometer sent in. BS 100-106. Feeling sick on all the meds after she takes them. Taking all at once in mornings. ); Hyperlipidemia (Has two bottles of different cholesterol meds. Has only been taking the Provastatin. Should the other be D/C as it is already not in chart. ); and Advice Only (Does she need to take Vit C and D? She feels like she is on way to many meds. )   History of Present Illness:  This is a 76 y.o. female seen for one month f/u. Home BGs in low 100s. Taking both Crestor and Pravachol, still on zinc but off B12 and vit C. Lisinopril dose increased and HCTZ added last visit due to elevated MCR and potassium. On vit D supp, level ok last month.  Again declines all immunizations.   Review of Systems:  Review of Systems  Constitutional: Negative for chills and fever.  Respiratory: Negative for cough and shortness of breath.   Cardiovascular: Negative for chest pain and leg swelling.  Endocrine: Negative for polydipsia and polyuria.  Genitourinary: Negative for difficulty urinating.  Neurological: Negative for syncope and light-headedness.    Patient Active Problem List   Diagnosis Date Noted  . Hypertension 08/18/2016  . Microalbuminuria 05/24/2016  . Diabetes mellitus type 2, uncontrolled, without complications (HCC) 05/23/2016  . Cellulitis of left foot 05/23/2016  . Diabetic ulcer of left foot (HCC) 05/23/2016  . Hyperlipidemia 05/23/2016  . Vitamin D deficiency 05/23/2016  . B12 deficiency 05/23/2016  . Short-term memory loss 05/23/2016  . Overweight (BMI 25.0-29.9) 05/23/2016  . Edema of right lower extremity 05/23/2016    Prior to Admission medications   Medication Sig Start Date End Date Taking? Authorizing Provider  aspirin 81 MG tablet Take 81 mg by mouth daily.   Yes [provider]   Cholecalciferol (VITAMIN D) 2000 units CAPS Take 1 capsule (2,000 Units total) by mouth daily. 06/20/16  Yes Venesha Petraitis, Chrissie NoaWilliam, MD  lisinopril-hydrochlorothiazide (PRINZIDE,ZESTORETIC) 10-12.5 MG tablet Take 1 tablet by mouth daily. 07/21/16  Yes Seab Axel, Chrissie NoaWilliam, MD  Multiple Vitamin (MULTIVITAMIN) capsule Take 1 capsule by mouth daily.   Yes [provider]  pravastatin (PRAVACHOL) 40 MG tablet Take 1 tablet (40 mg total) by mouth daily. 07/21/16  Yes Charlaine Utsey, Chrissie NoaWilliam, MD  sitaGLIPtin-metformin (JANUMET) 50-500 MG tablet Take 1 tablet by mouth 2 (two) times daily with a meal. 05/23/16  Yes Joeli Fenner, Chrissie NoaWilliam, MD    No Known Allergies  History reviewed. No pertinent surgical history.  Social History  Substance Use Topics  . Smoking status: Former Games developermoker  . Smokeless tobacco: Never Used  . Alcohol use Yes    History reviewed. No pertinent family history.  Medication list has been reviewed and updated.  Physical Examination: BP 138/84   Pulse 64   Resp 16   Ht 5\' 11"  (1.803 m)   Wt 205 lb (93 kg)   SpO2 99%   BMI 28.59 kg/m   Physical Exam  Constitutional: She appears well-developed and well-nourished.  Cardiovascular: Normal rate, regular rhythm and normal heart sounds.   Pulmonary/Chest: Effort normal and breath sounds normal.  Musculoskeletal: She exhibits no edema.  Neurological: She is alert.  Skin: Skin is warm and dry.  Psychiatric: She has a normal mood and affect. Her behavior is normal.  Nursing note and vitals  reviewed.   Assessment and Plan:  1. Uncontrolled type 2 diabetes mellitus without complication, without long-term current use of insulin (HCC) On Janumet bid - HgB A1c  2. Essential hypertension Adequate control on Prinzide - Basic Metabolic Panel (BMET)  3. Hyperlipidemia, unspecified hyperlipidemia type Stop Crestor, cont Pravachol, recheck lipids next visit  4. Vitamin D deficiency Well controlled on supplement  5. Microalbuminuria On  increased lisinopril dose - Urine Microalbumin w/creat. ratio  6. Overweight (BMI 25.0-29.9) Weight stable, exercise/weight loss discussed  Return in about 3 months (around 11/18/2016).  Dionne Ano. Kingsley Spittle MD [facility]  08/18/2016

## 2016-08-19 ENCOUNTER — Other Ambulatory Visit: Payer: Self-pay

## 2016-08-19 DIAGNOSIS — E1165 Type 2 diabetes mellitus with hyperglycemia: Secondary | ICD-10-CM

## 2016-08-19 DIAGNOSIS — E1121 Type 2 diabetes mellitus with diabetic nephropathy: Secondary | ICD-10-CM

## 2016-08-19 DIAGNOSIS — E118 Type 2 diabetes mellitus with unspecified complications: Secondary | ICD-10-CM

## 2016-08-19 DIAGNOSIS — R739 Hyperglycemia, unspecified: Secondary | ICD-10-CM

## 2016-08-19 LAB — BASIC METABOLIC PANEL
BUN / CREAT RATIO: 19 (ref 12–28)
BUN: 21 mg/dL (ref 8–27)
CALCIUM: 10.3 mg/dL (ref 8.7–10.3)
CO2: 21 mmol/L (ref 20–29)
CREATININE: 1.13 mg/dL — AB (ref 0.57–1.00)
Chloride: 101 mmol/L (ref 96–106)
GFR calc Af Amer: 55 mL/min/{1.73_m2} — ABNORMAL LOW (ref >59)
GFR calc non Af Amer: 48 mL/min/{1.73_m2} — ABNORMAL LOW (ref >59)
GLUCOSE: 98 mg/dL (ref 65–99)
Potassium: 4.7 mmol/L (ref 3.5–5.2)
Sodium: 139 mmol/L (ref 134–144)

## 2016-08-19 LAB — HEMOGLOBIN A1C
Est. average glucose Bld gHb Est-mCnc: 160 mg/dL
HEMOGLOBIN A1C: 7.2 % — AB (ref 4.8–5.6)

## 2016-08-19 LAB — MICROALBUMIN / CREATININE URINE RATIO

## 2016-08-19 MED ORDER — LANCETS MISC: 12 refills | Status: AC

## 2016-08-19 MED ORDER — BLOOD GLUCOSE MONITOR KIT: PACK | 0 refills | Status: DC

## 2016-08-19 MED ORDER — GLUCOSE BLOOD VI STRP: ORAL_STRIP | 12 refills | Status: DC

## 2016-08-23 MED ORDER — GLUCOSE BLOOD VI STRP: ORAL_STRIP | 12 refills | Status: AC

## 2016-08-23 MED ORDER — BLOOD GLUCOSE MONITOR KIT: PACK | 0 refills | Status: DC

## 2016-11-23 ENCOUNTER — Ambulatory Visit: Payer: Medicare Other | Admitting: Family Medicine

## 2016-12-13 ENCOUNTER — Other Ambulatory Visit: Payer: Self-pay | Admitting: Family Medicine

## 2016-12-14 ENCOUNTER — Other Ambulatory Visit: Payer: Self-pay | Admitting: Family Medicine

## 2016-12-14 MED ORDER — LISINOPRIL-HYDROCHLOROTHIAZIDE 10-12.5 MG PO TABS: 1.0000 | ORAL_TABLET | Freq: Every day | ORAL | 3 refills | Status: DC

## 2017-04-24 ENCOUNTER — Ambulatory Visit: Payer: Self-pay | Admitting: Family Medicine

## 2017-04-25 ENCOUNTER — Ambulatory Visit: Payer: Self-pay | Admitting: Family Medicine

## 2017-05-01 ENCOUNTER — Ambulatory Visit (INDEPENDENT_AMBULATORY_CARE_PROVIDER_SITE_OTHER): Payer: Medicare HMO | Admitting: Family Medicine

## 2017-05-01 ENCOUNTER — Telehealth: Payer: Self-pay

## 2017-05-01 ENCOUNTER — Encounter: Payer: Self-pay | Admitting: Family Medicine

## 2017-05-01 VITALS — BP 126/82 | HR 62 | Resp 16 | Ht 71.0 in | Wt 215.0 lb

## 2017-05-01 DIAGNOSIS — L509 Urticaria, unspecified: Secondary | ICD-10-CM | POA: Diagnosis not present

## 2017-05-01 DIAGNOSIS — IMO0001 Reserved for inherently not codable concepts without codable children: Secondary | ICD-10-CM

## 2017-05-01 DIAGNOSIS — N898 Other specified noninflammatory disorders of vagina: Secondary | ICD-10-CM | POA: Diagnosis not present

## 2017-05-01 DIAGNOSIS — E663 Overweight: Secondary | ICD-10-CM | POA: Diagnosis not present

## 2017-05-01 DIAGNOSIS — E1165 Type 2 diabetes mellitus with hyperglycemia: Secondary | ICD-10-CM | POA: Diagnosis not present

## 2017-05-01 DIAGNOSIS — N3281 Overactive bladder: Secondary | ICD-10-CM | POA: Diagnosis not present

## 2017-05-01 DIAGNOSIS — I10 Essential (primary) hypertension: Secondary | ICD-10-CM | POA: Diagnosis not present

## 2017-05-01 DIAGNOSIS — E559 Vitamin D deficiency, unspecified: Secondary | ICD-10-CM

## 2017-05-01 DIAGNOSIS — E785 Hyperlipidemia, unspecified: Secondary | ICD-10-CM

## 2017-05-01 NOTE — Telephone Encounter (Signed)
Called to schedule AWV w/ [facility]. No answer.

## 2017-05-01 NOTE — Progress Notes (Signed)
Date:  05/01/2017   Name:  [name]   DOB:  1940-06-27   MRN:  [id number]  PCP:  Adline Potter, MD    Chief Complaint: Rash (whelps on back x 1 month off and on at random times worse in evening and itching. ) and Diabetes (has been having more urination since 2017 when diagnosed  with diabetes she noticed more urinating and she has vaginal odor. )   History of Present Illness:  This is a 77 y.o. female seen for 9 month f/u. C/o pruritic rash on back and arms past month, no new exposures noted. Also c/o vaginal odor without itching or discharge and increased urinary urgency. Otherwise feels well, last home BG 137, admits to drinking sweet tea and eating ice cream daily. Declines all imms.  Review of Systems:  Review of Systems  Constitutional: Negative for chills and fever.  Respiratory: Negative for cough and shortness of breath.   Cardiovascular: Negative for chest pain and leg swelling.  Endocrine: Negative for polydipsia and polyuria.  Genitourinary: Negative for dysuria.  Neurological: Negative for syncope and light-headedness.    Patient Active Problem List   Diagnosis Date Noted  . 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 Hypertension 08/18/2016  . Microalbuminuria 05/24/2016  . Diabetes mellitus type 2, uncontrolled, without complications ([place]) 79/03/4095  . Cellulitis of left foot 05/23/2016  . Diabetic ulcer of left foot ([place]) 05/23/2016  . Hyperlipidemia 05/23/2016  . Vitamin D deficiency 05/23/2016  . B12 deficiency 05/23/2016  . Short-term memory loss 05/23/2016  . Overweight (BMI 25.0-29.9) 05/23/2016  . Edema of right lower extremity 05/23/2016    Prior to Admission medications   Medication Sig Start Date End Date Taking? Authorizing Provider  aspirin 81 MG tablet Take 81 mg by mouth daily.   Yes [provider]  blood glucose meter kit and supplies KIT Dispense based on patient and insurance preference.ICD 10- E11.65 Diabetes Mellitus 2  Uncontrolled.Directions: Use to check Blood Sugar up to four times daily. 08/23/16  Yes Glean Hess, MD  cetirizine (ZYRTEC) 10 MG tablet Take 10 mg by mouth daily.   Yes [provider]  Cholecalciferol (VITAMIN D) 2000 units CAPS Take 1 capsule (2,000 Units total) by mouth daily. 06/20/16  Yes Giulianna Rocha, Gwyndolyn Saxon, MD  glucose blood test strip Use per insurance brand. Diagnosis:Diabetes Mellitus 2 Uncontrolled without Complications = D53. 65. Check Blood Sugar up to four times daily. 08/23/16  Yes Glean Hess, MD  Lancets MISC Use per insurance brand. To use with Glucometer to monitor sugar up to four times daily. 08/19/16  Yes Anika Shore, Gwyndolyn Saxon, MD  lisinopril-hydrochlorothiazide (PRINZIDE,ZESTORETIC) 10-12.5 MG tablet Take 1 tablet daily by mouth. 12/14/16  Yes Marshell Dilauro, Gwyndolyn Saxon, MD  Multiple Vitamin (MULTIVITAMIN) capsule Take 1 capsule by mouth daily.   Yes [provider]  pravastatin (PRAVACHOL) 40 MG tablet TAKE 1 TABLET BY MOUTH ONCE DAILY 12/14/16  Yes Yonatan Guitron, Gwyndolyn Saxon, MD  sitaGLIPtin-metformin (JANUMET) 50-500 MG tablet Take 1 tablet by mouth 2 (two) times daily with a meal. 05/23/16  Yes Annlee Glandon, Gwyndolyn Saxon, MD    No Known Allergies  History reviewed. No pertinent surgical history.  Social History   Tobacco Use  . Smoking status: Former Research scientist (life sciences)  . Smokeless tobacco: Never Used  Substance Use Topics  . Alcohol use: Yes  . Drug use: Not on file    History reviewed. No pertinent family history.  Medication list has been reviewed and updated.  Physical Examination: BP 126/82   Pulse 62  Resp 16   Ht _0  (1.803 m)   Wt 215 lb (97.5 kg)   SpO2 98%   BMI 29.99 kg/m   Physical Exam  Constitutional: She appears well-developed and well-nourished.  Cardiovascular: Normal rate and normal heart sounds.  Pulmonary/Chest: Effort normal and breath sounds normal.  Musculoskeletal: She exhibits no edema.  Wearing B compression hose  Neurological: She is alert.  Skin:  Skin is warm and dry.  Scattered erythematous plaques over back and BUE  Psychiatric: She has a normal mood and affect. Her behavior is normal.  Nursing note and vitals reviewed.   Assessment and Plan:  1. Diabetes mellitus type 2, uncontrolled, without complications (HCC) Unclear control on Janumet bid, discussed d/c sweet tea and eating ice cream once weekly only, declines MNT referral - HgB A1c - Urine Microalbumin w/creat. ratio  2. Urticaria Unclear trigger, trial Zyrtec daily x 2 weeks, call if sxs persist/recur  3. Essential hypertension Well controlled on Prinzide - Basic Metabolic Panel (BMET)  4. Hyperlipidemia, unspecified hyperlipidemia type On Pravachol - Lipid Profile  5. Detrusor instability Consider trial Myrbetriq if a1c/MCR ok  6. Vaginal odor Consider Diflucan dose if labs ok  7. Vitamin D deficiency Well controlled on supplement  8. Overweight (BMI 25.0-29.9) Weight up 10#, diet/exercise/weight loss discussed  9. Med review Consider d/c asa  Return in about 3 months (around 08/01/2017).  Satira Anis. [facility]  05/01/2017

## 2017-05-01 NOTE — Patient Instructions (Signed)
Begin OTC Zyrtec (cetirizine) daily for two weeks, call if rash persists or recurs.

## 2017-05-02 LAB — BASIC METABOLIC PANEL
BUN/Creatinine Ratio: 19 (ref 12–28)
BUN: 19 mg/dL (ref 8–27)
CALCIUM: 9.9 mg/dL (ref 8.7–10.3)
CHLORIDE: 101 mmol/L (ref 96–106)
CO2: 21 mmol/L (ref 20–29)
Creatinine, Ser: 0.99 mg/dL (ref 0.57–1.00)
GFR calc Af Amer: 64 mL/min/{1.73_m2} (ref >59)
GFR, EST NON AFRICAN AMERICAN: 56 mL/min/{1.73_m2} — AB (ref >59)
Glucose: 105 mg/dL — ABNORMAL HIGH (ref 65–99)
POTASSIUM: 4.6 mmol/L (ref 3.5–5.2)
Sodium: 141 mmol/L (ref 134–144)

## 2017-05-02 LAB — LIPID PANEL
Chol/HDL Ratio: 3.1 ratio (ref 0.0–4.4)
Cholesterol, Total: 154 mg/dL (ref 100–199)
HDL: 50 mg/dL (ref >39)
LDL Calculated: 85 mg/dL (ref 0–99)
Triglycerides: 96 mg/dL (ref 0–149)
VLDL Cholesterol Cal: 19 mg/dL (ref 5–40)

## 2017-05-02 LAB — HEMOGLOBIN A1C
Est. average glucose Bld gHb Est-mCnc: 157 mg/dL
Hgb A1c MFr Bld: 7.1 % — ABNORMAL HIGH (ref 4.8–5.6)

## 2017-05-02 LAB — MICROALBUMIN / CREATININE URINE RATIO
Creatinine, Urine: 102.5 mg/dL
MICROALB/CREAT RATIO: 18.9 mg/g{creat} (ref 0.0–30.0)
MICROALBUM., U, RANDOM: 19.4 ug/mL

## 2017-05-03 ENCOUNTER — Other Ambulatory Visit: Payer: Self-pay | Admitting: Family Medicine

## 2017-05-03 MED ORDER — FLUCONAZOLE 150 MG PO TABS: 150.0000 mg | ORAL_TABLET | Freq: Once | ORAL | 0 refills | Status: AC

## 2017-07-12 ENCOUNTER — Ambulatory Visit (INDEPENDENT_AMBULATORY_CARE_PROVIDER_SITE_OTHER): Payer: Medicare HMO

## 2017-07-12 VITALS — BP 130/70 | HR 62 | Temp 98.6°F | Resp 12 | Ht 71.0 in | Wt 205.0 lb

## 2017-07-12 DIAGNOSIS — Z0000 Encounter for general adult medical examination without abnormal findings: Secondary | ICD-10-CM

## 2017-07-12 DIAGNOSIS — E119 Type 2 diabetes mellitus without complications: Secondary | ICD-10-CM

## 2017-07-12 NOTE — Progress Notes (Addendum)
Subjective:   [name] is a 77 y.o. female who presents for Medicare Annual (Subsequent) preventive examination.  Review of Systems:  N/A Cardiac Risk Factors include: advanced age (>50mn, >>40women);diabetes mellitus;dyslipidemia;hypertension;sedentary lifestyle     Objective:     Vitals: BP 130/70 (BP Location: Right Arm, Patient Position: Sitting, Cuff Size: Normal)   Pulse 62   Temp 98.6 F (37 C) (Oral)   Resp 12   Ht '5\' 11"'  (1.803 m)   Wt 205 lb (93 kg)   SpO2 94%   BMI 28.59 kg/m   Body mass index is 28.59 kg/m.  Advanced Directives 07/12/2017 08/18/2016 06/17/2016 05/23/2016  Does Patient Have a Medical Advance Directive? No No No No  Would patient like information on creating a medical advance directive? Yes (MAU/Ambulatory/Procedural Areas - Information given) - - -    Tobacco Social History   Tobacco Use  Smoking Status Former Smoker  . Packs/day: 0.25  . Years: 50.00  . Pack years: 12.50  . Types: Cigarettes  . Last attempt to quit: 2009  . Years since quitting: 10.4  Smokeless Tobacco Never Used  Tobacco Comment   smoking cessation materials not required     Counseling given: No Comment: smoking cessation materials not required  Clinical Intake:  Pre-visit preparation completed: Yes  Pain : No/denies pain   BMI - recorded: 29.99 Nutritional Status: BMI 25 -29 Overweight Nutritional Risks: None  Nutrition Risk Assessment: Has the patient had any N/V/D within the last 2 months?  No Does the patient have any non-healing wounds?  No Has the patient had any unintentional weight loss or weight gain?  No  Is the patient diabetic?  Yes If diabetic, was a CBG obtained today?  No Did the patient bring in their glucometer from home?  No Comments: Pt does not monitor CBG's. Denies any financial strains with the device or supplies.  Diabetic Exams: Diabetic Eye Exam: Completed 04/07/16. Pt states she is not established with a physician to  complete this exam. Referral placed for pt to establish care with a provider for completion of diabetic eye exam. Message sent to referral coordinator for scheduling purposes. Pt aware that she will receive a call from our office re: her appt. Verbalized acceptance and understanding. Diabetic Foot Exam: Completed 07/19/16. Pt is scheduled for diabetic foot exam on 08/01/17 with Dr. JRonnald Ramp  How often do you need to have someone help you when you read instructions, pamphlets, or other written materials from your doctor or pharmacy?: 1 - Never  Interpreter Needed?: No  Information entered by :: AIdell Pickles LPN  Past Medical History:  Diagnosis Date  . Diabetes mellitus without complication ([place]   . Peripheral vascular disease ([place]    History reviewed. No pertinent surgical history. Family History  Family history unknown: Yes   Social History   Socioeconomic History  . Marital status: Married    Spouse name: AArnell Sieving . Number of children: 2  . Years of education: Not on file  . Highest education level: 12th grade  Occupational History  . Occupation: Retired  SScientific laboratory technician . Financial resource strain: Not hard at all  . Food insecurity:    Worry: Never true    Inability: Never true  . Transportation needs:    Medical: No    Non-medical: No  Tobacco Use  . Smoking status: Former Smoker    Packs/day: 0.25    Years: 50.00    Pack years: 12.50  Types: Cigarettes    Last attempt to quit: 2009    Years since quitting: 10.4  . Smokeless tobacco: Never Used  . Tobacco comment: smoking cessation materials not required  Substance and Sexual Activity  . Alcohol use: Not Currently  . Drug use: Never  . Sexual activity: Not Currently  Lifestyle  . Physical activity:    Days per week: 0 days    Minutes per session: 0 min  . Stress: Not at all  Relationships  . Social connections:    Talks on phone: Patient refused    Gets together: Patient refused    Attends religious service:  Patient refused    Active member of club or organization: Patient refused    Attends meetings of clubs or organizations: Patient refused    Relationship status: Married  Other Topics Concern  . Not on file  Social History Narrative  . Not on file    Outpatient Encounter Medications as of 07/12/2017  Medication Sig  . blood glucose meter kit and supplies KIT Dispense based on patient and insurance preference.ICD 10- E11.65 Diabetes Mellitus 2 Uncontrolled.Directions: Use to check Blood Sugar up to four times daily.  . Cholecalciferol (VITAMIN D) 2000 units CAPS Take 1 capsule (2,000 Units total) by mouth daily.  Marland Kitchen glucose blood test strip Use per insurance brand. Diagnosis:Diabetes Mellitus 2 Uncontrolled without Complications = K44. 65. Check Blood Sugar up to four times daily.  . Lancets MISC Use per insurance brand. To use with Glucometer to monitor sugar up to four times daily.  Marland Kitchen lisinopril-hydrochlorothiazide (PRINZIDE,ZESTORETIC) 10-12.5 MG tablet Take 1 tablet daily by mouth.  . Multiple Vitamin (MULTIVITAMIN) capsule Take 1 capsule by mouth daily.  . sitaGLIPtin-metformin (JANUMET) 50-500 MG tablet Take 1 tablet by mouth 2 (two) times daily with a meal.  . cetirizine (ZYRTEC) 10 MG tablet Take 10 mg by mouth daily.  . pravastatin (PRAVACHOL) 40 MG tablet TAKE 1 TABLET BY MOUTH ONCE DAILY   No facility-administered encounter medications on file as of 07/12/2017.     Activities of Daily Living In your present state of health, do you have any difficulty performing the following activities: 07/12/2017  Hearing? N  Comment denies hearig aids  Vision? N  Comment wears eyeglasses  Difficulty concentrating or making decisions? N  Walking or climbing stairs? N  Dressing or bathing? N  Doing errands, shopping? N  Preparing Food and eating ? N  Comment full set upper and lower dentures  Using the Toilet? N  In the past six months, have you accidently leaked urine? N  Do you have  problems with loss of bowel control? N  Managing your Medications? N  Managing your Finances? N  Housekeeping or managing your Housekeeping? N  Some recent data might be hidden    Patient Care Team: Juline Patch, MD as PCP - General (Family Medicine)    Assessment:   This is a routine wellness examination for Malee.  Exercise Activities and Dietary recommendations Current Exercise Habits: The patient does not participate in regular exercise at present, Exercise limited by: None identified  Goals    . DIET - INCREASE WATER INTAKE     Recommend to drink at least 6-8 8oz glasses of water per day.       Fall Risk Fall Risk  07/12/2017 05/23/2016  Falls in the past year? No No  Risk for fall due to : Impaired vision -  Risk for fall due to: Comment wears eyeglasses -  FALL RISK PREVENTION PERTAINING TO HOME: Is your home free of loose throw rugs in walkways, pet beds, electrical cords, etc? Yes Is there adequate lighting in your home to reduce risk of falls?  Yes Are there stairs in or around your home WITH handrails? No  ASSISTIVE DEVICES UTILIZED TO PREVENT FALLS: Use of a cane, walker or w/c? No Grab bars in the bathroom? No  Shower chair or a place to sit while bathing? Yes An elevated toilet seat or a handicapped toilet? No  Timed Get Up and Go Performed: Yes. Pt ambulated 10 feet within 13 sec. Gait slow, steady and without the use of an assistive device. No intervention required at this time. Fall risk prevention has been discussed.  Community Resource Referral:  Pt declined my offer to send [employer] Referral to Care Guide for installation of grab bars in the shower or an elevated toilet seat.  Depression Screen PHQ 2/9 Scores 07/12/2017 05/23/2016  PHQ - 2 Score 0 0  PHQ- 9 Score 0 -     Cognitive Function     6CIT Screen 07/12/2017  What Year? 0 points  What month? 3 points  What time? 0 points  Count back from 20 0 points  Months in reverse 2  points  Repeat phrase 10 points  Total Score 15     There is no immunization history on file for this patient.   Qualifies for Shingles Vaccine? Yes. Due for Shingrix. Education has been provided regarding the importance of this vaccine. Pt has been advised to call her insurance company to determine her out of pocket expense. Advised she may also receive this vaccine at her local pharmacy or Health Dept. Verbalized acceptance and understanding but adamantly declined to ever have this vaccine now or in the near future.  Overdue for Flu vaccine. Education has been provided regarding the importance of this vaccine and advised to receive when available. Verbalized acceptance and understanding but adamantly declined to ever have this vaccine now or in the near future.  Due for Pneumoccocal vaccine. Declined my offer to administer today. Education has been provided regarding the importance of this vaccine but still declined. Pt has been advised to call our office if she should change her mind and wish to receive this vaccine. Also advised she may receive this vaccine at her local pharmacy or Health Dept. Pt is aware to provide a copy of her vaccination record if she chooses to receive this vaccine at her local pharmacy. Verbalized acceptance and understanding but adamantly declined to ever have this vaccine now or in the near future.  Due for Tdap vaccine. Education has been provided regarding the importance of this vaccine. Pt has been advised she may receive this vaccine at her local pharmacy or Health Dept. Also advised to provide a copy of her vaccination record if she chooses to receive this vaccine at her local pharmacy. Verbalized acceptance and understanding but adamantly declined to ever have this vaccine now or in the near future.  Screening Tests Health Maintenance  Topic Date Due  . OPHTHALMOLOGY EXAM  04/07/2017  . INFLUENZA VACCINE  11/16/2017 (Originally 09/07/2017)  . TETANUS/TDAP   02/07/2018 (Originally 10/31/1959)  . DEXA SCAN  07/13/2018 (Originally 10/30/2005)  . PNA vac Low Risk Adult (1 of 2 - PCV13) 07/13/2018 (Originally 10/30/2005)  . FOOT EXAM  07/19/2017  . HEMOGLOBIN A1C  11/01/2017    Cancer Screenings: Lung: Low Dose CT Chest recommended if Age 49-80 years, 30 pack-year  currently smoking OR have quit w/in 15years. Patient does not qualify. Breast Screening: No longer required  Up to date of Bone Density/Dexa? No. Declined my offer to order this screening. Colorectal: No longer required  Additional Screenings: Hepatitis C Screening: Does not qualify    Plan:  I have personally reviewed and addressed the Medicare Annual Wellness questionnaire and have noted the following in the patient's chart:  A. Medical and social history B. Use of alcohol, tobacco or illicit drugs  C. Current medications and supplements D. Functional ability and status E.  Nutritional status F.  Physical activity G. Advance directives H. List of other physicians I.  Hospitalizations, surgeries, and ER visits in previous 12 months J.  [place] such as hearing and vision if needed, cognitive and depression L. Referrals and appointments  In addition, I have reviewed and discussed with patient certain preventive protocols, quality metrics, and best practice recommendations. A written personalized care plan for preventive services as well as general preventive health recommendations were provided to patient.  Signed,  Aleatha Borer, LPN Nurse Health Advisor  MD Recommendations: Due for Shingrix. Education has been provided regarding the importance of this vaccine. Pt has been advised to call her insurance company to determine her out of pocket expense. Advised she may also receive this vaccine at her local pharmacy or Health Dept. Verbalized acceptance and understanding but adamantly declined to ever have this vaccine now or in the near future.  Overdue for Flu  vaccine. Education has been provided regarding the importance of this vaccine and advised to receive when available. Verbalized acceptance and understanding but adamantly declined to ever have this vaccine now or in the near future.  Due for Pneumoccocal vaccine. Declined my offer to administer today. Education has been provided regarding the importance of this vaccine but still declined. Pt has been advised to call our office if she should change her mind and wish to receive this vaccine. Also advised she may receive this vaccine at her local pharmacy or Health Dept. Pt is aware to provide a copy of her vaccination record if she chooses to receive this vaccine at her local pharmacy. Verbalized acceptance and understanding but adamantly declined to ever have this vaccine now or in the near future.  Due for Tdap vaccine. Education has been provided regarding the importance of this vaccine. Pt has been advised she may receive this vaccine at her local pharmacy or Health Dept. Also advised to provide a copy of her vaccination record if she chooses to receive this vaccine at her local pharmacy. Verbalized acceptance and understanding but adamantly declined to ever have this vaccine now or in the near future.  Bone Density/Dexa: Declined my offer to order this screening.  Diabetic Eye Exam: Completed 04/07/16. Pt states she is not established with a physician to complete this exam. Referral placed for pt to establish care with a provider for completion of diabetic eye exam. Message sent to referral coordinator for scheduling purposes. Pt aware that she will receive a call from our office re: her appt. Verbalized acceptance and understanding.  Diabetic Foot Exam: Completed 07/19/16. Pt is scheduled for diabetic foot exam on 08/01/17 with Dr. Ronnald Ramp.  6 CIT score = 15

## 2017-07-12 NOTE — Patient Instructions (Signed)
[name] , Thank you for taking time to come for your Medicare Wellness Visit. I appreciate your ongoing commitment to your health goals. Please review the following plan we discussed and let me know if I can assist you in the future.   Screening recommendations/referrals: Colorectal Screening: No longer required Mammogram: No longer required Bone Density: Declined  Vision and Dental Exams: Recommended annual ophthalmology exams for early detection of glaucoma and other disorders of the eye Recommended annual dental exams for proper oral hygiene  Diabetic Exams: Recommended annual diabetic eye exams for early detection of retinopathy Recommended annual diabetic foot exams for early detection of peripheral neuropathy.  Diabetic Eye Exam: Please schedule an appointment with your ophthalmologist Diabetic Foot Exam: Up to date  Vaccinations: Influenza vaccine: Overdue Pneumococcal vaccine: Declined Tdap vaccine: Declined. Please call your insurance company to determine your out of pocket expense. You may also receive this vaccine at your local pharmacy or Health Dept. Shingles vaccine: Please call your insurance company to determine your out of pocket expense for the Shingrix vaccine. You may also receive this vaccine at your local pharmacy or Health Dept.  Advanced directives: Advance directive discussed with you today. I have provided a copy for you to complete at home and have notarized. Once this is complete please bring a copy in to our office so we can scan it into your chart.  Conditions/risks identified: Recommend to drink at least 6-8 8oz glasses of water per day.  Next appointment: Please schedule your Annual Wellness Visit with your Nurse Health Advisor in one year.  Preventive Care 5265 Years and Older, Female Preventive care refers to lifestyle choices and visits with your health care provider that can promote health and wellness. What does preventive care include?  A  yearly physical exam. This is also called an annual well check.  Dental exams once or twice a year.  Routine eye exams. Ask your health care provider how often you should have your eyes checked.  Personal lifestyle choices, including:  Daily care of your teeth and gums.  Regular physical activity.  Eating a healthy diet.  Avoiding tobacco and drug use.  Limiting alcohol use.  Practicing safe sex.  Taking low-dose aspirin every day.  Taking vitamin and mineral supplements as recommended by your health care provider. What happens during an annual well check? The services and screenings done by your health care provider during your annual well check will depend on your age, overall health, lifestyle risk factors, and family history of disease. Counseling  Your health care provider may ask you questions about your:  Alcohol use.  Tobacco use.  Drug use.  Emotional well-being.  Home and relationship well-being.  Sexual activity.  Eating habits.  History of falls.  Memory and ability to understand (cognition).  Work and work Astronomerenvironment.  Reproductive health. Screening  You may have the following tests or measurements:  Height, weight, and BMI.  Blood pressure.  Lipid and cholesterol levels. These may be checked every 5 years, or more frequently if you are over 77 years old.  Skin check.  Lung cancer screening. You may have this screening every year starting at age 77 if you have a 30-pack-year history of smoking and currently smoke or have quit within the past 15 years.  Fecal occult blood test (FOBT) of the stool. You may have this test every year starting at age 77.  Flexible sigmoidoscopy or colonoscopy. You may have a sigmoidoscopy every 5 years or a colonoscopy  every 10 years starting at age 41.  Hepatitis C blood test.  Hepatitis B blood test.  Sexually transmitted disease (STD) testing.  Diabetes screening. This is done by checking your blood  sugar (glucose) after you have not eaten for a while (fasting). You may have this done every 1-3 years.  Bone density scan. This is done to screen for osteoporosis. You may have this done starting at age 62.  Mammogram. This may be done every 1-2 years. Talk to your health care provider about how often you should have regular mammograms. Talk with your health care provider about your test results, treatment options, and if necessary, the need for more tests. Vaccines  Your health care provider may recommend certain vaccines, such as:  Influenza vaccine. This is recommended every year.  Tetanus, diphtheria, and acellular pertussis (Tdap, Td) vaccine. You may need a Td booster every 10 years.  Zoster vaccine. You may need this after age 13.  Pneumococcal 13-valent conjugate (PCV13) vaccine. One dose is recommended after age 68.  Pneumococcal polysaccharide (PPSV23) vaccine. One dose is recommended after age 69. Talk to your health care provider about which screenings and vaccines you need and how often you need them. This information is not intended to replace advice given to you by your health care provider. Make sure you discuss any questions you have with your health care provider. Document Released: 02/20/2015 Document Revised: 10/14/2015 Document Reviewed: 11/25/2014 Elsevier Interactive Patient Education  2017 [place] Prevention in the Home Falls can cause injuries. They can happen to people of all ages. There are many things you can do to make your home safe and to help prevent falls. What can I do on the outside of my home?  Regularly fix the edges of walkways and driveways and fix any cracks.  Remove anything that might make you trip as you walk through a door, such as a raised step or threshold.  Trim any bushes or trees on the path to your home.  Use bright outdoor lighting.  Clear any walking paths of anything that might make someone trip, such as rocks or  tools.  Regularly check to see if handrails are loose or broken. Make sure that both sides of any steps have handrails.  Any raised decks and porches should have guardrails on the edges.  Have any leaves, snow, or ice cleared regularly.  Use sand or salt on walking paths during winter.  Clean up any spills in your garage right away. This includes oil or grease spills. What can I do in the bathroom?  Use night lights.  Install grab bars by the toilet and in the tub and shower. Do not use towel bars as grab bars.  Use non-skid mats or decals in the tub or shower.  If you need to sit down in the shower, use a plastic, non-slip stool.  Keep the floor dry. Clean up any water that spills on the floor as soon as it happens.  Remove soap buildup in the tub or shower regularly.  Attach bath mats securely with double-sided non-slip rug tape.  Do not have throw rugs and other things on the floor that can make you trip. What can I do in the bedroom?  Use night lights.  Make sure that you have a light by your bed that is easy to reach.  Do not use any sheets or blankets that are too big for your bed. They should not hang down onto the floor.  Have a firm chair that has side arms. You can use this for support while you get dressed.  Do not have throw rugs and other things on the floor that can make you trip. What can I do in the kitchen?  Clean up any spills right away.  Avoid walking on wet floors.  Keep items that you use a lot in easy-to-reach places.  If you need to reach something above you, use a strong step stool that has a grab bar.  Keep electrical cords out of the way.  Do not use floor polish or wax that makes floors slippery. If you must use wax, use non-skid floor wax.  Do not have throw rugs and other things on the floor that can make you trip. What can I do with my stairs?  Do not leave any items on the stairs.  Make sure that there are handrails on both  sides of the stairs and use them. Fix handrails that are broken or loose. Make sure that handrails are as long as the stairways.  Check any carpeting to make sure that it is firmly attached to the stairs. Fix any carpet that is loose or worn.  Avoid having throw rugs at the top or bottom of the stairs. If you do have throw rugs, attach them to the floor with carpet tape.  Make sure that you have a light switch at the top of the stairs and the bottom of the stairs. If you do not have them, ask someone to add them for you. What else can I do to help prevent falls?  Wear shoes that:  Do not have high heels.  Have rubber bottoms.  Are comfortable and fit you well.  Are closed at the toe. Do not wear sandals.  If you use a stepladder:  Make sure that it is fully opened. Do not climb a closed stepladder.  Make sure that both sides of the stepladder are locked into place.  Ask someone to hold it for you, if possible.  Clearly mark and make sure that you can see:  Any grab bars or handrails.  First and last steps.  Where the edge of each step is.  Use tools that help you move around (mobility aids) if they are needed. These include:  Canes.  Walkers.  Scooters.  Crutches.  Turn on the lights when you go into a dark area. Replace any light bulbs as soon as they burn out.  Set up your furniture so you have a clear path. Avoid moving your furniture around.  If any of your floors are uneven, fix them.  If there are any pets around you, be aware of where they are.  Review your medicines with your doctor. Some medicines can make you feel dizzy. This can increase your chance of falling. Ask your doctor what other things that you can do to help prevent falls. This information is not intended to replace advice given to you by your health care provider. Make sure you discuss any questions you have with your health care provider. Document Released: 11/20/2008 Document Revised:  07/02/2015 Document Reviewed: 02/28/2014 Elsevier Interactive Patient Education  2017 [employer].

## 2017-07-27 ENCOUNTER — Other Ambulatory Visit: Payer: Self-pay | Admitting: Family Medicine

## 2017-08-01 ENCOUNTER — Ambulatory Visit: Payer: Medicare HMO | Admitting: Family Medicine

## 2017-08-07 ENCOUNTER — Ambulatory Visit: Payer: Medicare HMO | Admitting: Family Medicine

## 2017-12-26 ENCOUNTER — Other Ambulatory Visit: Payer: Self-pay | Admitting: Family Medicine

## 2017-12-28 ENCOUNTER — Encounter: Payer: Self-pay | Admitting: Internal Medicine

## 2017-12-29 ENCOUNTER — Encounter: Payer: Self-pay | Admitting: Internal Medicine

## 2017-12-29 ENCOUNTER — Ambulatory Visit (INDEPENDENT_AMBULATORY_CARE_PROVIDER_SITE_OTHER): Payer: Medicare HMO | Admitting: Internal Medicine

## 2017-12-29 VITALS — BP 126/78 | HR 61 | Ht 71.0 in | Wt 214.2 lb

## 2017-12-29 DIAGNOSIS — I10 Essential (primary) hypertension: Secondary | ICD-10-CM

## 2017-12-29 DIAGNOSIS — E1151 Type 2 diabetes mellitus with diabetic peripheral angiopathy without gangrene: Secondary | ICD-10-CM

## 2017-12-29 DIAGNOSIS — E1169 Type 2 diabetes mellitus with other specified complication: Secondary | ICD-10-CM

## 2017-12-29 DIAGNOSIS — E663 Overweight: Secondary | ICD-10-CM

## 2017-12-29 DIAGNOSIS — I872 Venous insufficiency (chronic) (peripheral): Secondary | ICD-10-CM | POA: Diagnosis not present

## 2017-12-29 DIAGNOSIS — E11622 Type 2 diabetes mellitus with other skin ulcer: Secondary | ICD-10-CM

## 2017-12-29 DIAGNOSIS — L97909 Non-pressure chronic ulcer of unspecified part of unspecified lower leg with unspecified severity: Secondary | ICD-10-CM | POA: Diagnosis not present

## 2017-12-29 DIAGNOSIS — E785 Hyperlipidemia, unspecified: Secondary | ICD-10-CM | POA: Diagnosis not present

## 2017-12-29 MED ORDER — SITAGLIPTIN PHOS-METFORMIN HCL 50-500 MG PO TABS: 1.0000 | ORAL_TABLET | Freq: Two times a day (BID) | ORAL | 5 refills | Status: DC

## 2017-12-29 NOTE — Patient Instructions (Addendum)
Schedule Diabetic eye exam at [facility] in [place]: [phone]  [facility]  [phone]

## 2017-12-29 NOTE — Progress Notes (Signed)
Date:  12/29/2017   Name:  [name]   DOB:  [birth year]   MRN:  [id number]  Previous patient of Dr. Vicente Masson.  Tranferring to me for PCP.  Chief Complaint: Hypertension and Diabetes (Has not checked BS at home for a while. Needs new meter sent in. Old one does not work properly anymore. )  Diabetes  She presents for her follow-up diabetic visit. She has type 2 diabetes mellitus. Her disease course has been stable. Pertinent negatives for hypoglycemia include no headaches or tremors. Pertinent negatives for diabetes include no chest pain, no fatigue, no polydipsia and no polyuria. Current diabetic treatment includes oral agent (dual therapy). She is compliant with treatment most of the time. An ACE inhibitor/angiotensin II receptor blocker is being taken. Eye exam is not current.  Hypertension  This is a chronic problem. The problem is controlled. Pertinent negatives include no chest pain, headaches, palpitations or shortness of breath. Past treatments include ACE inhibitors.  Hyperlipidemia  This is a chronic problem. The problem is controlled. Pertinent negatives include no chest pain or shortness of breath. Current antihyperlipidemic treatment includes statins. The current treatment provides significant improvement of lipids.   Lab Results  Component Value Date   HGBA1C 7.1 (H) 05/01/2017   Lab Results  Component Value Date   CREATININE 0.99 05/01/2017   BUN 19 05/01/2017   NA 141 05/01/2017   K 4.6 05/01/2017   CL 101 05/01/2017   CO2 21 05/01/2017   Lab Results  Component Value Date   CHOL 154 05/01/2017   HDL 50 05/01/2017   LDLCALC 85 05/01/2017   TRIG 96 05/01/2017   CHOLHDL 3.1 05/01/2017   Lab Results  Component Value Date   TSH 1.420 05/23/2016     Review of Systems  Constitutional: Negative for appetite change, fatigue, fever and unexpected weight change.  HENT: Negative for tinnitus and trouble swallowing.   Eyes: Negative for visual disturbance.    Respiratory: Negative for cough, chest tightness and shortness of breath.   Cardiovascular: Negative for chest pain, palpitations and leg swelling.  Gastrointestinal: Negative for abdominal pain.  Endocrine: Negative for polydipsia and polyuria.  Genitourinary: Negative for dysuria and hematuria.  Musculoskeletal: Negative for arthralgias.  Skin: Positive for color change and rash.  Neurological: Negative for tremors, numbness and headaches.  Hematological: Negative for adenopathy. Does not bruise/bleed easily.  Psychiatric/Behavioral: Negative for dysphoric mood and sleep disturbance.    Patient Active Problem List   Diagnosis Date Noted  . 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 Hypertension 08/18/2016  . Microalbuminuria 05/24/2016  . Controlled type 2 diabetes mellitus with ulcer of lower extremity ([place]) 05/23/2016  . Diabetic ulcer of left foot ([place]) 05/23/2016  . Hyperlipidemia associated with type 2 diabetes mellitus ([place]) 05/23/2016  . Vitamin D deficiency 05/23/2016  . B12 deficiency 05/23/2016  . Short-term memory loss 05/23/2016  . Overweight (BMI 25.0-29.9) 05/23/2016  . Edema of right lower extremity 05/23/2016    No Known Allergies  History reviewed. No pertinent surgical history.  Social History   Tobacco Use  . Smoking status: Former Smoker    Packs/day: 0.25    Years: 50.00    Pack years: 12.50    Types: Cigarettes    Last attempt to quit: 2009    Years since quitting: 10.8  . Smokeless tobacco: Never Used  . Tobacco comment: smoking cessation materials not required  Substance Use Topics  . Alcohol use: Not Currently  . Drug use: Never  Medication list has been reviewed and updated.  Current Meds  Medication Sig  . blood glucose meter kit and supplies KIT Dispense based on patient and insurance preference.ICD 10- E11.65 Diabetes Mellitus 2 Uncontrolled.Directions: Use to check Blood Sugar up to four times daily.  . Cholecalciferol (VITAMIN D)  2000 units CAPS Take 1 capsule (2,000 Units total) by mouth daily.  Marland Kitchen glucose blood test strip Use per insurance brand. Diagnosis:Diabetes Mellitus 2 Uncontrolled without Complications = Z61. 65. Check Blood Sugar up to four times daily.  Marland Kitchen JANUMET 50-500 MG tablet TAKE 1 TABLET BY MOUTH TWICE DAILY WITH  A  MEAL  . Lancets MISC Use per insurance brand. To use with Glucometer to monitor sugar up to four times daily.  Marland Kitchen lisinopril-hydrochlorothiazide (PRINZIDE,ZESTORETIC) 10-12.5 MG tablet Take 1 tablet daily by mouth.  . Multiple Vitamin (MULTIVITAMIN) capsule Take 1 capsule by mouth daily.  . pravastatin (PRAVACHOL) 40 MG tablet Take 40 mg by mouth daily.    PHQ 2/9 Scores 12/29/2017 07/12/2017 05/23/2016  PHQ - 2 Score 0 0 0  PHQ- 9 Score - 0 -    Physical Exam  Constitutional: She is oriented to person, place, and time. She appears well-developed. No distress.  HENT:  Head: Normocephalic and atraumatic.  Neck: Normal range of motion. Neck supple.  Cardiovascular: Normal rate, regular rhythm and normal heart sounds.  Pulmonary/Chest: Effort normal and breath sounds normal. No respiratory distress.  Musculoskeletal: Normal range of motion.  Lymphadenopathy:    She has no cervical adenopathy.  Neurological: She is alert and oriented to person, place, and time.  Skin: Skin is warm and dry. No rash noted. There is erythema.  Stasis dermatitis both lower legs  Psychiatric: She has a normal mood and affect. Her behavior is normal. Thought content normal.  Nursing note and vitals reviewed.   BP 126/78 (BP Location: Right Arm, Patient Position: Sitting, Cuff Size: Normal)   Pulse 61   Ht _0  (1.803 m)   Wt 214 lb 3.2 oz (97.2 kg)   SpO2 98%   BMI 29.87 kg/m   Assessment and Plan: 1. Essential hypertension controlled - CBC with Differential/Platelet - TSH  2. Controlled type 2 diabetes mellitus with ulcer of lower extremity (HCC) Continue current therapy - Comprehensive  metabolic panel - Hemoglobin A1c - sitaGLIPtin-metformin (JANUMET) 50-500 MG tablet; Take 1 tablet by mouth 2 (two) times daily with a meal.  Dispense: 60 tablet; Refill: 5  3. Hyperlipidemia associated with type 2 diabetes mellitus ([place]) On statin therapy  4. Overweight (BMI 25.0-29.9) Stable, encourage exercise as able  5. Stasis dermatitis of both legs Continue compression stockings  6. Type II diabetes mellitus with peripheral circulatory disorder ([place])    Partially dictated using Editor, commissioning. Any errors are unintentional.  Halina Maidens, MD [facility]  12/29/2017

## 2017-12-30 LAB — COMPREHENSIVE METABOLIC PANEL
ALBUMIN: 4.3 g/dL (ref 3.5–4.8)
ALK PHOS: 77 IU/L (ref 39–117)
ALT: 23 IU/L (ref 0–32)
AST: 17 IU/L (ref 0–40)
Albumin/Globulin Ratio: 1.3 (ref 1.2–2.2)
BILIRUBIN TOTAL: 0.4 mg/dL (ref 0.0–1.2)
BUN / CREAT RATIO: 14 (ref 12–28)
BUN: 14 mg/dL (ref 8–27)
CHLORIDE: 101 mmol/L (ref 96–106)
CO2: 23 mmol/L (ref 20–29)
Calcium: 10.1 mg/dL (ref 8.7–10.3)
Creatinine, Ser: 0.99 mg/dL (ref 0.57–1.00)
GFR calc non Af Amer: 55 mL/min/{1.73_m2} — ABNORMAL LOW (ref >59)
GFR, EST AFRICAN AMERICAN: 64 mL/min/{1.73_m2} (ref >59)
GLOBULIN, TOTAL: 3.2 g/dL (ref 1.5–4.5)
Glucose: 125 mg/dL — ABNORMAL HIGH (ref 65–99)
Potassium: 4.4 mmol/L (ref 3.5–5.2)
SODIUM: 140 mmol/L (ref 134–144)
TOTAL PROTEIN: 7.5 g/dL (ref 6.0–8.5)

## 2017-12-30 LAB — CBC WITH DIFFERENTIAL/PLATELET
Basophils Absolute: 0.1 10*3/uL (ref 0.0–0.2)
Basos: 1 %
EOS (ABSOLUTE): 0.3 10*3/uL (ref 0.0–0.4)
Eos: 5 %
Hematocrit: 38.2 % (ref 34.0–46.6)
Hemoglobin: 12.7 g/dL (ref 11.1–15.9)
IMMATURE GRANULOCYTES: 0 %
Immature Grans (Abs): 0 10*3/uL (ref 0.0–0.1)
Lymphocytes Absolute: 3.2 10*3/uL — ABNORMAL HIGH (ref 0.7–3.1)
Lymphs: 46 %
MCH: 28.4 pg (ref 26.6–33.0)
MCHC: 33.2 g/dL (ref 31.5–35.7)
MCV: 86 fL (ref 79–97)
MONOS ABS: 0.4 10*3/uL (ref 0.1–0.9)
Monocytes: 6 %
NEUTROS PCT: 42 %
Neutrophils Absolute: 2.9 10*3/uL (ref 1.4–7.0)
Platelets: 210 10*3/uL (ref 150–450)
RBC: 4.47 x10E6/uL (ref 3.77–5.28)
RDW: 13.4 % (ref 12.3–15.4)
WBC: 6.8 10*3/uL (ref 3.4–10.8)

## 2017-12-30 LAB — HEMOGLOBIN A1C
Est. average glucose Bld gHb Est-mCnc: 171 mg/dL
Hgb A1c MFr Bld: 7.6 % — ABNORMAL HIGH (ref 4.8–5.6)

## 2017-12-30 LAB — TSH: TSH: 1.8 u[IU]/mL (ref 0.450–4.500)

## 2018-03-16 ENCOUNTER — Other Ambulatory Visit: Payer: Self-pay

## 2018-03-16 ENCOUNTER — Ambulatory Visit: Payer: Medicare HMO | Admitting: Internal Medicine

## 2018-03-16 DIAGNOSIS — E1151 Type 2 diabetes mellitus with diabetic peripheral angiopathy without gangrene: Secondary | ICD-10-CM

## 2018-04-05 ENCOUNTER — Other Ambulatory Visit: Payer: Self-pay | Admitting: Family Medicine

## 2018-04-05 ENCOUNTER — Other Ambulatory Visit: Payer: Self-pay | Admitting: Internal Medicine

## 2018-04-05 MED ORDER — PRAVASTATIN SODIUM 40 MG PO TABS: 40.0000 mg | ORAL_TABLET | Freq: Every day | ORAL | 0 refills | Status: DC

## 2018-04-05 MED ORDER — LISINOPRIL-HYDROCHLOROTHIAZIDE 10-12.5 MG PO TABS: 1.0000 | ORAL_TABLET | Freq: Every day | ORAL | 0 refills | Status: DC

## 2018-04-06 ENCOUNTER — Other Ambulatory Visit: Payer: Self-pay

## 2018-04-06 MED ORDER — LISINOPRIL-HYDROCHLOROTHIAZIDE 10-12.5 MG PO TABS: 1.0000 | ORAL_TABLET | Freq: Every day | ORAL | 0 refills | Status: DC

## 2018-04-06 MED ORDER — PRAVASTATIN SODIUM 40 MG PO TABS: 40.0000 mg | ORAL_TABLET | Freq: Every day | ORAL | 0 refills | Status: DC

## 2018-04-27 ENCOUNTER — Encounter: Payer: Self-pay | Admitting: Internal Medicine

## 2018-04-27 ENCOUNTER — Other Ambulatory Visit: Payer: Self-pay

## 2018-04-27 ENCOUNTER — Ambulatory Visit: Payer: Medicare HMO | Admitting: Family Medicine

## 2018-04-27 ENCOUNTER — Ambulatory Visit (INDEPENDENT_AMBULATORY_CARE_PROVIDER_SITE_OTHER): Payer: Medicare HMO | Admitting: Internal Medicine

## 2018-04-27 VITALS — BP 128/78 | HR 65 | Ht 71.0 in | Wt 210.0 lb

## 2018-04-27 DIAGNOSIS — I872 Venous insufficiency (chronic) (peripheral): Secondary | ICD-10-CM

## 2018-04-27 DIAGNOSIS — I10 Essential (primary) hypertension: Secondary | ICD-10-CM | POA: Diagnosis not present

## 2018-04-27 DIAGNOSIS — E1169 Type 2 diabetes mellitus with other specified complication: Secondary | ICD-10-CM

## 2018-04-27 DIAGNOSIS — R413 Other amnesia: Secondary | ICD-10-CM | POA: Diagnosis not present

## 2018-04-27 DIAGNOSIS — E1151 Type 2 diabetes mellitus with diabetic peripheral angiopathy without gangrene: Secondary | ICD-10-CM

## 2018-04-27 DIAGNOSIS — E785 Hyperlipidemia, unspecified: Secondary | ICD-10-CM | POA: Diagnosis not present

## 2018-04-27 MED ORDER — BLOOD GLUCOSE METER KIT: PACK | 0 refills | Status: DC

## 2018-04-27 NOTE — Progress Notes (Signed)
Date:  04/27/2018   Name:  [name]   DOB:  [birth year]   MRN:  [id number]   Chief Complaint: Diabetes (Follow up. Patient needs a new glucose moniter kit. She said hers is old and she doesn't think it works properly. )  Diabetes  She presents for her follow-up diabetic visit. She has type 2 diabetes mellitus. Her disease course has been stable. Hypoglycemia symptoms include confusion. Pertinent negatives for hypoglycemia include no dizziness or headaches. Pertinent negatives for diabetes include no chest pain, no fatigue, no foot paresthesias, no polyuria, no visual change and no weakness. Symptoms are stable. Current diabetic treatment includes oral agent (dual therapy). She is compliant with treatment all of the time. Her weight is stable. She is following a generally healthy diet. She participates in exercise intermittently. An ACE inhibitor/angiotensin II receptor blocker is being taken.  Hypertension  This is a chronic problem. The problem is controlled. Pertinent negatives include no chest pain, headaches, palpitations or shortness of breath. Past treatments include ACE inhibitors and diuretics.  Memory issues - pt has a history of memory issues noted for several years.  No apparent workup has been done.  Last 6CIT = 15 last year.  Pt admits to memory issues but is not concerned.  She does not drive.  She lives with her husband. He has said that her memory is bad and that she is getting worse.  Lab Results  Component Value Date   HGBA1C 7.6 (H) 12/29/2017   Lab Results  Component Value Date   CREATININE 0.99 12/29/2017   BUN 14 12/29/2017   NA 140 12/29/2017   K 4.4 12/29/2017   CL 101 12/29/2017   CO2 23 12/29/2017     Review of Systems  Constitutional: Negative for chills, fatigue and unexpected weight change.  Respiratory: Negative for cough, chest tightness and shortness of breath.   Cardiovascular: Negative for chest pain, palpitations and leg swelling.   Gastrointestinal: Negative for abdominal pain.  Endocrine: Negative for polyuria.  Skin: Positive for color change. Negative for rash.  Allergic/Immunologic: Negative for environmental allergies.  Neurological: Negative for dizziness, weakness and headaches.  Hematological: Negative for adenopathy.  Psychiatric/Behavioral: Positive for confusion. Negative for sleep disturbance.    Patient Active Problem List   Diagnosis Date Noted  . 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 Hypertension 08/18/2016  . Microalbuminuria 05/24/2016  . Type II diabetes mellitus with peripheral circulatory disorder ([place]) 05/23/2016  . Hyperlipidemia associated with type 2 diabetes mellitus ([place]) 05/23/2016  . Vitamin D deficiency 05/23/2016  . B12 deficiency 05/23/2016  . Short-term memory loss 05/23/2016  . Overweight (BMI 25.0-29.9) 05/23/2016  . Edema of right lower extremity 05/23/2016    No Known Allergies  History reviewed. No pertinent surgical history.  Social History   Tobacco Use  . Smoking status: Former Smoker    Packs/day: 0.25    Years: 50.00    Pack years: 12.50    Types: Cigarettes    Last attempt to quit: 2009    Years since quitting: 11.2  . Smokeless tobacco: Never Used  . Tobacco comment: smoking cessation materials not required  Substance Use Topics  . Alcohol use: Not Currently  . Drug use: Never     Medication list has been reviewed and updated.  Current Meds  Medication Sig  . blood glucose meter kit and supplies KIT Dispense based on patient and insurance preference.ICD 10- E11.65 Diabetes Mellitus 2 Uncontrolled.Directions: Use to check Blood Sugar up to  four times daily.  . Cholecalciferol (VITAMIN D) 2000 units CAPS Take 1 capsule (2,000 Units total) by mouth daily.  Marland Kitchen glucose blood test strip Use per insurance brand. Diagnosis:Diabetes Mellitus 2 Uncontrolled without Complications = G81. 65. Check Blood Sugar up to four times daily.  . Lancets MISC Use per  insurance brand. To use with Glucometer to monitor sugar up to four times daily.  Marland Kitchen lisinopril-hydrochlorothiazide (PRINZIDE,ZESTORETIC) 10-12.5 MG tablet Take 1 tablet by mouth daily.  . Multiple Vitamin (MULTIVITAMIN) capsule Take 1 capsule by mouth daily.  . pravastatin (PRAVACHOL) 40 MG tablet Take 1 tablet (40 mg total) by mouth daily.  . sitaGLIPtin-metformin (JANUMET) 50-500 MG tablet Take 1 tablet by mouth 2 (two) times daily with a meal.    PHQ 2/9 Scores 04/27/2018 12/29/2017 07/12/2017 05/23/2016  PHQ - 2 Score 0 0 0 0  PHQ- 9 Score - - 0 -    Physical Exam Vitals signs and nursing note reviewed.  Constitutional:      General: She is not in acute distress.    Appearance: She is well-developed.  HENT:     Head: Normocephalic and atraumatic.  Eyes:     Pupils: Pupils are equal, round, and reactive to light.  Neck:     Musculoskeletal: Normal range of motion and neck supple.  Cardiovascular:     Rate and Rhythm: Normal rate and regular rhythm.     Pulses:          Dorsalis pedis pulses are 0 on the right side and 0 on the left side.       Posterior tibial pulses are 0 on the right side and 0 on the left side.     Comments: No pulses in LE palpable Pulmonary:     Effort: Pulmonary effort is normal. No respiratory distress.     Breath sounds: Normal breath sounds.  Musculoskeletal: Normal range of motion.        General: No swelling.     Right lower leg: No edema.     Left lower leg: No edema.  Skin:    General: Skin is warm.     Comments: Chronic skin changes of both lower legs - skiny tight skin with scaly thickened plaques over anterior shins  Neurological:     Mental Status: She is alert and oriented to person, place, and time.  Psychiatric:        Attention and Perception: Attention normal.        Mood and Affect: Mood normal.        Behavior: Behavior normal.        Thought Content: Thought content normal.        Cognition and Memory: Memory is impaired.      Wt Readings from Last 3 Encounters:  04/27/18 210 lb (95.3 kg)  12/29/17 214 lb 3.2 oz (97.2 kg)  07/12/17 205 lb (93 kg)    BP 128/78   Pulse 65   Ht '5\' 11"'  (1.803 m)   Wt 210 lb (95.3 kg)   SpO2 98%   BMI 29.29 kg/m   Assessment and Plan: 1. Type II diabetes mellitus with peripheral circulatory disorder (HCC) Continue current therapy Resume check BS with new meter - Hemoglobin A1c - blood glucose meter kit and supplies; Dispense based on patient and insurance preference. Use up to four times daily as directed. (FOR ICD-10 E10.9, E11.9).  Dispense: 1 each; Refill: 0 - Ambulatory referral to Ophthalmology - Comprehensive metabolic panel  2.  Hyperlipidemia associated with type 2 diabetes mellitus ([place]) On statin therapy  3. Essential hypertension controlled  4. Short-term memory loss Needs Neurology evaluation - Ambulatory referral to Neurology  5. Peripheral vascular disease with stasis dermatitis Chronic changes without ulcer or infection symptoms at this time   Partially dictated using Editor, commissioning. Any errors are unintentional.  Halina Maidens, MD [facility]  04/27/2018

## 2018-04-28 LAB — COMPREHENSIVE METABOLIC PANEL
ALT: 20 IU/L (ref 0–32)
AST: 22 IU/L (ref 0–40)
Albumin/Globulin Ratio: 1.6 (ref 1.2–2.2)
Albumin: 4.5 g/dL (ref 3.7–4.7)
Alkaline Phosphatase: 78 IU/L (ref 39–117)
BUN / CREAT RATIO: 15 (ref 12–28)
BUN: 15 mg/dL (ref 8–27)
Bilirubin Total: 0.4 mg/dL (ref 0.0–1.2)
CALCIUM: 10.7 mg/dL — AB (ref 8.7–10.3)
CO2: 24 mmol/L (ref 20–29)
Chloride: 104 mmol/L (ref 96–106)
Creatinine, Ser: 0.97 mg/dL (ref 0.57–1.00)
GFR calc Af Amer: 65 mL/min/{1.73_m2} (ref >59)
GFR, EST NON AFRICAN AMERICAN: 57 mL/min/{1.73_m2} — AB (ref >59)
GLOBULIN, TOTAL: 2.8 g/dL (ref 1.5–4.5)
Glucose: 104 mg/dL — ABNORMAL HIGH (ref 65–99)
Potassium: 4.7 mmol/L (ref 3.5–5.2)
Sodium: 143 mmol/L (ref 134–144)
Total Protein: 7.3 g/dL (ref 6.0–8.5)

## 2018-04-28 LAB — HEMOGLOBIN A1C
Est. average glucose Bld gHb Est-mCnc: 163 mg/dL
Hgb A1c MFr Bld: 7.3 % — ABNORMAL HIGH (ref 4.8–5.6)

## 2018-07-16 ENCOUNTER — Ambulatory Visit (INDEPENDENT_AMBULATORY_CARE_PROVIDER_SITE_OTHER): Payer: Medicare HMO

## 2018-07-16 ENCOUNTER — Other Ambulatory Visit: Payer: Self-pay

## 2018-07-16 VITALS — BP 144/88 | HR 61 | Temp 97.8°F | Resp 16 | Ht 71.0 in | Wt 208.8 lb

## 2018-07-16 DIAGNOSIS — Z0000 Encounter for general adult medical examination without abnormal findings: Secondary | ICD-10-CM | POA: Diagnosis not present

## 2018-07-16 DIAGNOSIS — Z135 Encounter for screening for eye and ear disorders: Secondary | ICD-10-CM

## 2018-07-16 DIAGNOSIS — E1151 Type 2 diabetes mellitus with diabetic peripheral angiopathy without gangrene: Secondary | ICD-10-CM | POA: Diagnosis not present

## 2018-07-16 NOTE — Patient Instructions (Signed)
[name] , Thank you for taking time to come for your Medicare Wellness Visit. I appreciate your ongoing commitment to your health goals. Please review the following plan we discussed and let me know if I can assist you in the future.   Screening recommendations/referrals: Colonoscopy: postponed Mammogram: postponed Bone Density: postponed Recommended yearly ophthalmology/optometry visit for glaucoma screening and checkup Recommended yearly dental visit for hygiene and checkup  Vaccinations: Influenza vaccine: postponed Pneumococcal vaccine: postponed Tdap vaccine: postponed Shingles vaccine: declined    Advanced directives: Advance directive discussed with you today. Even though you declined this today please call our office should you change your mind and we can give you the proper paperwork for you to fill out.  Conditions/risks identified: recommend increasing physical activity to at least 90 minutes per week.  Next appointment: Please follow up in one year for your Medicare Annual Wellness visit.     Preventive Care 8 Years and Older, Female Preventive care refers to lifestyle choices and visits with your health care provider that can promote health and wellness. What does preventive care include?  A yearly physical exam. This is also called an annual well check.  Dental exams once or twice a year.  Routine eye exams. Ask your health care provider how often you should have your eyes checked.  Personal lifestyle choices, including:  Daily care of your teeth and gums.  Regular physical activity.  Eating a healthy diet.  Avoiding tobacco and drug use.  Limiting alcohol use.  Practicing safe sex.  Taking low-dose aspirin every day.  Taking vitamin and mineral supplements as recommended by your health care provider. What happens during an annual well check? The services and screenings done by your health care provider during your annual well check will depend  on your age, overall health, lifestyle risk factors, and family history of disease. Counseling  Your health care provider may ask you questions about your:  Alcohol use.  Tobacco use.  Drug use.  Emotional well-being.  Home and relationship well-being.  Sexual activity.  Eating habits.  History of falls.  Memory and ability to understand (cognition).  Work and work Statistician.  Reproductive health. Screening  You may have the following tests or measurements:  Height, weight, and BMI.  Blood pressure.  Lipid and cholesterol levels. These may be checked every 5 years, or more frequently if you are over 49 years old.  Skin check.  Lung cancer screening. You may have this screening every year starting at age 27 if you have a 30-pack-year history of smoking and currently smoke or have quit within the past 15 years.  Fecal occult blood test (FOBT) of the stool. You may have this test every year starting at age 41.  Flexible sigmoidoscopy or colonoscopy. You may have a sigmoidoscopy every 5 years or a colonoscopy every 10 years starting at age 35.  Hepatitis C blood test.  Hepatitis B blood test.  Sexually transmitted disease (STD) testing.  Diabetes screening. This is done by checking your blood sugar (glucose) after you have not eaten for a while (fasting). You may have this done every 1-3 years.  Bone density scan. This is done to screen for osteoporosis. You may have this done starting at age 57.  Mammogram. This may be done every 1-2 years. Talk to your health care provider about how often you should have regular mammograms. Talk with your health care provider about your test results, treatment options, and if necessary, the need for more  tests. Vaccines  Your health care provider may recommend certain vaccines, such as:  Influenza vaccine. This is recommended every year.  Tetanus, diphtheria, and acellular pertussis (Tdap, Td) vaccine. You may need a Td  booster every 10 years.  Zoster vaccine. You may need this after age 65.  Pneumococcal 13-valent conjugate (PCV13) vaccine. One dose is recommended after age 47.  Pneumococcal polysaccharide (PPSV23) vaccine. One dose is recommended after age 72. Talk to your health care provider about which screenings and vaccines you need and how often you need them. This information is not intended to replace advice given to you by your health care provider. Make sure you discuss any questions you have with your health care provider. Document Released: 02/20/2015 Document Revised: 10/14/2015 Document Reviewed: 11/25/2014 Elsevier Interactive Patient Education  2017 [place] Prevention in the Home Falls can cause injuries. They can happen to people of all ages. There are many things you can do to make your home safe and to help prevent falls. What can I do on the outside of my home?  Regularly fix the edges of walkways and driveways and fix any cracks.  Remove anything that might make you trip as you walk through a door, such as a raised step or threshold.  Trim any bushes or trees on the path to your home.  Use bright outdoor lighting.  Clear any walking paths of anything that might make someone trip, such as rocks or tools.  Regularly check to see if handrails are loose or broken. Make sure that both sides of any steps have handrails.  Any raised decks and porches should have guardrails on the edges.  Have any leaves, snow, or ice cleared regularly.  Use sand or salt on walking paths during winter.  Clean up any spills in your garage right away. This includes oil or grease spills. What can I do in the bathroom?  Use night lights.  Install grab bars by the toilet and in the tub and shower. Do not use towel bars as grab bars.  Use non-skid mats or decals in the tub or shower.  If you need to sit down in the shower, use a plastic, non-slip stool.  Keep the floor dry. Clean up  any water that spills on the floor as soon as it happens.  Remove soap buildup in the tub or shower regularly.  Attach bath mats securely with double-sided non-slip rug tape.  Do not have throw rugs and other things on the floor that can make you trip. What can I do in the bedroom?  Use night lights.  Make sure that you have a light by your bed that is easy to reach.  Do not use any sheets or blankets that are too big for your bed. They should not hang down onto the floor.  Have a firm chair that has side arms. You can use this for support while you get dressed.  Do not have throw rugs and other things on the floor that can make you trip. What can I do in the kitchen?  Clean up any spills right away.  Avoid walking on wet floors.  Keep items that you use a lot in easy-to-reach places.  If you need to reach something above you, use a strong step stool that has a grab bar.  Keep electrical cords out of the way.  Do not use floor polish or wax that makes floors slippery. If you must use wax, use non-skid floor  wax.  Do not have throw rugs and other things on the floor that can make you trip. What can I do with my stairs?  Do not leave any items on the stairs.  Make sure that there are handrails on both sides of the stairs and use them. Fix handrails that are broken or loose. Make sure that handrails are as long as the stairways.  Check any carpeting to make sure that it is firmly attached to the stairs. Fix any carpet that is loose or worn.  Avoid having throw rugs at the top or bottom of the stairs. If you do have throw rugs, attach them to the floor with carpet tape.  Make sure that you have a light switch at the top of the stairs and the bottom of the stairs. If you do not have them, ask someone to add them for you. What else can I do to help prevent falls?  Wear shoes that:  Do not have high heels.  Have rubber bottoms.  Are comfortable and fit you well.  Are  closed at the toe. Do not wear sandals.  If you use a stepladder:  Make sure that it is fully opened. Do not climb a closed stepladder.  Make sure that both sides of the stepladder are locked into place.  Ask someone to hold it for you, if possible.  Clearly mark and make sure that you can see:  Any grab bars or handrails.  First and last steps.  Where the edge of each step is.  Use tools that help you move around (mobility aids) if they are needed. These include:  Canes.  Walkers.  Scooters.  Crutches.  Turn on the lights when you go into a dark area. Replace any light bulbs as soon as they burn out.  Set up your furniture so you have a clear path. Avoid moving your furniture around.  If any of your floors are uneven, fix them.  If there are any pets around you, be aware of where they are.  Review your medicines with your doctor. Some medicines can make you feel dizzy. This can increase your chance of falling. Ask your doctor what other things that you can do to help prevent falls. This information is not intended to replace advice given to you by your health care provider. Make sure you discuss any questions you have with your health care provider. Document Released: 11/20/2008 Document Revised: 07/02/2015 Document Reviewed: 02/28/2014 Elsevier Interactive Patient Education  2017 [employer].

## 2018-07-16 NOTE — Progress Notes (Addendum)
Subjective:   [name] is a 78 y.o. female who presents for Medicare Annual (Subsequent) preventive examination.  Review of Systems:   Cardiac Risk Factors include: advanced age (>51mn, >>52women);diabetes mellitus;dyslipidemia;hypertension     Objective:     Vitals: BP (!) 144/88 (BP Location: Right Arm, Cuff Size: Large)    Pulse 61    Temp 97.8 F (36.6 C) (Oral)    Resp 16    Ht _0  (1.803 m)    Wt 208 lb 12.8 oz (94.7 kg)    SpO2 99%    BMI 29.12 kg/m   Body mass index is 29.12 kg/m.  Advanced Directives 07/16/2018 07/12/2017 08/18/2016 06/17/2016 05/23/2016  Does Patient Have a Medical Advance Directive? _1   Would patient like information on creating a medical advance directive? No - Patient declined Yes (MAU/Ambulatory/Procedural Areas - Information given) - - -    Tobacco Social History   Tobacco Use  Smoking Status Former Smoker   Packs/day: 0.25   Years: 50.00   Pack years: 12.50   Types: Cigarettes   Last attempt to quit: 2009   Years since quitting: 11.4  Smokeless Tobacco Never Used  Tobacco Comment   smoking cessation materials not required     Counseling given: Not Answered Comment: smoking cessation materials not required   Clinical Intake:  Pre-visit preparation completed: Yes  Pain : No/denies pain     BMI - recorded: 29.12 Nutritional Status: BMI 25 -29 Overweight Nutritional Risks: None Diabetes: Yes CBG done?: No Did pt. bring in CBG monitor from home?: No   Nutrition Risk Assessment:  Has the patient had any N/V/D within the last 2 months?  No  Does the patient have any non-healing wounds?  No  Has the patient had any unintentional weight loss or weight gain?  No   Diabetes:  Is the patient diabetic?  Yes  If diabetic, was a CBG obtained today?  No  Did the patient bring in their glucometer from home?  No  How often do you monitor your CBG's? Daily fasting am. Today 130.   Financial Strains and  Diabetes Management:  Are you having any financial strains with the device, your supplies or your medication? No .  Does the patient want to be seen by Chronic Care Management for management of their diabetes?  No  Would the patient like to be referred to a Nutritionist or for Diabetic Management?  No   Diabetic Exams:  Diabetic Eye Exam: Overdue for diabetic eye exam. Pt has been advised about the importance in completing this exam. A second referral has been placed today. First referral placed 04/27/18 [facility] stated they were only seeing emergencies due to Covid-19.  Advised pt to expect a call from regarding appt.  Diabetic Foot Exam: Completed 04/27/18.   How often do you need to have someone help you when you read instructions, pamphlets, or other written materials from your doctor or pharmacy?: 1 - Never  Interpreter Needed?: No  Information entered by :: KClemetine MarkerLPN  Past Medical History:  Diagnosis Date   Cellulitis of left foot 05/23/2016   Diabetes mellitus without complication ([place]    Diabetic ulcer of left foot ([facility] 05/23/2016   Hyperlipidemia    Hypertension    Peripheral vascular disease ([facility]    Vitamin D deficiency 05/23/2016   History reviewed. No pertinent surgical history. Family History  Problem Relation Age of Onset   Healthy Mother  Healthy Father    Social History   Socioeconomic History   Marital status: Married    Spouse name: Arnell Sieving   Number of children: 2   Years of education: Not on file   Highest education level: 12th grade  Occupational History   Occupation: Retired  Scientist, product/process development strain: Not hard at [employer] insecurity:    Worry: Never true    Inability: Never true   Transportation needs:    Medical: No    Non-medical: No  Tobacco Use   Smoking status: Former Smoker    Packs/day: 0.25    Years: 50.00    Pack years: 12.50    Types: Cigarettes    Last attempt to quit: 2009    Years  since quitting: 11.4   Smokeless tobacco: Never Used   Tobacco comment: smoking cessation materials not required  Substance and Sexual Activity   Alcohol use: Not Currently   Drug use: Never   Sexual activity: Not Currently  Lifestyle   Physical activity:    Days per week: 0 days    Minutes per session: 0 min   Stress: Not at all  Relationships   Social connections:    Talks on phone: More than three times a week    Gets together: Three times a week    Attends religious service: More than 4 times per year    Active member of club or organization: No    Attends meetings of clubs or organizations: Never    Relationship status: Married  Other Topics Concern   Not on file  Social History Narrative   Not on file    Outpatient Encounter Medications as of 07/16/2018  Medication Sig   blood glucose meter kit and supplies KIT Dispense based on patient and insurance preference.ICD 10- E11.65 Diabetes Mellitus 2 Uncontrolled.Directions: Use to check Blood Sugar up to four times daily.   glucose blood test strip Use per insurance brand. Diagnosis:Diabetes Mellitus 2 Uncontrolled without Complications = G99. 65. Check Blood Sugar up to four times daily.   Lancets MISC Use per insurance brand. To use with Glucometer to monitor sugar up to four times daily.   lisinopril-hydrochlorothiazide (PRINZIDE,ZESTORETIC) 10-12.5 MG tablet Take 1 tablet by mouth daily.   Multiple Vitamin (MULTIVITAMIN) capsule Take 1 capsule by mouth daily.   pravastatin (PRAVACHOL) 40 MG tablet Take 1 tablet (40 mg total) by mouth daily.   sitaGLIPtin-metformin (JANUMET) 50-500 MG tablet Take 1 tablet by mouth 2 (two) times daily with a meal.   Vitamin D, Cholecalciferol, 25 MCG (1000 UT) CAPS Take 1 capsule by mouth daily.   [DISCONTINUED] blood glucose meter kit and supplies Dispense based on patient and insurance preference. Use up to four times daily as directed. (FOR ICD-10 E10.9, E11.9).    [DISCONTINUED] Cholecalciferol (VITAMIN D) 2000 units CAPS Take 1 capsule (2,000 Units total) by mouth daily.   No facility-administered encounter medications on file as of 07/16/2018.     Activities of Daily Living In your present state of health, do you have any difficulty performing the following activities: 07/16/2018  Hearing? N  Comment declines hearing aids  Vision? N  Comment wears glasses  Difficulty concentrating or making decisions? Y  Comment trouble with memory/recall  Walking or climbing stairs? N  Dressing or bathing? N  Doing errands, shopping? N  Preparing Food and eating ? N  Using the Toilet? N  In the past six months, have you accidently leaked  urine? N  Do you have problems with loss of bowel control? N  Managing your Medications? N  Managing your Finances? N  Housekeeping or managing your Housekeeping? N  Some recent data might be hidden    Patient Care Team: Glean Hess, MD as PCP - General (Internal Medicine)    Assessment:   This is a routine wellness examination for Zori.  Exercise Activities and Dietary recommendations Current Exercise Habits: The patient does not participate in regular exercise at present, Exercise limited by: None identified  Goals     DIET - INCREASE WATER INTAKE     Recommend to drink at least 6-8 8oz glasses of water per day.       Fall Risk Fall Risk  07/16/2018 04/27/2018 12/29/2017 07/12/2017 05/23/2016  Falls in the past year? 0 0 0 No No  Number falls in past yr: 0 0 0 - -  Injury with Fall? 0 0 0 - -  Risk for fall due to : - Impaired balance/gait History of fall(s) Impaired vision -  Risk for fall due to: Comment - - - wears eyeglasses -  Follow up Falls prevention discussed Falls evaluation completed - - -   FALL RISK PREVENTION PERTAINING TO THE HOME:  Any stairs in or around the home? Yes  If so, do they handrails? No  outside porch 3 steps  Home free of loose throw rugs in walkways, pet beds,  electrical cords, etc? Yes  Adequate lighting in your home to reduce risk of falls? Yes   ASSISTIVE DEVICES UTILIZED TO PREVENT FALLS:  Life alert? No  Use of a cane, walker or w/c? No  Grab bars in the bathroom? Yes  Shower chair or bench in shower? Yes  Elevated toilet seat or a handicapped toilet? No   DME ORDERS:  DME order needed?  No   TIMED UP AND GO:  Was the test performed? Yes .  Length of time to ambulate 10 feet: 5 sec.   GAIT:  Appearance of gait: Gait stead-fast and without the use of an assistive device.   Education: Fall risk prevention has been discussed.  Intervention(s) required? No   Depression Screen PHQ 2/9 Scores 07/16/2018 04/27/2018 12/29/2017 07/12/2017  PHQ - 2 Score 0 0 0 0  PHQ- 9 Score - - - 0     Cognitive Function     6CIT Screen 07/16/2018 07/12/2017  What Year? 0 points 0 points  What month? 3 points 3 points  What time? 0 points 0 points  Count back from 20 0 points 0 points  Months in reverse 0 points 2 points  Repeat phrase 4 points 10 points  Total Score 7 15     There is no immunization history on file for this patient.  Qualifies for Shingles Vaccine? Yes . Due for Shingrix. Education has been provided regarding the importance of this vaccine. Pt has been advised to call insurance company to determine out of pocket expense. Advised may also receive vaccine at local pharmacy or Health Dept. Verbalized acceptance and understanding.  Tdap: Although this vaccine is not a covered service during a Wellness Exam, does the patient still wish to receive this vaccine today?  No .  Education has been provided regarding the importance of this vaccine. Advised may receive this vaccine at local pharmacy or Health Dept. Aware to provide a copy of the vaccination record if obtained from local pharmacy or Health Dept. Verbalized acceptance and understanding.  Flu Vaccine:  Due for Flu vaccine. Does the patient want to receive this vaccine today?  No  . Education has been provided regarding the importance of this vaccine but still declined. Advised may receive this vaccine at local pharmacy or Health Dept. Aware to provide a copy of the vaccination record if obtained from local pharmacy or Health Dept. Verbalized acceptance and understanding.  Pneumococcal Vaccine: Due for Pneumococcal vaccine. Does the patient want to receive this vaccine today?  No . Education has been provided regarding the importance of this vaccine but still declined. Advised may receive this vaccine at local pharmacy or Health Dept. Aware to provide a copy of the vaccination record if obtained from local pharmacy or Health Dept. Verbalized acceptance and understanding.   Screening Tests Health Maintenance  Topic Date Due   TETANUS/TDAP  10/31/1959   DEXA SCAN  10/30/2005   PNA vac Low Risk Adult (1 of 2 - PCV13) 10/30/2005   OPHTHALMOLOGY EXAM  04/07/2017   INFLUENZA VACCINE  09/08/2018   HEMOGLOBIN A1C  10/28/2018   FOOT EXAM  04/27/2019    Cancer Screenings:  Colorectal Screening: Not completed. Pt declines hthis screening.  Mammogram: completed years ago per patient. Declines this screening, states completes self breast exams at least once a year.    Bone Density: Not Completed. Pt declines this screening.   Lung Cancer Screening: (Low Dose CT Chest recommended if Age 85-80 years, 30 pack-year currently smoking OR have quit w/in 15years.) does not qualify.   Additional Screening:  Hepatitis C Screening: no longer required.   Vision Screening: Recommended annual ophthalmology exams for early detection of glaucoma and other disorders of the eye. Is the patient up to date with their annual eye exam?  No  Who is the provider or what is the name of the office in which the pt attends annual eye exams? Not established If pt is not established with a provider, would they like to be referred to a provider to establish care? Yes . Ophthalmology referral has  been placed. Pt aware the office will call re: appt.  Dental Screening: Recommended annual dental exams for proper oral hygiene  Community Resource Referral:  CRR required this visit?  No      Plan:    I have personally reviewed and addressed the Medicare Annual Wellness questionnaire and have noted the following in the patients chart:  A. Medical and social history B. Use of alcohol, tobacco or illicit drugs  C. Current medications and supplements D. Functional ability and status E.  Nutritional status F.  Physical activity G. Advance directives H. List of other physicians I.  Hospitalizations, surgeries, and ER visits in previous 12 months J.  Independence such as hearing and vision if needed, cognitive and depression L. Referrals and appointments   In addition, I have reviewed and discussed with patient certain preventive protocols, quality metrics, and best practice recommendations. A written personalized care plan for preventive services as well as general preventive health recommendations were provided to patient.   Signed,  Clemetine Marker, LPN Nurse Health Advisor   Nurse Notes:pt c/o trouble with memory and being unable to recall things at the moment she is asked. She was referred to Dr. Melrose Nakayama for worsening short term memory and has upcoming appointment but does not remember when it is. Referral also placed again to [facility] due to last referral closed for emergency only visits due pandemic.  BP slightly elevated today. 1st 150/90 2nd 144/88, pt states  she has not taken her blood pressure medication yet today and has f/u scheduled with Dr. Army Melia next month.

## 2018-07-17 DIAGNOSIS — R413 Other amnesia: Secondary | ICD-10-CM | POA: Diagnosis not present

## 2018-08-17 ENCOUNTER — Ambulatory Visit (INDEPENDENT_AMBULATORY_CARE_PROVIDER_SITE_OTHER): Payer: Medicare HMO | Admitting: Internal Medicine

## 2018-08-17 ENCOUNTER — Other Ambulatory Visit: Payer: Self-pay

## 2018-08-17 ENCOUNTER — Encounter: Payer: Self-pay | Admitting: Internal Medicine

## 2018-08-17 VITALS — BP 132/74 | HR 76 | Temp 97.3°F | Ht 71.0 in | Wt 210.0 lb

## 2018-08-17 DIAGNOSIS — E1169 Type 2 diabetes mellitus with other specified complication: Secondary | ICD-10-CM | POA: Diagnosis not present

## 2018-08-17 DIAGNOSIS — Z0000 Encounter for general adult medical examination without abnormal findings: Secondary | ICD-10-CM | POA: Diagnosis not present

## 2018-08-17 DIAGNOSIS — I10 Essential (primary) hypertension: Secondary | ICD-10-CM | POA: Diagnosis not present

## 2018-08-17 DIAGNOSIS — I872 Venous insufficiency (chronic) (peripheral): Secondary | ICD-10-CM | POA: Diagnosis not present

## 2018-08-17 DIAGNOSIS — E785 Hyperlipidemia, unspecified: Secondary | ICD-10-CM

## 2018-08-17 DIAGNOSIS — E11622 Type 2 diabetes mellitus with other skin ulcer: Secondary | ICD-10-CM

## 2018-08-17 DIAGNOSIS — L97909 Non-pressure chronic ulcer of unspecified part of unspecified lower leg with unspecified severity: Secondary | ICD-10-CM

## 2018-08-17 DIAGNOSIS — Z1231 Encounter for screening mammogram for malignant neoplasm of breast: Secondary | ICD-10-CM | POA: Diagnosis not present

## 2018-08-17 DIAGNOSIS — E118 Type 2 diabetes mellitus with unspecified complications: Secondary | ICD-10-CM

## 2018-08-17 LAB — POCT URINALYSIS DIPSTICK
Bilirubin, UA: NEGATIVE
Blood, UA: NEGATIVE
Glucose, UA: NEGATIVE
Ketones, UA: NEGATIVE
Leukocytes, UA: NEGATIVE
Nitrite, UA: NEGATIVE
Protein, UA: NEGATIVE
Spec Grav, UA: 1.025 (ref 1.010–1.025)
Urobilinogen, UA: 0.2 E.U./dL
pH, UA: 6 (ref 5.0–8.0)

## 2018-08-17 MED ORDER — LISINOPRIL-HYDROCHLOROTHIAZIDE 10-12.5 MG PO TABS: 1.0000 | ORAL_TABLET | Freq: Every day | ORAL | 3 refills | Status: DC

## 2018-08-17 MED ORDER — PRAVASTATIN SODIUM 40 MG PO TABS: 40.0000 mg | ORAL_TABLET | Freq: Every day | ORAL | 3 refills | Status: DC

## 2018-08-17 MED ORDER — JANUMET 50-500 MG PO TABS: 1.0000 | ORAL_TABLET | Freq: Two times a day (BID) | ORAL | 3 refills | Status: DC

## 2018-08-17 NOTE — Progress Notes (Signed)
Date:  08/17/2018   Name:  [name]   DOB:  Feb 19, 1940   MRN:  [id number]   Chief Complaint: Annual Exam (Breast Exam.) [name] is a 78 y.o. female who presents today for her Complete Annual Exam. She feels fairly well. She reports exercising only doing housework - nothing structured. She reports she is sleeping fairly well.   Colonoscopy - declines Mammogram - aged out Immunizations - incomplete; patient declines  Diabetes She presents for her follow-up diabetic visit. She has type 2 diabetes mellitus. Her disease course has been stable. Pertinent negatives for hypoglycemia include no dizziness, headaches, nervousness/anxiousness or tremors. Pertinent negatives for diabetes include no chest pain, no fatigue, no polydipsia and no polyuria. Current diabetic treatment includes oral agent (dual therapy). She is compliant with treatment all of the time. Her weight is stable. She monitors blood glucose at home 1-2 x per day. There is no change in her home blood glucose trend. Her breakfast blood glucose is taken between 7-8 am. Her breakfast blood glucose range is generally 110-130 mg/dl. An ACE inhibitor/angiotensin II receptor blocker is being taken.  Hyperlipidemia This is a chronic problem. Pertinent negatives include no chest pain or shortness of breath. Current antihyperlipidemic treatment includes statins. The current treatment provides significant improvement of lipids.  Hypertension This is a chronic problem. Pertinent negatives include no chest pain, headaches, palpitations or shortness of breath. Past treatments include ACE inhibitors and diuretics. The current treatment provides significant improvement.   Lab Results  Component Value Date   HGBA1C 7.3 (H) 04/27/2018   Lab Results  Component Value Date   CREATININE 0.97 04/27/2018   BUN 15 04/27/2018   NA 143 04/27/2018   K 4.7 04/27/2018   CL 104 04/27/2018   CO2 24 04/27/2018   Lab Results  Component  Value Date   CHOL 154 05/01/2017   HDL 50 05/01/2017   LDLCALC 85 05/01/2017   TRIG 96 05/01/2017   CHOLHDL 3.1 05/01/2017    Review of Systems  Constitutional: Negative for chills, fatigue and fever.  HENT: Negative for congestion, hearing loss, tinnitus, trouble swallowing and voice change.   Eyes: Negative for visual disturbance.  Respiratory: Negative for cough, chest tightness, shortness of breath and wheezing.   Cardiovascular: Negative for chest pain, palpitations and leg swelling.  Gastrointestinal: Negative for abdominal pain, constipation, diarrhea and vomiting.  Endocrine: Negative for polydipsia and polyuria.  Genitourinary: Negative for dysuria, frequency, genital sores, vaginal bleeding and vaginal discharge.  Musculoskeletal: Negative for arthralgias, gait problem and joint swelling.  Skin: Negative for color change and rash.  Neurological: Negative for dizziness, tremors, light-headedness and headaches.  Hematological: Negative for adenopathy. Does not bruise/bleed easily.  Psychiatric/Behavioral: Negative for dysphoric mood and sleep disturbance. The patient is not nervous/anxious.     Patient Active Problem List   Diagnosis Date Noted  . Peripheral vascular disease with stasis dermatitis 04/27/2018  . 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 Hypertension 08/18/2016  . Microalbuminuria 05/24/2016  . Type II diabetes mellitus with complication ([place]) 06/00/4599  . Hyperlipidemia associated with type 2 diabetes mellitus ([place]) 05/23/2016  . B12 deficiency 05/23/2016  . Short-term memory loss 05/23/2016  . Overweight (BMI 25.0-29.9) 05/23/2016  . Edema of right lower extremity 05/23/2016    No Known Allergies  History reviewed. No pertinent surgical history.  Social History   Tobacco Use  . Smoking status: Former Smoker    Packs/day: 0.25    Years: 50.00    Pack years:  12.50    Types: Cigarettes    Quit date: 2009    Years since quitting: 11.5  . Smokeless  tobacco: Never Used  . Tobacco comment: smoking cessation materials not required  Substance Use Topics  . Alcohol use: Not Currently  . Drug use: Never     Medication list has been reviewed and updated.  Current Meds  Medication Sig  . blood glucose meter kit and supplies KIT Dispense based on patient and insurance preference.ICD 10- E11.65 Diabetes Mellitus 2 Uncontrolled.Directions: Use to check Blood Sugar up to four times daily.  Marland Kitchen glucose blood test strip Use per insurance brand. Diagnosis:Diabetes Mellitus 2 Uncontrolled without Complications = D98. 65. Check Blood Sugar up to four times daily.  . Lancets MISC Use per insurance brand. To use with Glucometer to monitor sugar up to four times daily.  Marland Kitchen lisinopril-hydrochlorothiazide (PRINZIDE,ZESTORETIC) 10-12.5 MG tablet Take 1 tablet by mouth daily.  . Multiple Vitamin (MULTIVITAMIN) capsule Take 1 capsule by mouth daily.  . pravastatin (PRAVACHOL) 40 MG tablet Take 1 tablet (40 mg total) by mouth daily.  . sitaGLIPtin-metformin (JANUMET) 50-500 MG tablet Take 1 tablet by mouth 2 (two) times daily with a meal.  . Vitamin D, Cholecalciferol, 25 MCG (1000 UT) CAPS Take 1 capsule by mouth daily.    PHQ 2/9 Scores 08/17/2018 07/16/2018 04/27/2018 12/29/2017  PHQ - 2 Score 0 0 0 0  PHQ- 9 Score 0 - - -    BP Readings from Last 3 Encounters:  08/17/18 132/74  07/16/18 (!) 144/88  04/27/18 128/78    Physical Exam Vitals signs and nursing note reviewed.  Constitutional:      General: She is not in acute distress.    Appearance: She is well-developed.  HENT:     Head: Normocephalic and atraumatic.     Right Ear: Tympanic membrane and ear canal normal.     Left Ear: Tympanic membrane and ear canal normal.     Nose:     Right Sinus: No maxillary sinus tenderness.     Left Sinus: No maxillary sinus tenderness.     Mouth/Throat:     Pharynx: Uvula midline.  Eyes:     General: No scleral icterus.       Right eye: No discharge.         Left eye: No discharge.     Conjunctiva/sclera: Conjunctivae normal.  Neck:     Musculoskeletal: Normal range of motion. No erythema.     Thyroid: No thyromegaly.     Vascular: No carotid bruit.  Cardiovascular:     Rate and Rhythm: Normal rate and regular rhythm.     Pulses:          Radial pulses are 2+ on the right side and 2+ on the left side.       Dorsalis pedis pulses are 1+ on the right side and 1+ on the left side.     Heart sounds: Normal heart sounds.     Comments: TED hose on both LEs Pulmonary:     Effort: Pulmonary effort is normal. No respiratory distress.     Breath sounds: No wheezing.  Chest:     Breasts:        Right: No mass, nipple discharge, skin change or tenderness.        Left: No mass, nipple discharge, skin change or tenderness.  Abdominal:     General: Bowel sounds are normal.     Palpations: Abdomen is soft.  Tenderness: There is no abdominal tenderness.  Musculoskeletal: Normal range of motion.  Lymphadenopathy:     Cervical: No cervical adenopathy.  Skin:    General: Skin is warm and dry.     Findings: No rash.  Neurological:     Mental Status: She is alert and oriented to person, place, and time.     Cranial Nerves: No cranial nerve deficit.     Sensory: No sensory deficit.     Deep Tendon Reflexes: Reflexes are normal and symmetric.  Psychiatric:        Attention and Perception: Attention normal.        Mood and Affect: Mood normal.        Speech: Speech normal.        Behavior: Behavior normal.        Thought Content: Thought content normal.     Wt Readings from Last 3 Encounters:  08/17/18 210 lb (95.3 kg)  07/16/18 208 lb 12.8 oz (94.7 kg)  04/27/18 210 lb (95.3 kg)    BP 132/74   Pulse 76   Temp (!) 97.3 F (36.3 C) (Temporal)   Ht '5\' 11"'  (1.803 m)   Wt 210 lb (95.3 kg)   SpO2 96%   BMI 29.29 kg/m   Assessment and Plan: 1. Annual physical exam Normal exam except for weight  2. Encounter for screening  mammogram for breast cancer Pt declines  3. Essential hypertension controlled - lisinopril-hydrochlorothiazide (ZESTORETIC) 10-12.5 MG tablet; Take 1 tablet by mouth daily.  Dispense: 90 tablet; Refill: 3 - CBC with Differential/Platelet - POCT urinalysis dipstick  4. Type II diabetes mellitus with complication (HCC) Continue current medication Schedule eye exam regular exercise - sitaGLIPtin-metformin (JANUMET) 50-500 MG tablet; Take 1 tablet by mouth 2 (two) times daily with a meal.  Dispense: 180 tablet; Refill: 3 - Comprehensive metabolic panel - Hemoglobin A1c - TSH  5. Hyperlipidemia associated with type 2 diabetes mellitus ([place]) On statin therapy - pravastatin (PRAVACHOL) 40 MG tablet; Take 1 tablet (40 mg total) by mouth daily.  Dispense: 90 tablet; Refill: 3 - Lipid panel  7. Peripheral vascular disease with stasis dermatitis Controlled with compression stockings     Partially dictated using Editor, commissioning. Any errors are unintentional.  Halina Maidens, MD [facility]  08/17/2018

## 2018-08-17 NOTE — Patient Instructions (Signed)
Schedule Diabetic Eye exam yearly.

## 2018-08-18 LAB — CBC WITH DIFFERENTIAL/PLATELET
Basophils Absolute: 0 10*3/uL (ref 0.0–0.2)
Basos: 1 %
EOS (ABSOLUTE): 0.4 10*3/uL (ref 0.0–0.4)
Eos: 6 %
Hematocrit: 38.6 % (ref 34.0–46.6)
Hemoglobin: 12.5 g/dL (ref 11.1–15.9)
Immature Grans (Abs): 0 10*3/uL (ref 0.0–0.1)
Immature Granulocytes: 0 %
Lymphocytes Absolute: 2.7 10*3/uL (ref 0.7–3.1)
Lymphs: 42 %
MCH: 28 pg (ref 26.6–33.0)
MCHC: 32.4 g/dL (ref 31.5–35.7)
MCV: 86 fL (ref 79–97)
Monocytes Absolute: 0.5 10*3/uL (ref 0.1–0.9)
Monocytes: 9 %
Neutrophils Absolute: 2.7 10*3/uL (ref 1.4–7.0)
Neutrophils: 42 %
Platelets: 208 10*3/uL (ref 150–450)
RBC: 4.47 x10E6/uL (ref 3.77–5.28)
RDW: 13.9 % (ref 11.7–15.4)
WBC: 6.3 10*3/uL (ref 3.4–10.8)

## 2018-08-18 LAB — COMPREHENSIVE METABOLIC PANEL
ALT: 22 IU/L (ref 0–32)
AST: 19 IU/L (ref 0–40)
Albumin/Globulin Ratio: 1.5 (ref 1.2–2.2)
Albumin: 4.4 g/dL (ref 3.7–4.7)
Alkaline Phosphatase: 81 IU/L (ref 39–117)
BUN/Creatinine Ratio: 19 (ref 12–28)
BUN: 18 mg/dL (ref 8–27)
Bilirubin Total: 0.3 mg/dL (ref 0.0–1.2)
CO2: 22 mmol/L (ref 20–29)
Calcium: 10.8 mg/dL — ABNORMAL HIGH (ref 8.7–10.3)
Chloride: 101 mmol/L (ref 96–106)
Creatinine, Ser: 0.96 mg/dL (ref 0.57–1.00)
GFR calc Af Amer: 66 mL/min/{1.73_m2} (ref >59)
GFR calc non Af Amer: 57 mL/min/{1.73_m2} — ABNORMAL LOW (ref >59)
Globulin, Total: 3 g/dL (ref 1.5–4.5)
Glucose: 121 mg/dL — ABNORMAL HIGH (ref 65–99)
Potassium: 4.5 mmol/L (ref 3.5–5.2)
Sodium: 139 mmol/L (ref 134–144)
Total Protein: 7.4 g/dL (ref 6.0–8.5)

## 2018-08-18 LAB — LIPID PANEL
Chol/HDL Ratio: 3.3 ratio (ref 0.0–4.4)
Cholesterol, Total: 154 mg/dL (ref 100–199)
HDL: 47 mg/dL (ref >39)
LDL Calculated: 81 mg/dL (ref 0–99)
Triglycerides: 128 mg/dL (ref 0–149)
VLDL Cholesterol Cal: 26 mg/dL (ref 5–40)

## 2018-08-18 LAB — TSH: TSH: 2.26 u[IU]/mL (ref 0.450–4.500)

## 2018-08-18 LAB — HEMOGLOBIN A1C
Est. average glucose Bld gHb Est-mCnc: 169 mg/dL
Hgb A1c MFr Bld: 7.5 % — ABNORMAL HIGH (ref 4.8–5.6)

## 2018-08-31 ENCOUNTER — Encounter: Payer: Medicare HMO | Admitting: Family Medicine

## 2018-09-03 ENCOUNTER — Other Ambulatory Visit: Payer: Self-pay | Admitting: Internal Medicine

## 2018-09-03 DIAGNOSIS — E1121 Type 2 diabetes mellitus with diabetic nephropathy: Secondary | ICD-10-CM

## 2018-12-11 DIAGNOSIS — R569 Unspecified convulsions: Secondary | ICD-10-CM | POA: Diagnosis not present

## 2018-12-11 DIAGNOSIS — I6782 Cerebral ischemia: Secondary | ICD-10-CM | POA: Diagnosis not present

## 2018-12-11 DIAGNOSIS — Z8673 Personal history of transient ischemic attack (TIA), and cerebral infarction without residual deficits: Secondary | ICD-10-CM | POA: Diagnosis not present

## 2018-12-11 DIAGNOSIS — R4189 Other symptoms and signs involving cognitive functions and awareness: Secondary | ICD-10-CM | POA: Diagnosis not present

## 2018-12-11 DIAGNOSIS — E785 Hyperlipidemia, unspecified: Secondary | ICD-10-CM | POA: Diagnosis not present

## 2018-12-11 DIAGNOSIS — G40901 Epilepsy, unspecified, not intractable, with status epilepticus: Secondary | ICD-10-CM | POA: Diagnosis not present

## 2018-12-11 DIAGNOSIS — R4182 Altered mental status, unspecified: Secondary | ICD-10-CM | POA: Diagnosis not present

## 2018-12-11 DIAGNOSIS — R4701 Aphasia: Secondary | ICD-10-CM | POA: Diagnosis not present

## 2018-12-11 DIAGNOSIS — Z8679 Personal history of other diseases of the circulatory system: Secondary | ICD-10-CM | POA: Diagnosis not present

## 2018-12-11 DIAGNOSIS — R29810 Facial weakness: Secondary | ICD-10-CM | POA: Diagnosis not present

## 2018-12-11 DIAGNOSIS — R404 Transient alteration of awareness: Secondary | ICD-10-CM | POA: Diagnosis not present

## 2018-12-11 DIAGNOSIS — R531 Weakness: Secondary | ICD-10-CM | POA: Diagnosis not present

## 2018-12-11 DIAGNOSIS — R2681 Unsteadiness on feet: Secondary | ICD-10-CM | POA: Diagnosis not present

## 2018-12-11 DIAGNOSIS — R9401 Abnormal electroencephalogram [EEG]: Secondary | ICD-10-CM | POA: Diagnosis not present

## 2018-12-11 DIAGNOSIS — Z7984 Long term (current) use of oral hypoglycemic drugs: Secondary | ICD-10-CM | POA: Diagnosis not present

## 2018-12-11 DIAGNOSIS — I639 Cerebral infarction, unspecified: Secondary | ICD-10-CM | POA: Diagnosis not present

## 2018-12-11 DIAGNOSIS — Z20828 Contact with and (suspected) exposure to other viral communicable diseases: Secondary | ICD-10-CM | POA: Diagnosis not present

## 2018-12-11 DIAGNOSIS — R739 Hyperglycemia, unspecified: Secondary | ICD-10-CM | POA: Diagnosis not present

## 2018-12-11 DIAGNOSIS — R0989 Other specified symptoms and signs involving the circulatory and respiratory systems: Secondary | ICD-10-CM | POA: Diagnosis not present

## 2018-12-11 DIAGNOSIS — I69298 Other sequelae of other nontraumatic intracranial hemorrhage: Secondary | ICD-10-CM | POA: Diagnosis not present

## 2018-12-11 DIAGNOSIS — E119 Type 2 diabetes mellitus without complications: Secondary | ICD-10-CM | POA: Diagnosis not present

## 2018-12-12 DIAGNOSIS — R0989 Other specified symptoms and signs involving the circulatory and respiratory systems: Secondary | ICD-10-CM | POA: Diagnosis not present

## 2018-12-13 DIAGNOSIS — I639 Cerebral infarction, unspecified: Secondary | ICD-10-CM | POA: Diagnosis not present

## 2018-12-18 ENCOUNTER — Ambulatory Visit: Payer: Medicare HMO | Admitting: Internal Medicine

## 2018-12-18 DIAGNOSIS — R2681 Unsteadiness on feet: Secondary | ICD-10-CM | POA: Diagnosis not present

## 2018-12-18 DIAGNOSIS — G40901 Epilepsy, unspecified, not intractable, with status epilepticus: Secondary | ICD-10-CM | POA: Diagnosis not present

## 2018-12-18 MED ORDER — QUINERVA 260 MG PO TABS
650.00 | ORAL_TABLET | ORAL | Status: DC
Start: ? — End: 2018-12-18

## 2018-12-18 MED ORDER — PRAVASTATIN SODIUM 40 MG PO TABS
40.00 | ORAL_TABLET | ORAL | Status: DC
Start: 2018-12-19 — End: 2018-12-18

## 2018-12-18 MED ORDER — ENOXAPARIN SODIUM 40 MG/0.4ML ~~LOC~~ SOLN
40.00 | SUBCUTANEOUS | Status: DC
Start: 2018-12-18 — End: 2018-12-18

## 2018-12-18 MED ORDER — BENICAR 20 MG PO TABS
12.50 | ORAL_TABLET | ORAL | Status: DC
Start: ? — End: 2018-12-18

## 2018-12-18 MED ORDER — LEVETIRACETAM 500 MG PO TABS
1000.00 | ORAL_TABLET | ORAL | Status: DC
Start: 2018-12-18 — End: 2018-12-18

## 2018-12-18 MED ORDER — INSULIN REGULAR HUMAN 100 UNIT/ML IJ SOLN
0.00 | INTRAMUSCULAR | Status: DC
Start: 2018-12-18 — End: 2018-12-18

## 2018-12-18 MED ORDER — GENERIC EXTERNAL MEDICATION
Status: DC
Start: 2018-12-19 — End: 2018-12-18

## 2018-12-19 DIAGNOSIS — Z7984 Long term (current) use of oral hypoglycemic drugs: Secondary | ICD-10-CM | POA: Diagnosis not present

## 2018-12-19 DIAGNOSIS — Z9181 History of falling: Secondary | ICD-10-CM | POA: Diagnosis not present

## 2018-12-19 DIAGNOSIS — E119 Type 2 diabetes mellitus without complications: Secondary | ICD-10-CM | POA: Diagnosis not present

## 2018-12-19 DIAGNOSIS — R4701 Aphasia: Secondary | ICD-10-CM | POA: Diagnosis not present

## 2018-12-19 DIAGNOSIS — H518 Other specified disorders of binocular movement: Secondary | ICD-10-CM | POA: Diagnosis not present

## 2018-12-19 DIAGNOSIS — G40901 Epilepsy, unspecified, not intractable, with status epilepticus: Secondary | ICD-10-CM | POA: Diagnosis not present

## 2018-12-20 DIAGNOSIS — G40901 Epilepsy, unspecified, not intractable, with status epilepticus: Secondary | ICD-10-CM | POA: Diagnosis not present

## 2018-12-20 DIAGNOSIS — H518 Other specified disorders of binocular movement: Secondary | ICD-10-CM | POA: Diagnosis not present

## 2018-12-20 DIAGNOSIS — E119 Type 2 diabetes mellitus without complications: Secondary | ICD-10-CM | POA: Diagnosis not present

## 2018-12-20 DIAGNOSIS — Z9181 History of falling: Secondary | ICD-10-CM | POA: Diagnosis not present

## 2018-12-20 DIAGNOSIS — R4701 Aphasia: Secondary | ICD-10-CM | POA: Diagnosis not present

## 2018-12-20 DIAGNOSIS — Z7984 Long term (current) use of oral hypoglycemic drugs: Secondary | ICD-10-CM | POA: Diagnosis not present

## 2018-12-25 DIAGNOSIS — H518 Other specified disorders of binocular movement: Secondary | ICD-10-CM | POA: Diagnosis not present

## 2018-12-25 DIAGNOSIS — Z7984 Long term (current) use of oral hypoglycemic drugs: Secondary | ICD-10-CM | POA: Diagnosis not present

## 2018-12-25 DIAGNOSIS — G40901 Epilepsy, unspecified, not intractable, with status epilepticus: Secondary | ICD-10-CM | POA: Diagnosis not present

## 2018-12-25 DIAGNOSIS — Z9181 History of falling: Secondary | ICD-10-CM | POA: Diagnosis not present

## 2018-12-25 DIAGNOSIS — E119 Type 2 diabetes mellitus without complications: Secondary | ICD-10-CM | POA: Diagnosis not present

## 2018-12-25 DIAGNOSIS — R4701 Aphasia: Secondary | ICD-10-CM | POA: Diagnosis not present

## 2018-12-27 DIAGNOSIS — Z9181 History of falling: Secondary | ICD-10-CM | POA: Diagnosis not present

## 2018-12-27 DIAGNOSIS — H518 Other specified disorders of binocular movement: Secondary | ICD-10-CM | POA: Diagnosis not present

## 2018-12-27 DIAGNOSIS — E119 Type 2 diabetes mellitus without complications: Secondary | ICD-10-CM | POA: Diagnosis not present

## 2018-12-27 DIAGNOSIS — Z7984 Long term (current) use of oral hypoglycemic drugs: Secondary | ICD-10-CM | POA: Diagnosis not present

## 2018-12-27 DIAGNOSIS — R4701 Aphasia: Secondary | ICD-10-CM | POA: Diagnosis not present

## 2018-12-27 DIAGNOSIS — G40901 Epilepsy, unspecified, not intractable, with status epilepticus: Secondary | ICD-10-CM | POA: Diagnosis not present

## 2018-12-29 DIAGNOSIS — R4701 Aphasia: Secondary | ICD-10-CM | POA: Diagnosis not present

## 2018-12-29 DIAGNOSIS — Z9181 History of falling: Secondary | ICD-10-CM | POA: Diagnosis not present

## 2018-12-29 DIAGNOSIS — H518 Other specified disorders of binocular movement: Secondary | ICD-10-CM | POA: Diagnosis not present

## 2018-12-29 DIAGNOSIS — Z7984 Long term (current) use of oral hypoglycemic drugs: Secondary | ICD-10-CM | POA: Diagnosis not present

## 2018-12-29 DIAGNOSIS — G40901 Epilepsy, unspecified, not intractable, with status epilepticus: Secondary | ICD-10-CM | POA: Diagnosis not present

## 2018-12-29 DIAGNOSIS — E119 Type 2 diabetes mellitus without complications: Secondary | ICD-10-CM | POA: Diagnosis not present

## 2018-12-31 ENCOUNTER — Other Ambulatory Visit: Payer: Self-pay | Admitting: Internal Medicine

## 2018-12-31 DIAGNOSIS — I69398 Other sequelae of cerebral infarction: Secondary | ICD-10-CM

## 2018-12-31 DIAGNOSIS — I639 Cerebral infarction, unspecified: Secondary | ICD-10-CM | POA: Insufficient documentation

## 2018-12-31 DIAGNOSIS — G40909 Epilepsy, unspecified, not intractable, without status epilepticus: Secondary | ICD-10-CM | POA: Insufficient documentation

## 2019-01-01 DIAGNOSIS — Z9181 History of falling: Secondary | ICD-10-CM | POA: Diagnosis not present

## 2019-01-01 DIAGNOSIS — H518 Other specified disorders of binocular movement: Secondary | ICD-10-CM | POA: Diagnosis not present

## 2019-01-01 DIAGNOSIS — Z7984 Long term (current) use of oral hypoglycemic drugs: Secondary | ICD-10-CM | POA: Diagnosis not present

## 2019-01-01 DIAGNOSIS — E119 Type 2 diabetes mellitus without complications: Secondary | ICD-10-CM | POA: Diagnosis not present

## 2019-01-01 DIAGNOSIS — G40901 Epilepsy, unspecified, not intractable, with status epilepticus: Secondary | ICD-10-CM | POA: Diagnosis not present

## 2019-01-01 DIAGNOSIS — R4701 Aphasia: Secondary | ICD-10-CM | POA: Diagnosis not present

## 2019-01-04 DIAGNOSIS — G40901 Epilepsy, unspecified, not intractable, with status epilepticus: Secondary | ICD-10-CM | POA: Diagnosis not present

## 2019-01-04 DIAGNOSIS — E119 Type 2 diabetes mellitus without complications: Secondary | ICD-10-CM | POA: Diagnosis not present

## 2019-01-04 DIAGNOSIS — Z9181 History of falling: Secondary | ICD-10-CM | POA: Diagnosis not present

## 2019-01-04 DIAGNOSIS — R4701 Aphasia: Secondary | ICD-10-CM | POA: Diagnosis not present

## 2019-01-04 DIAGNOSIS — Z7984 Long term (current) use of oral hypoglycemic drugs: Secondary | ICD-10-CM | POA: Diagnosis not present

## 2019-01-04 DIAGNOSIS — H518 Other specified disorders of binocular movement: Secondary | ICD-10-CM | POA: Diagnosis not present

## 2019-01-08 DIAGNOSIS — G40901 Epilepsy, unspecified, not intractable, with status epilepticus: Secondary | ICD-10-CM | POA: Diagnosis not present

## 2019-01-08 DIAGNOSIS — Z9181 History of falling: Secondary | ICD-10-CM | POA: Diagnosis not present

## 2019-01-08 DIAGNOSIS — E119 Type 2 diabetes mellitus without complications: Secondary | ICD-10-CM | POA: Diagnosis not present

## 2019-01-08 DIAGNOSIS — Z7984 Long term (current) use of oral hypoglycemic drugs: Secondary | ICD-10-CM | POA: Diagnosis not present

## 2019-01-08 DIAGNOSIS — R4701 Aphasia: Secondary | ICD-10-CM | POA: Diagnosis not present

## 2019-01-08 DIAGNOSIS — H518 Other specified disorders of binocular movement: Secondary | ICD-10-CM | POA: Diagnosis not present

## 2019-01-10 DIAGNOSIS — Z7984 Long term (current) use of oral hypoglycemic drugs: Secondary | ICD-10-CM | POA: Diagnosis not present

## 2019-01-10 DIAGNOSIS — R4701 Aphasia: Secondary | ICD-10-CM | POA: Diagnosis not present

## 2019-01-10 DIAGNOSIS — G40901 Epilepsy, unspecified, not intractable, with status epilepticus: Secondary | ICD-10-CM | POA: Diagnosis not present

## 2019-01-10 DIAGNOSIS — E119 Type 2 diabetes mellitus without complications: Secondary | ICD-10-CM | POA: Diagnosis not present

## 2019-01-10 DIAGNOSIS — Z9181 History of falling: Secondary | ICD-10-CM | POA: Diagnosis not present

## 2019-01-10 DIAGNOSIS — H518 Other specified disorders of binocular movement: Secondary | ICD-10-CM | POA: Diagnosis not present

## 2019-01-15 ENCOUNTER — Other Ambulatory Visit: Payer: Self-pay | Admitting: Internal Medicine

## 2019-01-15 DIAGNOSIS — Z7984 Long term (current) use of oral hypoglycemic drugs: Secondary | ICD-10-CM

## 2019-01-15 DIAGNOSIS — H518 Other specified disorders of binocular movement: Secondary | ICD-10-CM

## 2019-01-15 DIAGNOSIS — R4701 Aphasia: Secondary | ICD-10-CM

## 2019-01-15 DIAGNOSIS — Z9181 History of falling: Secondary | ICD-10-CM

## 2019-01-15 DIAGNOSIS — E119 Type 2 diabetes mellitus without complications: Secondary | ICD-10-CM

## 2019-01-15 DIAGNOSIS — G40901 Epilepsy, unspecified, not intractable, with status epilepticus: Secondary | ICD-10-CM

## 2019-01-15 NOTE — Progress Notes (Signed)
Received orders from [facility]. Start of care 12/19/2018 through 02/16/2019. Orders are reviewed, signed and faxed.

## 2019-01-17 DIAGNOSIS — Z9181 History of falling: Secondary | ICD-10-CM | POA: Diagnosis not present

## 2019-01-17 DIAGNOSIS — E119 Type 2 diabetes mellitus without complications: Secondary | ICD-10-CM | POA: Diagnosis not present

## 2019-01-17 DIAGNOSIS — Z7984 Long term (current) use of oral hypoglycemic drugs: Secondary | ICD-10-CM | POA: Diagnosis not present

## 2019-01-17 DIAGNOSIS — H518 Other specified disorders of binocular movement: Secondary | ICD-10-CM | POA: Diagnosis not present

## 2019-01-17 DIAGNOSIS — G40901 Epilepsy, unspecified, not intractable, with status epilepticus: Secondary | ICD-10-CM | POA: Diagnosis not present

## 2019-01-17 DIAGNOSIS — R4701 Aphasia: Secondary | ICD-10-CM | POA: Diagnosis not present

## 2019-01-22 DIAGNOSIS — E119 Type 2 diabetes mellitus without complications: Secondary | ICD-10-CM | POA: Diagnosis not present

## 2019-01-22 DIAGNOSIS — G40901 Epilepsy, unspecified, not intractable, with status epilepticus: Secondary | ICD-10-CM | POA: Diagnosis not present

## 2019-01-22 DIAGNOSIS — Z7984 Long term (current) use of oral hypoglycemic drugs: Secondary | ICD-10-CM | POA: Diagnosis not present

## 2019-01-22 DIAGNOSIS — R4701 Aphasia: Secondary | ICD-10-CM | POA: Diagnosis not present

## 2019-01-22 DIAGNOSIS — H518 Other specified disorders of binocular movement: Secondary | ICD-10-CM | POA: Diagnosis not present

## 2019-01-22 DIAGNOSIS — Z9181 History of falling: Secondary | ICD-10-CM | POA: Diagnosis not present

## 2019-01-29 DIAGNOSIS — Z7984 Long term (current) use of oral hypoglycemic drugs: Secondary | ICD-10-CM | POA: Diagnosis not present

## 2019-01-29 DIAGNOSIS — R4701 Aphasia: Secondary | ICD-10-CM | POA: Diagnosis not present

## 2019-01-29 DIAGNOSIS — G40901 Epilepsy, unspecified, not intractable, with status epilepticus: Secondary | ICD-10-CM | POA: Diagnosis not present

## 2019-01-29 DIAGNOSIS — E119 Type 2 diabetes mellitus without complications: Secondary | ICD-10-CM | POA: Diagnosis not present

## 2019-01-29 DIAGNOSIS — H518 Other specified disorders of binocular movement: Secondary | ICD-10-CM | POA: Diagnosis not present

## 2019-01-29 DIAGNOSIS — Z9181 History of falling: Secondary | ICD-10-CM | POA: Diagnosis not present

## 2019-04-01 ENCOUNTER — Inpatient Hospital Stay
Admission: EM | Admit: 2019-04-01 | Discharge: 2019-04-03 | DRG: 057 | Disposition: A | Discharge status: Home / Self Care | Payer: Medicare HMO | Attending: Internal Medicine | Admitting: Internal Medicine

## 2019-04-01 ENCOUNTER — Inpatient Hospital Stay: Payer: Medicare HMO

## 2019-04-01 ENCOUNTER — Encounter: Payer: Self-pay | Admitting: Intensive Care

## 2019-04-01 ENCOUNTER — Other Ambulatory Visit: Payer: Self-pay

## 2019-04-01 ENCOUNTER — Emergency Department: Payer: Medicare HMO

## 2019-04-01 DIAGNOSIS — E785 Hyperlipidemia, unspecified: Secondary | ICD-10-CM | POA: Diagnosis present

## 2019-04-01 DIAGNOSIS — I639 Cerebral infarction, unspecified: Secondary | ICD-10-CM | POA: Diagnosis not present

## 2019-04-01 DIAGNOSIS — E1151 Type 2 diabetes mellitus with diabetic peripheral angiopathy without gangrene: Secondary | ICD-10-CM | POA: Diagnosis present

## 2019-04-01 DIAGNOSIS — R778 Other specified abnormalities of plasma proteins: Secondary | ICD-10-CM | POA: Diagnosis not present

## 2019-04-01 DIAGNOSIS — B9620 Unspecified Escherichia coli [E. coli] as the cause of diseases classified elsewhere: Secondary | ICD-10-CM | POA: Diagnosis present

## 2019-04-01 DIAGNOSIS — I69398 Other sequelae of cerebral infarction: Secondary | ICD-10-CM | POA: Diagnosis not present

## 2019-04-01 DIAGNOSIS — I361 Nonrheumatic tricuspid (valve) insufficiency: Secondary | ICD-10-CM | POA: Diagnosis not present

## 2019-04-01 DIAGNOSIS — I248 Other forms of acute ischemic heart disease: Secondary | ICD-10-CM | POA: Diagnosis present

## 2019-04-01 DIAGNOSIS — E538 Deficiency of other specified B group vitamins: Secondary | ICD-10-CM | POA: Diagnosis present

## 2019-04-01 DIAGNOSIS — Z79899 Other long term (current) drug therapy: Secondary | ICD-10-CM

## 2019-04-01 DIAGNOSIS — I119 Hypertensive heart disease without heart failure: Secondary | ICD-10-CM | POA: Diagnosis present

## 2019-04-01 DIAGNOSIS — R911 Solitary pulmonary nodule: Secondary | ICD-10-CM | POA: Diagnosis present

## 2019-04-01 DIAGNOSIS — I2510 Atherosclerotic heart disease of native coronary artery without angina pectoris: Secondary | ICD-10-CM | POA: Diagnosis present

## 2019-04-01 DIAGNOSIS — G8384 Todd's paralysis (postepileptic): Secondary | ICD-10-CM | POA: Diagnosis present

## 2019-04-01 DIAGNOSIS — Z87891 Personal history of nicotine dependence: Secondary | ICD-10-CM | POA: Diagnosis not present

## 2019-04-01 DIAGNOSIS — E1169 Type 2 diabetes mellitus with other specified complication: Secondary | ICD-10-CM | POA: Diagnosis present

## 2019-04-01 DIAGNOSIS — G40909 Epilepsy, unspecified, not intractable, without status epilepticus: Secondary | ICD-10-CM

## 2019-04-01 DIAGNOSIS — G40401 Other generalized epilepsy and epileptic syndromes, not intractable, with status epilepticus: Secondary | ICD-10-CM | POA: Diagnosis present

## 2019-04-01 DIAGNOSIS — R569 Unspecified convulsions: Secondary | ICD-10-CM | POA: Diagnosis present

## 2019-04-01 DIAGNOSIS — G9349 Other encephalopathy: Secondary | ICD-10-CM | POA: Diagnosis present

## 2019-04-01 DIAGNOSIS — G40901 Epilepsy, unspecified, not intractable, with status epilepticus: Secondary | ICD-10-CM

## 2019-04-01 DIAGNOSIS — Z20822 Contact with and (suspected) exposure to covid-19: Secondary | ICD-10-CM | POA: Diagnosis present

## 2019-04-01 DIAGNOSIS — R29810 Facial weakness: Secondary | ICD-10-CM | POA: Diagnosis present

## 2019-04-01 DIAGNOSIS — E118 Type 2 diabetes mellitus with unspecified complications: Secondary | ICD-10-CM | POA: Diagnosis present

## 2019-04-01 DIAGNOSIS — Z7984 Long term (current) use of oral hypoglycemic drugs: Secondary | ICD-10-CM

## 2019-04-01 DIAGNOSIS — N390 Urinary tract infection, site not specified: Secondary | ICD-10-CM | POA: Diagnosis present

## 2019-04-01 DIAGNOSIS — I619 Nontraumatic intracerebral hemorrhage, unspecified: Secondary | ICD-10-CM | POA: Diagnosis present

## 2019-04-01 DIAGNOSIS — I10 Essential (primary) hypertension: Secondary | ICD-10-CM | POA: Diagnosis present

## 2019-04-01 DIAGNOSIS — I340 Nonrheumatic mitral (valve) insufficiency: Secondary | ICD-10-CM | POA: Diagnosis not present

## 2019-04-01 DIAGNOSIS — I472 Ventricular tachycardia: Secondary | ICD-10-CM | POA: Diagnosis not present

## 2019-04-01 DIAGNOSIS — I214 Non-ST elevation (NSTEMI) myocardial infarction: Secondary | ICD-10-CM | POA: Diagnosis present

## 2019-04-01 LAB — CK: Total CK: 101 U/L (ref 38–234)

## 2019-04-01 LAB — CBC WITH DIFFERENTIAL/PLATELET
Abs Immature Granulocytes: 0.07 10*3/uL (ref 0.00–0.07)
Basophils Absolute: 0 10*3/uL (ref 0.0–0.1)
Basophils Relative: 1 %
Eosinophils Absolute: 0.1 10*3/uL (ref 0.0–0.5)
Eosinophils Relative: 3 %
HCT: 36.9 % (ref 36.0–46.0)
Hemoglobin: 11.6 g/dL — ABNORMAL LOW (ref 12.0–15.0)
Immature Granulocytes: 1 %
Lymphocytes Relative: 26 %
Lymphs Abs: 1.4 10*3/uL (ref 0.7–4.0)
MCH: 28.6 pg (ref 26.0–34.0)
MCHC: 31.4 g/dL (ref 30.0–36.0)
MCV: 90.9 fL (ref 80.0–100.0)
Monocytes Absolute: 0.2 10*3/uL (ref 0.1–1.0)
Monocytes Relative: 4 %
Neutro Abs: 3.5 10*3/uL (ref 1.7–7.7)
Neutrophils Relative %: 65 %
Platelets: 190 10*3/uL (ref 150–400)
RBC: 4.06 MIL/uL (ref 3.87–5.11)
RDW: 14.2 % (ref 11.5–15.5)
WBC: 5.3 10*3/uL (ref 4.0–10.5)
nRBC: 0 % (ref 0.0–0.2)

## 2019-04-01 LAB — COMPREHENSIVE METABOLIC PANEL
ALT: 26 U/L (ref 0–44)
AST: 28 U/L (ref 15–41)
Albumin: 3.7 g/dL (ref 3.5–5.0)
Alkaline Phosphatase: 68 U/L (ref 38–126)
Anion gap: 6 (ref 5–15)
BUN: 17 mg/dL (ref 8–23)
CO2: 27 mmol/L (ref 22–32)
Calcium: 9.3 mg/dL (ref 8.9–10.3)
Chloride: 105 mmol/L (ref 98–111)
Creatinine, Ser: 1.12 mg/dL — ABNORMAL HIGH (ref 0.44–1.00)
GFR calc Af Amer: 54 mL/min — ABNORMAL LOW (ref >60)
GFR calc non Af Amer: 47 mL/min — ABNORMAL LOW (ref >60)
Glucose, Bld: 197 mg/dL — ABNORMAL HIGH (ref 70–99)
Potassium: 5 mmol/L (ref 3.5–5.1)
Sodium: 138 mmol/L (ref 135–145)
Total Bilirubin: 0.5 mg/dL (ref 0.3–1.2)
Total Protein: 7.2 g/dL (ref 6.5–8.1)

## 2019-04-01 LAB — HEMOGLOBIN A1C
Hgb A1c MFr Bld: 7.3 % — ABNORMAL HIGH (ref 4.8–5.6)
Mean Plasma Glucose: 162.81 mg/dL

## 2019-04-01 LAB — URINALYSIS, COMPLETE (UACMP) WITH MICROSCOPIC
Bacteria, UA: NONE SEEN
Bilirubin Urine: NEGATIVE
Glucose, UA: NEGATIVE mg/dL
Ketones, ur: NEGATIVE mg/dL
Nitrite: NEGATIVE
Protein, ur: 30 mg/dL — AB
RBC / HPF: >50 RBC/hpf — ABNORMAL HIGH (ref 0–5)
Specific Gravity, Urine: 1.013 (ref 1.005–1.030)
pH: 5 (ref 5.0–8.0)

## 2019-04-01 LAB — T4, FREE: Free T4: 0.72 ng/dL (ref 0.61–1.12)

## 2019-04-01 LAB — TROPONIN I (HIGH SENSITIVITY)
Troponin I (High Sensitivity): 137 ng/L (ref <18)
Troponin I (High Sensitivity): 147 ng/L (ref <18)
Troponin I (High Sensitivity): 146 ng/L (ref <18)
Troponin I (High Sensitivity): 147 ng/L (ref <18)

## 2019-04-01 LAB — BRAIN NATRIURETIC PEPTIDE: B Natriuretic Peptide: 83 pg/mL (ref 0.0–100.0)

## 2019-04-01 LAB — HEPATIC FUNCTION PANEL
ALT: 25 U/L (ref 0–44)
AST: 27 U/L (ref 15–41)
Albumin: 3.9 g/dL (ref 3.5–5.0)
Alkaline Phosphatase: 68 U/L (ref 38–126)
Bilirubin, Direct: <0.1 mg/dL (ref 0.0–0.2)
Total Bilirubin: 0.6 mg/dL (ref 0.3–1.2)
Total Protein: 8 g/dL (ref 6.5–8.1)

## 2019-04-01 LAB — GLUCOSE, CAPILLARY
Glucose-Capillary: 151 mg/dL — ABNORMAL HIGH (ref 70–99)
Glucose-Capillary: 109 mg/dL — ABNORMAL HIGH (ref 70–99)
Glucose-Capillary: 80 mg/dL (ref 70–99)

## 2019-04-01 LAB — VITAMIN B12: Vitamin B-12: 345 pg/mL (ref 180–914)

## 2019-04-01 LAB — TSH: TSH: 0.62 u[IU]/mL (ref 0.350–4.500)

## 2019-04-01 LAB — MAGNESIUM: Magnesium: 1.9 mg/dL (ref 1.7–2.4)

## 2019-04-01 LAB — RESPIRATORY PANEL BY RT PCR (FLU A&B, COVID)
Influenza A by PCR: NEGATIVE
Influenza B by PCR: NEGATIVE
SARS Coronavirus 2 by RT PCR: NEGATIVE

## 2019-04-01 LAB — FIBRIN DERIVATIVES D-DIMER (ARMC ONLY): Fibrin derivatives D-dimer (ARMC): 1987.93 ng/mL (FEU) — ABNORMAL HIGH (ref 0.00–499.00)

## 2019-04-01 MED ORDER — SODIUM CHLORIDE 0.9 % IV SOLN
1.0000 g | INTRAVENOUS | Status: DC
  Administered 2019-04-01 – 2019-04-02 (×2): 1 g via INTRAVENOUS
  Filled 2019-04-01 (×3): qty 10

## 2019-04-01 MED ORDER — SODIUM CHLORIDE 0.9 % IV BOLUS
500.0000 mL | Freq: Once | INTRAVENOUS | Status: AC
  Administered 2019-04-01: 10:00:00 500 mL via INTRAVENOUS

## 2019-04-01 MED ORDER — SODIUM CHLORIDE 0.9 % IV SOLN
75.0000 mL/h | INTRAVENOUS | Status: DC
  Administered 2019-04-01: 19:00:00 75 mL/h via INTRAVENOUS

## 2019-04-01 MED ORDER — LEVETIRACETAM IN NACL 1000 MG/100ML IV SOLN
1000.0000 mg | Freq: Two times a day (BID) | INTRAVENOUS | Status: DC
  Administered 2019-04-01 – 2019-04-03 (×4): 1000 mg via INTRAVENOUS
  Filled 2019-04-01 (×5): qty 100

## 2019-04-01 MED ORDER — LORAZEPAM 2 MG/ML IJ SOLN
1.0000 mg | Freq: Once | INTRAMUSCULAR | Status: AC
  Administered 2019-04-01: 11:00:00 1 mg via INTRAVENOUS
  Filled 2019-04-01: qty 1

## 2019-04-01 MED ORDER — LORAZEPAM 2 MG/ML IJ SOLN: 1.0000 mg | INTRAMUSCULAR | Status: DC | PRN

## 2019-04-01 MED ORDER — THIAMINE HCL 100 MG/ML IJ SOLN
100.0000 mg | INTRAMUSCULAR | Status: AC
  Administered 2019-04-01 – 2019-04-02 (×2): 100 mg via INTRAVENOUS
  Filled 2019-04-01 (×2): qty 2

## 2019-04-01 MED ORDER — METOPROLOL TARTRATE 5 MG/5ML IV SOLN
2.5000 mg | Freq: Three times a day (TID) | INTRAVENOUS | Status: DC
  Administered 2019-04-01 – 2019-04-02 (×2): 2.5 mg via INTRAVENOUS
  Filled 2019-04-01 (×2): qty 5

## 2019-04-01 MED ORDER — LEVETIRACETAM IN NACL 1000 MG/100ML IV SOLN
1000.0000 mg | Freq: Once | INTRAVENOUS | Status: AC
  Administered 2019-04-01: 11:00:00 1000 mg via INTRAVENOUS
  Filled 2019-04-01: qty 100

## 2019-04-01 MED ORDER — HEPARIN SODIUM (PORCINE) 5000 UNIT/ML IJ SOLN
5000.0000 [IU] | Freq: Three times a day (TID) | INTRAMUSCULAR | Status: DC
  Administered 2019-04-01 – 2019-04-03 (×5): 5000 [IU] via SUBCUTANEOUS
  Filled 2019-04-01 (×5): qty 1

## 2019-04-01 MED ORDER — INSULIN ASPART 100 UNIT/ML ~~LOC~~ SOLN
0.0000 [IU] | SUBCUTANEOUS | Status: DC
  Administered 2019-04-02 (×2): 2 [IU] via SUBCUTANEOUS
  Administered 2019-04-02: 16:00:00 3 [IU] via SUBCUTANEOUS
  Administered 2019-04-02: 05:00:00 2 [IU] via SUBCUTANEOUS
  Filled 2019-04-01 (×4): qty 1

## 2019-04-01 MED ORDER — HYDRALAZINE HCL 20 MG/ML IJ SOLN
10.0000 mg | Freq: Three times a day (TID) | INTRAMUSCULAR | Status: DC
  Administered 2019-04-01 – 2019-04-02 (×3): 10 mg via INTRAVENOUS
  Filled 2019-04-01 (×3): qty 1

## 2019-04-01 MED ORDER — INFLUENZA VAC A&B SA ADJ QUAD 0.5 ML IM PRSY
0.5000 mL | PREFILLED_SYRINGE | INTRAMUSCULAR | Status: DC
  Filled 2019-04-01: qty 0.5

## 2019-04-01 MED ORDER — ENALAPRILAT 1.25 MG/ML IV SOLN
0.6250 mg | Freq: Three times a day (TID) | INTRAVENOUS | Status: DC
  Administered 2019-04-01 – 2019-04-02 (×3): 0.625 mg via INTRAVENOUS
  Filled 2019-04-01 (×5): qty 0.5

## 2019-04-01 MED ORDER — IOHEXOL 350 MG/ML SOLN
75.0000 mL | Freq: Once | INTRAVENOUS | Status: AC | PRN
  Administered 2019-04-01: 21:00:00 75 mL via INTRAVENOUS

## 2019-04-01 MED ORDER — LEVETIRACETAM IN NACL 1000 MG/100ML IV SOLN
1000.0000 mg | Freq: Once | INTRAVENOUS | Status: AC
  Administered 2019-04-01: 10:00:00 1000 mg via INTRAVENOUS
  Filled 2019-04-01: qty 100

## 2019-04-01 MED ORDER — SODIUM CHLORIDE 0.9 % IV SOLN: Freq: Once | INTRAVENOUS | Status: AC

## 2019-04-01 MED ORDER — PNEUMOCOCCAL VAC POLYVALENT 25 MCG/0.5ML IJ INJ
0.5000 mL | INJECTION | INTRAMUSCULAR | Status: DC
  Filled 2019-04-01: qty 0.5

## 2019-04-01 NOTE — Assessment & Plan Note (Signed)
B12/folate check.

## 2019-04-01 NOTE — Progress Notes (Signed)
CRITICAL VALUE ALERT  Critical Value:  Troponin 147  Date & Time Notied:  04/01/19 2058  Provider Notified: Anna Genre, NP  Orders Received/Actions taken: NP made aware. Will continue to monitor.

## 2019-04-01 NOTE — ED Notes (Signed)
Pt SpO2 100%. [place] 3L

## 2019-04-01 NOTE — H&P (Addendum)
History and Physical    Zanyiah Posten LXB:[id number] DOB: Feb 15, 1940 DOA: 04/01/2019   PCP: Marygrace Drought, MD    Patient coming from: Home  Chief Complaint: Seizures.  HPI: Jatavia Keltner is a 79 y.o. female with medical history significant of seizure disorder seen in ed for seizure and encephalopathy. Pt was brought in via ems this morning when she was having seizure and was given versed. She has h/o htn /cva/ and seizures due to the [place] bleed in past. Pt was seen in ed by neurologist and has eeg reports shows encephalopathy report below.  ED Course:  Blood pressure (!) 146/75, pulse 68, temperature 98.6 F (37 C), temperature source Axillary, resp. rate 18, height '5\' 8"'  (1.727 m), weight 81.6 kg, SpO2 96 %.  Pt in ed is encephalopathic , responsive to stimuli when called but nonverbal.   Review of Systems:  unable to obtain due to encephalopathy.  Past Medical History:  Diagnosis Date  . Cellulitis of left foot 05/23/2016  . Diabetes mellitus without complication ([place])   . Diabetic ulcer of left foot ([place]) 05/23/2016  . Hyperlipidemia   . Hypertension   . Peripheral vascular disease ([place])   . Vitamin D deficiency 05/23/2016    History reviewed. No pertinent surgical history.   reports that she quit smoking about 12 years ago. Her smoking use included cigarettes. She has a 12.50 pack-year smoking history. She has never used smokeless tobacco. She reports current alcohol use. She reports that she does not use drugs.  No Known Allergies  Family History  Problem Relation Age of Onset  . Healthy Mother   . Healthy Father     Prior to Admission medications   Medication Sig Start Date End Date Taking? Authorizing Provider  Blood Glucose Monitoring Suppl (ACCU-CHEK GUIDE ME) w/Device KIT USE UP TO FOUR TIMES DAILY AS DIRECTED 09/03/18  Yes Glean Hess, MD  glucose blood test strip Use per insurance brand. Diagnosis:Diabetes Mellitus 2 Uncontrolled without  Complications = C16. 65. Check Blood Sugar up to four times daily. 08/23/16  Yes Glean Hess, MD  Lancets MISC Use per insurance brand. To use with Glucometer to monitor sugar up to four times daily. 08/19/16  Yes Plonk, Gwyndolyn Saxon, MD  levETIRAcetam (KEPPRA) 1000 MG tablet Take 1,000 mg by mouth 2 (two) times daily. 12/18/18  Yes [provider]  lisinopril-hydrochlorothiazide (ZESTORETIC) 10-12.5 MG tablet Take 1 tablet by mouth daily. 08/17/18  Yes Glean Hess, MD  Multiple Vitamin (MULTIVITAMIN) capsule Take 1 capsule by mouth daily.   Yes [provider]  pravastatin (PRAVACHOL) 40 MG tablet Take 1 tablet (40 mg total) by mouth daily. 08/17/18  Yes Glean Hess, MD  sitaGLIPtin-metformin (JANUMET) 50-500 MG tablet Take 1 tablet by mouth 2 (two) times daily with a meal. 08/17/18  Yes Glean Hess, MD  Vitamin D, Cholecalciferol, 25 MCG (1000 UT) CAPS Take 1 capsule by mouth daily.   Yes [provider]    Physical Exam: Vitals:   04/01/19 1515 04/01/19 1615 04/01/19 1619 04/01/19 1630  BP: (!) 142/87 (!) 145/72  (!) 146/75  Pulse: 73 68  68  Resp:    18  Temp:      TempSrc:      SpO2: 100% 94% 95% 96%  Weight:      Height:        Constitutional: NAD, calm, comfortable Vitals:   04/01/19 1515 04/01/19 1615 04/01/19 1619 04/01/19 1630  BP: (!) 142/87 Marland Kitchen)  145/72  (!) 146/75  Pulse: 73 68  68  Resp:    18  Temp:      TempSrc:      SpO2: 100% 94% 95% 96%  Weight:      Height:       Eyes: PERRL, lids and conjunctivae normal ENMT: Mucous membranes are moist.  Neck: normal, supple, no masses, no thyromegaly Respiratory: clear to auscultation bilaterally, no wheezing, no crackles. Normal respiratory effort. No accessory muscle use.  Cardiovascular: Regular rate and rhythm, no murmurs / rubs / gallops. No extremity edema. 2+ pedal pulses. No carotid bruits.  Abdomen: no tenderness, no masses palpated. No hepatosplenomegaly. Bowel sounds  positive.  Musculoskeletal: no clubbing / cyanosis. No joint deformity upper and lower extremities. Good ROM, no contractures. Normal muscle tone.  Skin: no rashes, lesions, ulcers. No induration Neurologic: CN 2-12 grossly intact. Psychiatric: Somnolent.  Labs on Admission: I have personally reviewed following labs and imaging studies.  CBC: Recent Labs  Lab 04/01/19 0954  WBC 5.3  NEUTROABS 3.5  HGB 11.6*  HCT 36.9  MCV 90.9  PLT 878   Basic Metabolic Panel: Recent Labs  Lab 04/01/19 0954  NA 138  K 5.0  CL 105  CO2 27  GLUCOSE 197*  BUN 17  CREATININE 1.12*  CALCIUM 9.3  MG 1.9   GFR: Estimated Creatinine Clearance: 46.4 mL/min (A) (by C-G formula based on SCr of 1.12 mg/dL (H)). Liver Function Tests: Recent Labs  Lab 04/01/19 0954 04/01/19 1545  AST 28 27  ALT 26 25  ALKPHOS 68 68  BILITOT 0.5 0.6  PROT 7.2 8.0  ALBUMIN 3.7 3.9   CBG: Recent Labs  Lab 04/01/19 0925 04/01/19 1733  GLUCAP 151* 109*   Lipid Profile: No results for input(s): CHOL, HDL, LDLCALC, TRIG, CHOLHDL, LDLDIRECT in the last 72 hours. Thyroid Function Tests: Recent Labs    04/01/19 1545  TSH 0.620  FREET4 0.72   Anemia Panel: No results for input(s): VITAMINB12, FOLATE, FERRITIN, TIBC, IRON, RETICCTPCT in the last 72 hours. Urine analysis:    Component Value Date/Time   COLORURINE YELLOW (A) 04/01/2019 1141   APPEARANCEUR CLEAR (A) 04/01/2019 1141   LABSPEC 1.013 04/01/2019 1141   PHURINE 5.0 04/01/2019 1141   GLUCOSEU NEGATIVE 04/01/2019 1141   HGBUR MODERATE (A) 04/01/2019 1141   BILIRUBINUR NEGATIVE 04/01/2019 1141   BILIRUBINUR neg 08/17/2018 0940   KETONESUR NEGATIVE 04/01/2019 1141   PROTEINUR 30 (A) 04/01/2019 1141   UROBILINOGEN 0.2 08/17/2018 0940   NITRITE NEGATIVE 04/01/2019 1141   LEUKOCYTESUR SMALL (A) 04/01/2019 1141   Radiological Exams on Admission: EEG  Result Date: 04/01/2019 Lora Havens, MD     04/01/2019  2:41 PM Patient Name:  Soni Kegel MRN: [id number] Epilepsy Attending: Lora Havens Referring Physician/Provider: Dr. Karena Addison Aroor Date: 04/01/2019 Duration: 22.27 minutes Patient history: 79 year old female with past medical history of "brain bleed in 2015" and seizures who presented after multiple witnessed generalized tonic-clonic seizure-like episode at home.  EEG to evaluate for seizures. Level of alertness: awake AEDs during EEG study: keppra Technical aspects: This EEG study was done with scalp electrodes positioned according to the 10-20 International system of electrode placement. Electrical activity was acquired at a sampling rate of '500Hz'  and reviewed with a high frequency filter of '70Hz'  and a low frequency filter of '1Hz' . EEG data were recorded continuously and digitally stored. DESCRIPTION: EEG showed continuous generalized 2 to 3 Hz delta activity in left hemisphere.  EEG also  showed 8 Hz alpha activity in the right hemisphere as well as intermittent generalized 2 to 3 Hz delta activity. No clear posterior dominant rhythm was seen.  Hyperventilation and photic stimulation were not performed. Abnormality -Continuous slow, left hemisphere -Intermittent slow, generalized IMPRESSION: This study is suggestive of cortical dysfunction in the left hemisphere which is nonspecific to etiology but could be secondary to postictal state.  Additionally, there is evidence of mild diffuse encephalopathy.No seizures or epileptiform discharges were seen throughout the recording. Lora Havens   CT Head Wo Contrast  Result Date: 04/01/2019 CLINICAL DATA:  Encephalopathy and seizure.  Abnormal neuro exam. EXAM: CT HEAD WITHOUT CONTRAST TECHNIQUE: Contiguous axial images were obtained from the base of the skull through the vertex without intravenous contrast. COMPARISON:  None. FINDINGS: Brain: No evidence of acute infarction, hemorrhage, hydrocephalus, extra-axial collection or mass lesion/mass effect. Moderate low-density in  the cerebral white matter attributed to chronic small vessel ischemia. There is cortical atrophy that is generalized. No focal cortical finding to explain seizure history. Vascular: No hyperdense vessel or unexpected calcification. Skull: No acute or aggressive finding Sinuses/Orbits: Mucosal thickening in the right nasal cavity where there is concha bullosa, incompletely covered. Negative orbits IMPRESSION: 1. No acute or reversible finding. 2. Atrophy and moderate chronic small vessel ischemia. Electronically Signed   By: Monte Fantasia M.D.   On: 04/01/2019 10:23   DG Chest Portable 1 View  Result Date: 04/01/2019 CLINICAL DATA:  Seizures EXAM: PORTABLE CHEST 1 VIEW COMPARISON:  None. FINDINGS: Mild pulmonary edema. No pleural effusion or pneumothorax. Top-normal heart size. IMPRESSION: Mild pulmonary edema. Electronically Signed   By: Macy Mis M.D.   On: 04/01/2019 11:05    EKG: Independently reviewed. Sinus rhythm 90's  Assessment/Plan Principal Problem:   Seizure disorder as sequela of cerebrovascular accident [facility]) Active Problems:   ICH (intracerebral hemorrhage) ([facility])   NSTEMI (non-ST elevated myocardial infarction) ([place])   Cerebrovascular accident (CVA) ([place])   Type II diabetes mellitus with complication ([place])   Hyperlipidemia associated with type 2 diabetes mellitus ([place])   B12 deficiency   Hypertension   Problem Assessment/Plan  Encephalopathy / Seizure disorder as sequela of cerebrovascular accident [facility]) Assessment & Plan Home po keppra held and we will give iv until pt is alert and responsive.  Seizure/aspiration/fall precaution. Prn lorazepam for breakthrough seizure activity. Neurology consulted.   Nstemi Assessment & Plan     We will cycle troponin and consult cardiology. Pt has h/o ICH so I have not started heparin drip.  Once pt is alert we will start her home regimen. Consider prn metoprolol iv and or vasotec if troponin goes  up.  Hypertension Assessment & Plan Iv hydralazine every 6 h for htn   Cerebrovascular accident (CVA) ([place]) Assessment & Plan H/O cva we will change bp meds to iv as pt is somnolent and nonverbal .  Hyperlipidemia associated with type 2 diabetes mellitus ([place]) Assessment & Plan home pravastatin once pt is alert and able to follow commands.  Pt is npo swallow screen once pt is awake.   Type II diabetes mellitus with complication [facility]) Assessment & Plan accucheck  q4hours with SSI coverage.   B12 deficiency Assessment & Plan B12/folate check.  Hematuria: Assessment & Plan  RBC /Blood in urine, Empiric abx started. urine culture collected.    DVT prophylaxis: Heparin Code Status: Full Family Communication: None Disposition Plan: Home Consults called: Neurology Dr.Aroor Admission status: Inpateint Para Skeans MD [facility] If 7PM-7AM, please  contact night-coverage [email or web address] Password [facility]  04/01/2019, 6:02 PM

## 2019-04-01 NOTE — ED Notes (Addendum)
Transporting pt to room at this time [id number]

## 2019-04-01 NOTE — Assessment & Plan Note (Signed)
Iv hydralazine every 6 h for htn

## 2019-04-01 NOTE — ED Notes (Signed)
Patient husband leaving at this time and taking patient's belongings.

## 2019-04-01 NOTE — ED Notes (Signed)
SpO2 99%, [place] titrated to 2L

## 2019-04-01 NOTE — Assessment & Plan Note (Signed)
accucheck  q4hours with SSI coverage.

## 2019-04-01 NOTE — ED Notes (Addendum)
In and out performed with [facility] NT. Pt changed and peri care provided

## 2019-04-01 NOTE — Assessment & Plan Note (Signed)
H/O cva we will change bp meds to iv as pt is somnolent and nonverbal .

## 2019-04-01 NOTE — Procedures (Signed)
Patient Name: [name]  MRN: [id number]  Epilepsy Attending: Charlsie Quest  Referring Physician/Provider: Dr. Georgiana Spinner Aroor Date: 04/01/2019 Duration: 22.27 minutes  Patient history: 79 year old female with past medical history of "brain bleed in 2015" and seizures who presented after multiple witnessed generalized tonic-clonic seizure-like episode at home.  EEG to evaluate for seizures.  Level of alertness: awake  AEDs during EEG study: keppra  Technical aspects: This EEG study was done with scalp electrodes positioned according to the 10-20 International system of electrode placement. Electrical activity was acquired at a sampling rate of 500Hz  and reviewed with a high frequency filter of 70Hz  and a low frequency filter of 1Hz . EEG data were recorded continuously and digitally stored.   DESCRIPTION: EEG showed continuous generalized 2 to 3 Hz delta activity in left hemisphere.  EEG also showed 8 Hz alpha activity in the right hemisphere as well as intermittent generalized 2 to 3 Hz delta activity. No clear posterior dominant rhythm was seen.  Hyperventilation and photic stimulation were not performed.  Abnormality -Continuous slow, left hemisphere -Intermittent slow, generalized  IMPRESSION: This study is suggestive of cortical dysfunction in the left hemisphere which is nonspecific to etiology but could be secondary to postictal state.  Additionally, there is evidence of mild diffuse encephalopathy.No seizures or epileptiform discharges were seen throughout the recording.     Manolo Bosket 

## 2019-04-01 NOTE — Assessment & Plan Note (Signed)
Home po keppra held and we will give iv until pt is alert and responsive.  Seizure/aspiration/fall precaution.

## 2019-04-01 NOTE — Assessment & Plan Note (Signed)
home pravastatin once pt is alert and able to follow commands.  Pt is npo swallow screen once pt is awake.

## 2019-04-01 NOTE — Progress Notes (Signed)
eeg completed ° °

## 2019-04-01 NOTE — ED Notes (Signed)
Pt 100% on 2L [place]. Placed on RA

## 2019-04-01 NOTE — ED Triage Notes (Signed)
Arrived by EMS from home for seizure/unresponsive. HX seizures/ takes keppra. Husband reports LKW last night. EMS administered 3mg  versed total. Patient was 75% RA sats upon arrival, placed on non re breather. EMS vitals 81HR, 152/87 b/p

## 2019-04-01 NOTE — Consult Note (Signed)
Requesting Physician: Dr. Shaune Pollack    Reason for consult: Seizures/status epilepticus  History obtained from: Patient and Chart    HPI:                                                                                                                                       Kynlee Koenigsberg is a 79 y.o. female with past medical history of " brain bleed in 2015", history of seizures, diabetes mellitus, hypertension, hyperlipidemia, peripheral vascular disease, cellulitis presents to [facility] emergency department after having witnessed multiple seizures at home, described as generalized shaking.  On arrival, patient is unresponsive placed on non-rebreather as room saturation 75%.  Patient  Noted had right-sided weakness.  Patient was loaded with 1 g of Keppra in the emergency department.  CT head was unremarkable for acute findings.  Electrolytes are within normal limits.  On assessment, patient awake and not following commands. Does have purposeful movements and no gaze deviation.  She was started on Keppra after being admitted at [facility] on December 18, 2018 with seizures.  Regarding history of brain bleed, location unclear - no documents available.     Past Medical History:  Diagnosis Date  . Cellulitis of left foot 05/23/2016  . Diabetes mellitus without complication ([facility])   . Diabetic ulcer of left foot ([facility]) 05/23/2016  . Hyperlipidemia   . Hypertension   . Peripheral vascular disease ([facility])   . Vitamin D deficiency 05/23/2016    History reviewed. No pertinent surgical history.  Family History  Problem Relation Age of Onset  . Healthy Mother   . Healthy Father    Social History:  reports that she quit smoking about 12 years ago. Her smoking use included cigarettes. She has a 12.50 pack-year smoking history. She has never used smokeless tobacco. She reports previous alcohol use. She reports that she does not use drugs.  Allergies: No Known Allergies  Medications:                                                                                                                         I reviewed home medications   ROS:  Unable to review systems due to altered mental status    Examination:                                                                                                      General: Appears well-developed and well-nourished.  Psych: Affect appropriate to situation Eyes: No scleral injection HENT: No OP obstrucion Head: Normocephalic.  Cardiovascular: Normal rate and regular rhythm.  Respiratory: Effort normal and breath sounds normal to anterior ascultation GI: Soft.  No distension. There is no tenderness.  Skin: WDI    Neurological Examination Mental Status: Somnolent, but arousable.  Does not follow any commands.  Nonverbal.  Appears agitated Cranial Nerves: II: Visual fields : Blinks to threat on both sides III,IV, VI: ptosis not present, no forced gaze deviation, pupils equal, round, reactive to light and accommodation VII: No obvious facial droop VIII: hearing normal bilaterally IX,X: uvula rises symmetrically XI: bilateral shoulder shrug XII: midline tongue extension Motor: LUE sustains antigravity. RUE: 2/5 strength, withdraws to noxious stimulus.  LLE: Bilaterally moves against gravity Tone and bulk:normal tone throughout; no atrophy noted Sensory: Withdraws to noxious stimulus bilaterally  Deep Tendon Reflexes: 2+ and symmetric throughout Plantars: Right: extensor   Left: downgoing Cerebellar: Unable to assess      Lab Results: Basic Metabolic Panel: Recent Labs  Lab 04/01/19 0954  NA 138  K 5.0  CL 105  CO2 27  GLUCOSE 197*  BUN 17  CREATININE 1.12*  CALCIUM 9.3  MG 1.9    CBC: Recent Labs  Lab 04/01/19 0954  WBC 5.3  NEUTROABS 3.5  HGB 11.6*  HCT 36.9  MCV 90.9  PLT  190    Coagulation Studies: No results for input(s): LABPROT, INR in the last 72 hours.  Imaging: CT Head Wo Contrast  Result Date: 04/01/2019 CLINICAL DATA:  Encephalopathy and seizure.  Abnormal neuro exam. EXAM: CT HEAD WITHOUT CONTRAST TECHNIQUE: Contiguous axial images were obtained from the base of the skull through the vertex without intravenous contrast. COMPARISON:  None. FINDINGS: Brain: No evidence of acute infarction, hemorrhage, hydrocephalus, extra-axial collection or mass lesion/mass effect. Moderate low-density in the cerebral white matter attributed to chronic small vessel ischemia. There is cortical atrophy that is generalized. No focal cortical finding to explain seizure history. Vascular: No hyperdense vessel or unexpected calcification. Skull: No acute or aggressive finding Sinuses/Orbits: Mucosal thickening in the right nasal cavity where there is concha bullosa, incompletely covered. Negative orbits IMPRESSION: 1. No acute or reversible finding. 2. Atrophy and moderate chronic small vessel ischemia. Electronically Signed   By: Marnee Spring M.D.   On: 04/01/2019 10:23   DG Chest Portable 1 View  Result Date: 04/01/2019 CLINICAL DATA:  Seizures EXAM: PORTABLE CHEST 1 VIEW COMPARISON:  None. FINDINGS: Mild pulmonary edema. No pleural effusion or pneumothorax. Top-normal heart size. IMPRESSION: Mild pulmonary edema. Electronically Signed   By: Guadlupe Spanish M.D.   On: 04/01/2019 11:05     I have reviewed the above imaging : CT Head   ASSESSMENT AND PLAN  70-year female with history of seizures,  ICH presents to the ED with multiple witnessed seizures.  Currently appears postictal, low suspicion for ongoing partial status epilepticus.  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     Katrina Brosh [facility] Pager Number [phone]

## 2019-04-01 NOTE — Consult Note (Signed)
No major DDI with antiepileptic medications  Paschal Dopp, PharmD, BCPS

## 2019-04-01 NOTE — ED Notes (Signed)
Pt trying to get out of bed, redirected. 

## 2019-04-01 NOTE — ED Notes (Signed)
Pt 95% on RA

## 2019-04-01 NOTE — ED Notes (Signed)
EEG at bedside.

## 2019-04-01 NOTE — ED Provider Notes (Signed)
[facility] Emergency Department Provider Note  ____________________________________________   None    (approximate)  I have reviewed the triage vital signs and the nursing notes.   HISTORY  Chief Complaint Altered Mental Status and Seizures    HPI [name] is a 79 y.o. female with past medical history as below including hypertension, hyperlipidemia, history of recent right cerebellar CVA, here with seizure activity.  Patient has a known history of seizure as well.  Per report, she was last seen normal yesterday.  Husband awoke to find her seizing and altered today.  On EMS arrival, the patient was having active, generalized, tonic-clonic seizure activity.  She was given 2 of Versed followed by 1 of Versed with eventual improvement in seizure-like activity.  She arrives postictal and confused, sonorous.  She does seem to respond to painful stimuli.  Remainder of history limited due to altered mental status.      Level 5 caveat invoked as remainder of history, ROS, and physical exam limited due to patient's AMS.     Past Medical History:  Diagnosis Date  . Cellulitis of left foot 05/23/2016  . Diabetes mellitus without complication ([place])   . Diabetic ulcer of left foot ([place]) 05/23/2016  . Hyperlipidemia   . Hypertension   . Peripheral vascular disease ([place])   . Vitamin D deficiency 05/23/2016    Patient Active Problem List   Diagnosis Date Noted  . Seizures ([place]) 04/01/2019  . Cerebrovascular accident (CVA) ([place]) 12/31/2018  . Seizure disorder as sequela of cerebrovascular accident ([place]) 12/31/2018  . Peripheral vascular disease with stasis dermatitis 04/27/2018  . 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 Hypertension 08/18/2016  . Microalbuminuria 05/24/2016  . Type II diabetes mellitus with complication ([place]) 16/11/9602  . Hyperlipidemia associated with type 2 diabetes mellitus ([place]) 05/23/2016  . B12 deficiency 05/23/2016  . Short-term  memory loss 05/23/2016  . Overweight (BMI 25.0-29.9) 05/23/2016  . Edema of right lower extremity 05/23/2016  . ICH (intracerebral hemorrhage) ([place]) 02/07/2013    History reviewed. No pertinent surgical history.  Prior to Admission medications   Medication Sig Start Date End Date Taking? Authorizing Provider  Blood Glucose Monitoring Suppl (ACCU-CHEK GUIDE ME) w/Device KIT USE UP TO FOUR TIMES DAILY AS DIRECTED 09/03/18  Yes Glean Hess, MD  glucose blood test strip Use per insurance brand. Diagnosis:Diabetes Mellitus 2 Uncontrolled without Complications = V40. 65. Check Blood Sugar up to four times daily. 08/23/16  Yes Glean Hess, MD  Lancets MISC Use per insurance brand. To use with Glucometer to monitor sugar up to four times daily. 08/19/16  Yes Plonk, Gwyndolyn Saxon, MD  levETIRAcetam (KEPPRA) 1000 MG tablet Take 1,000 mg by mouth 2 (two) times daily. 12/18/18  Yes [provider]  lisinopril-hydrochlorothiazide (ZESTORETIC) 10-12.5 MG tablet Take 1 tablet by mouth daily. 08/17/18  Yes Glean Hess, MD  Multiple Vitamin (MULTIVITAMIN) capsule Take 1 capsule by mouth daily.   Yes [provider]  pravastatin (PRAVACHOL) 40 MG tablet Take 1 tablet (40 mg total) by mouth daily. 08/17/18  Yes Glean Hess, MD  sitaGLIPtin-metformin (JANUMET) 50-500 MG tablet Take 1 tablet by mouth 2 (two) times daily with a meal. 08/17/18  Yes Glean Hess, MD  Vitamin D, Cholecalciferol, 25 MCG (1000 UT) CAPS Take 1 capsule by mouth daily.   Yes [provider]    Allergies Patient has no known allergies.  Family History  Problem Relation Age of Onset  . Healthy Mother   .  Healthy Father     Social History Social History   Tobacco Use  . Smoking status: Former Smoker    Packs/day: 0.25    Years: 50.00    Pack years: 12.50    Types: Cigarettes    Quit date: 2009    Years since quitting: 12.1  . Smokeless tobacco: Never Used  . Tobacco comment:  smoking cessation materials not required  Substance Use Topics  . Alcohol use: Not Currently  . Drug use: Never    Review of Systems  Review of Systems  Unable to perform ROS: Mental status change     ____________________________________________  PHYSICAL EXAM:      VITAL SIGNS: ED Triage Vitals  Enc Vitals Group     BP 04/01/19 0928 (!) 166/92     Pulse Rate 04/01/19 0928 85     Resp 04/01/19 0928 15     Temp --      Temp src --      SpO2 04/01/19 0928 100 %     Weight 04/01/19 0923 180 lb (81.6 kg)     Height 04/01/19 0923 _0  (1.727 m)     Head Circumference --      Peak Flow --      Pain Score --      Pain Loc --      Pain Edu? --      Excl. in [place]? --      Physical Exam Vitals and nursing note reviewed.  Constitutional:      General: She is in acute distress.     Appearance: She is well-developed.     Comments: Disoriented, sonorous respirations  HENT:     Head: Normocephalic and atraumatic.     Comments: Blood noted in posterior pharynx, suctioned wtihout evidence of active bleeding. Eyes:     Conjunctiva/sclera: Conjunctivae normal.  Cardiovascular:     Rate and Rhythm: Regular rhythm. Tachycardia present.     Heart sounds: Normal heart sounds. No murmur. No friction rub.  Pulmonary:     Effort: No respiratory distress.     Breath sounds: No wheezing or rales.     Comments: Sonorous respirations with transmitted upper airway sounds, mild tachypnea, breath sounds symmetric Abdominal:     General: There is no distension.     Palpations: Abdomen is soft.  Musculoskeletal:     Cervical back: Neck supple.  Skin:    General: Skin is warm.     Capillary Refill: Capillary refill takes less than 2 seconds.  Neurological:     Motor: No abnormal muscle tone.     Comments: Sonorous respirations. Does not blink to threat, but winces to pain. Subtle R facial droop. RUE not noted to be moving or withdrawing to pain, mild withdrawal of RLE, and brisk response  and spontaneous movement noted throughout LLE and LUE. No seizure like activity.       ____________________________________________   LABS (all labs ordered are listed, but only abnormal results are displayed)  Labs Reviewed  GLUCOSE, CAPILLARY - Abnormal; Notable for the following components:      Result Value   Glucose-Capillary 151 (*)    All other components within normal limits  CBC WITH DIFFERENTIAL/PLATELET - Abnormal; Notable for the following components:   Hemoglobin 11.6 (*)    All other components within normal limits  COMPREHENSIVE METABOLIC PANEL - Abnormal; Notable for the following components:   Glucose, Bld 197 (*)    Creatinine, Ser 1.12 (*)  GFR calc non Af Amer 47 (*)    GFR calc Af Amer 54 (*)    All other components within normal limits  URINALYSIS, COMPLETE (UACMP) WITH MICROSCOPIC - Abnormal; Notable for the following components:   Color, Urine YELLOW (*)    APPearance CLEAR (*)    Hgb urine dipstick MODERATE (*)    Protein, ur 30 (*)    Leukocytes,Ua SMALL (*)    RBC / HPF >50 (*)    All other components within normal limits  TROPONIN I (HIGH SENSITIVITY) - Abnormal; Notable for the following components:   Troponin I (High Sensitivity) 137 (*)    All other components within normal limits  RESPIRATORY PANEL BY RT PCR (FLU A&B, COVID)  URINE CULTURE  MAGNESIUM  TSH  T4, FREE  HEPATIC FUNCTION PANEL  BRAIN NATRIURETIC PEPTIDE  CK  VITAMIN B12  HEMOGLOBIN A1C  FIBRIN DERIVATIVES D-DIMER ([facility] ONLY)  CBC WITH DIFFERENTIAL/PLATELET  COMPREHENSIVE METABOLIC PANEL  CBG MONITORING, ED  TROPONIN I (HIGH SENSITIVITY)    ____________________________________________  EKG: Normal sinus rhythm, VR 90. PR 179, QRS 108, QTc 453. No ST elevations or depressions.  ________________________________________  RADIOLOGY All imaging, including plain films, CT scans, and ultrasounds, independently reviewed by me, and interpretations confirmed via formal  radiology reads.  ED MD interpretation:   CT Head: [place]  Official radiology report(s): EEG  Result Date: 04/01/2019 Lora Havens, MD     04/01/2019  2:41 PM Patient Name: [name] MRN: [id number] Epilepsy Attending: Lora Havens Referring Physician/Provider: Dr. Karena Addison Aroor Date: 04/01/2019 Duration: 22.27 minutes Patient history: 79 year old female with past medical history of "brain bleed in 2015" and seizures who presented after multiple witnessed generalized tonic-clonic seizure-like episode at home.  EEG to evaluate for seizures. Level of alertness: awake AEDs during EEG study: keppra Technical aspects: This EEG study was done with scalp electrodes positioned according to the 10-20 International system of electrode placement. Electrical activity was acquired at a sampling rate of _0  and reviewed with a high frequency filter of _1  and a low frequency filter of _2 . EEG data were recorded continuously and digitally stored. DESCRIPTION: EEG showed continuous generalized 2 to 3 Hz delta activity in left hemisphere.  EEG also showed 8 Hz alpha activity in the right hemisphere as well as intermittent generalized 2 to 3 Hz delta activity. No clear posterior dominant rhythm was seen.  Hyperventilation and photic stimulation were not performed. Abnormality -Continuous slow, left hemisphere -Intermittent slow, generalized IMPRESSION: This study is suggestive of cortical dysfunction in the left hemisphere which is nonspecific to etiology but could be secondary to postictal state.  Additionally, there is evidence of mild diffuse encephalopathy.No seizures or epileptiform discharges were seen throughout the recording. Lora Havens   CT Head Wo Contrast  Result Date: 04/01/2019 CLINICAL DATA:  Encephalopathy and seizure.  Abnormal neuro exam. EXAM: CT HEAD WITHOUT CONTRAST TECHNIQUE: Contiguous axial images were obtained from the base of the skull through the vertex without  intravenous contrast. COMPARISON:  None. FINDINGS: Brain: No evidence of acute infarction, hemorrhage, hydrocephalus, extra-axial collection or mass lesion/mass effect. Moderate low-density in the cerebral white matter attributed to chronic small vessel ischemia. There is cortical atrophy that is generalized. No focal cortical finding to explain seizure history. Vascular: No hyperdense vessel or unexpected calcification. Skull: No acute or aggressive finding Sinuses/Orbits: Mucosal thickening in the right nasal cavity where there is concha bullosa, incompletely covered. Negative orbits IMPRESSION: 1. No acute or reversible finding.  2. Atrophy and moderate chronic small vessel ischemia. Electronically Signed   By: Monte Fantasia M.D.   On: 04/01/2019 10:23   DG Chest Portable 1 View  Result Date: 04/01/2019 CLINICAL DATA:  Seizures EXAM: PORTABLE CHEST 1 VIEW COMPARISON:  None. FINDINGS: Mild pulmonary edema. No pleural effusion or pneumothorax. Top-normal heart size. IMPRESSION: Mild pulmonary edema. Electronically Signed   By: Macy Mis M.D.   On: 04/01/2019 11:05    ____________________________________________  PROCEDURES   Procedure(s) performed (including Critical Care):  .Critical Care Performed by: Duffy Bruce, MD Authorized by: Duffy Bruce, MD   Critical care provider statement:    Critical care time (minutes):  45   Critical care time was exclusive of:  Separately billable procedures and treating other patients and teaching time   Critical care was necessary to treat or prevent imminent or life-threatening deterioration of the following conditions:  Cardiac failure, circulatory failure, respiratory failure, metabolic crisis and CNS failure or compromise   Critical care was time spent personally by me on the following activities:  Development of treatment plan with patient or surrogate, discussions with consultants, evaluation of patient's response to treatment, examination  of patient, obtaining history from patient or surrogate, ordering and performing treatments and interventions, ordering and review of laboratory studies, ordering and review of radiographic studies, pulse oximetry, re-evaluation of patient's condition and review of old charts   I assumed direction of critical care for this patient from another provider in my specialty: no      ____________________________________________  INITIAL IMPRESSION / MDM / [place] / ED COURSE  As part of my medical decision making, I reviewed the following data within the [place] notes reviewed and incorporated, Old chart reviewed, Notes from prior ED visits, and [place] Controlled Substance Database       *Jakylah Bassinger was evaluated in Emergency Department on 04/01/2019 for the symptoms described in the history of present illness. She was evaluated in the context of the global COVID-19 pandemic, which necessitated consideration that the patient might be at risk for infection with the SARS-CoV-2 virus that causes COVID-19. Institutional protocols and algorithms that pertain to the evaluation of patients at risk for COVID-19 are in a state of rapid change based on information released by regulatory bodies including the CDC and federal and state organizations. These policies and algorithms were followed during the patient's care in the ED.  Some ED evaluations and interventions may be delayed as a result of limited staffing during the pandemic.*     Medical Decision Making:  79 yo F here with witnessed GTC seizure activity. Pt post-ictal, but protecting airway on arrival. Possible tongue biting noted but no active bleeding. Pt loaded with 2 g Keppra and broad labs, CT Head obtained. Labs obtained and are overall fairly unremarkable. Lytes acceptable. CBC without significant leukocytosis. No signs of acute infection. CXR is clear. CT Head is negative. Pt given keppra, ativan as well for  agitation in post ictal state.  Unclear trigger. Pt has known epilepsy with h/o CVA, so likely related to underlying old infarct. UA is pending. Dr. Lorraine Lax of neurology consulted and has evaluated pt. Will admit for monitoring, routine EEG, work-up. Husband updated.  ____________________________________________  FINAL CLINICAL IMPRESSION(S) / ED DIAGNOSES  Final diagnoses:  Status epilepticus ([place])     MEDICATIONS GIVEN DURING THIS VISIT:  Medications  levETIRAcetam (KEPPRA) IVPB 1000 mg/100 mL premix (has no administration in time range)  cefTRIAXone (ROCEPHIN) 1 g  in sodium chloride 0.9 % 100 mL IVPB (has no administration in time range)  0.9 %  sodium chloride infusion (has no administration in time range)  heparin injection 5,000 Units (has no administration in time range)  LORazepam (ATIVAN) injection 1-2 mg (has no administration in time range)  thiamine (B-1) injection 100 mg (has no administration in time range)  insulin aspart (novoLOG) injection 0-15 Units (has no administration in time range)  hydrALAZINE (APRESOLINE) injection 10 mg (has no administration in time range)  levETIRAcetam (KEPPRA) IVPB 1000 mg/100 mL premix (0 mg Intravenous Stopped 04/01/19 1003)  0.9 %  sodium chloride infusion ( Intravenous New Bag/Given 04/01/19 1037)  sodium chloride 0.9 % bolus 500 mL (0 mLs Intravenous Stopped 04/01/19 1037)  LORazepam (ATIVAN) injection 1 mg (1 mg Intravenous Given 04/01/19 1034)  levETIRAcetam (KEPPRA) IVPB 1000 mg/100 mL premix (0 mg Intravenous Stopped 04/01/19 1220)     ED Discharge Orders    None       Note:  This document was prepared using Dragon voice recognition software and may include unintentional dictation errors.   Duffy Bruce, MD 04/01/19 [phone]

## 2019-04-01 NOTE — ED Notes (Signed)
Patient has sitter at bedside due to trying to pull off cords, taking clothes off, trying to climb out of bed, and tugging at IV.

## 2019-04-01 NOTE — ED Notes (Signed)
HUSBAND AT BEDSIDE

## 2019-04-01 NOTE — Assessment & Plan Note (Signed)
D/d include NSTEMI , cardiology consulted. To start po statin therapy once pt is alert.  We will consult cardiology [facility]

## 2019-04-02 ENCOUNTER — Inpatient Hospital Stay (HOSPITAL_COMMUNITY)
Admit: 2019-04-02 | Discharge: 2019-04-02 | Disposition: A | Discharge status: Home / Self Care | Payer: Medicare HMO | Attending: Internal Medicine | Admitting: Internal Medicine

## 2019-04-02 ENCOUNTER — Inpatient Hospital Stay: Payer: Medicare HMO

## 2019-04-02 DIAGNOSIS — E785 Hyperlipidemia, unspecified: Secondary | ICD-10-CM

## 2019-04-02 DIAGNOSIS — G40909 Epilepsy, unspecified, not intractable, without status epilepticus: Secondary | ICD-10-CM

## 2019-04-02 DIAGNOSIS — I69398 Other sequelae of cerebral infarction: Principal | ICD-10-CM

## 2019-04-02 DIAGNOSIS — I361 Nonrheumatic tricuspid (valve) insufficiency: Secondary | ICD-10-CM

## 2019-04-02 DIAGNOSIS — R778 Other specified abnormalities of plasma proteins: Secondary | ICD-10-CM

## 2019-04-02 DIAGNOSIS — I340 Nonrheumatic mitral (valve) insufficiency: Secondary | ICD-10-CM

## 2019-04-02 DIAGNOSIS — E1169 Type 2 diabetes mellitus with other specified complication: Secondary | ICD-10-CM

## 2019-04-02 LAB — COMPREHENSIVE METABOLIC PANEL
ALT: 22 U/L (ref 0–44)
AST: 20 U/L (ref 15–41)
Albumin: 3.5 g/dL (ref 3.5–5.0)
Alkaline Phosphatase: 60 U/L (ref 38–126)
Anion gap: 10 (ref 5–15)
BUN: 15 mg/dL (ref 8–23)
CO2: 25 mmol/L (ref 22–32)
Calcium: 9.3 mg/dL (ref 8.9–10.3)
Chloride: 104 mmol/L (ref 98–111)
Creatinine, Ser: 0.86 mg/dL (ref 0.44–1.00)
GFR calc Af Amer: >60 mL/min (ref >60)
GFR calc non Af Amer: >60 mL/min (ref >60)
Glucose, Bld: 134 mg/dL — ABNORMAL HIGH (ref 70–99)
Potassium: 4.3 mmol/L (ref 3.5–5.1)
Sodium: 139 mmol/L (ref 135–145)
Total Bilirubin: 0.5 mg/dL (ref 0.3–1.2)
Total Protein: 6.9 g/dL (ref 6.5–8.1)

## 2019-04-02 LAB — GLUCOSE, CAPILLARY
Glucose-Capillary: 121 mg/dL — ABNORMAL HIGH (ref 70–99)
Glucose-Capillary: 125 mg/dL — ABNORMAL HIGH (ref 70–99)
Glucose-Capillary: 130 mg/dL — ABNORMAL HIGH (ref 70–99)
Glucose-Capillary: 148 mg/dL — ABNORMAL HIGH (ref 70–99)
Glucose-Capillary: 174 mg/dL — ABNORMAL HIGH (ref 70–99)
Glucose-Capillary: 130 mg/dL — ABNORMAL HIGH (ref 70–99)

## 2019-04-02 LAB — CBC WITH DIFFERENTIAL/PLATELET
Abs Immature Granulocytes: 0.03 10*3/uL (ref 0.00–0.07)
Basophils Absolute: 0 10*3/uL (ref 0.0–0.1)
Basophils Relative: 0 %
Eosinophils Absolute: 0.2 10*3/uL (ref 0.0–0.5)
Eosinophils Relative: 2 %
HCT: 36.2 % (ref 36.0–46.0)
Hemoglobin: 12 g/dL (ref 12.0–15.0)
Immature Granulocytes: 0 %
Lymphocytes Relative: 27 %
Lymphs Abs: 2.8 10*3/uL (ref 0.7–4.0)
MCH: 29.5 pg (ref 26.0–34.0)
MCHC: 33.1 g/dL (ref 30.0–36.0)
MCV: 88.9 fL (ref 80.0–100.0)
Monocytes Absolute: 0.8 10*3/uL (ref 0.1–1.0)
Monocytes Relative: 7 %
Neutro Abs: 6.4 10*3/uL (ref 1.7–7.7)
Neutrophils Relative %: 64 %
Platelets: 192 10*3/uL (ref 150–400)
RBC: 4.07 MIL/uL (ref 3.87–5.11)
RDW: 14.1 % (ref 11.5–15.5)
WBC: 10.2 10*3/uL (ref 4.0–10.5)
nRBC: 0 % (ref 0.0–0.2)

## 2019-04-02 LAB — TROPONIN I (HIGH SENSITIVITY): Troponin I (High Sensitivity): 142 ng/L (ref <18)

## 2019-04-02 MED ORDER — LISINOPRIL-HYDROCHLOROTHIAZIDE 10-12.5 MG PO TABS: 1.0000 | ORAL_TABLET | Freq: Every day | ORAL | Status: DC

## 2019-04-02 MED ORDER — CHLORHEXIDINE GLUCONATE CLOTH 2 % EX PADS
6.0000 | MEDICATED_PAD | Freq: Every day | CUTANEOUS | Status: DC
  Administered 2019-04-02: 10:00:00 6 via TOPICAL

## 2019-04-02 MED ORDER — INSULIN ASPART 100 UNIT/ML ~~LOC~~ SOLN
0.0000 [IU] | Freq: Three times a day (TID) | SUBCUTANEOUS | Status: DC
  Administered 2019-04-03: 13:00:00 3 [IU] via SUBCUTANEOUS
  Administered 2019-04-03: 10:00:00 2 [IU] via SUBCUTANEOUS
  Filled 2019-04-02 (×2): qty 1

## 2019-04-02 MED ORDER — SODIUM CHLORIDE 0.9% FLUSH
10.0000 mL | Freq: Two times a day (BID) | INTRAVENOUS | Status: DC
  Administered 2019-04-02 – 2019-04-03 (×2): 10 mL

## 2019-04-02 MED ORDER — INSULIN ASPART 100 UNIT/ML ~~LOC~~ SOLN: 0.0000 [IU] | Freq: Every day | SUBCUTANEOUS | Status: DC

## 2019-04-02 MED ORDER — PRAVASTATIN SODIUM 20 MG PO TABS
40.0000 mg | ORAL_TABLET | Freq: Every day | ORAL | Status: DC
  Administered 2019-04-02 – 2019-04-03 (×2): 40 mg via ORAL
  Filled 2019-04-02 (×2): qty 2

## 2019-04-02 MED ORDER — HYDROCHLOROTHIAZIDE 12.5 MG PO CAPS
12.5000 mg | ORAL_CAPSULE | Freq: Every day | ORAL | Status: DC
  Administered 2019-04-02 – 2019-04-03 (×2): 12.5 mg via ORAL
  Filled 2019-04-02 (×2): qty 1

## 2019-04-02 MED ORDER — LISINOPRIL 10 MG PO TABS
10.0000 mg | ORAL_TABLET | Freq: Every day | ORAL | Status: DC
  Administered 2019-04-02 – 2019-04-03 (×2): 10 mg via ORAL
  Filled 2019-04-02 (×3): qty 1

## 2019-04-02 NOTE — Progress Notes (Addendum)
PROGRESS NOTE                                                                                                                                                                                                             Patient Demographics:    [name], is a 79 y.o. female, DOB - 01/25/1941, RAQ:[id number]  Admit date - 04/01/2019   Admitting Physician Gertha Calkin, MD  Outpatient Primary MD for the patient is Sharilyn Sites, MD  LOS - 1    Chief Complaint  Patient presents with  . Altered Mental Status  . Seizures       Brief Narrative 79 year old female with history of hypertension, hyperlipidemia, history of ICH with CVA and seizure disorder (on Keppra started in November 2020 at [facility] after she was admitted for seizures.), peripheral vascular disease and diabetes mellitus who presented to the ED with acute seizures and encephalopathy.  She received Versed by EMS in route.  Patient was encephalopathic and nonverbal upon presentation and placed on nonrebreather..  Head CT was unremarkable for acute findings.  Given loading dose of IV Keppra and started on IV Keppra.  Admitted for further management.  Neurology following.   Subjective:   Patient is awake and alert oriented this morning.  Denies any headache, dizziness.  Feels weak.   Assessment  & Plan :    Principal Problem: Acute witnessed seizures. Postictal on presentation with encephalopathy.  Now resolved.  Given loading dose of Keppra followed by IV Keppra twice daily.  Head CT negative for acute findings.  MRI brain without contrast ordered per neurology recommendation, no acute findings or seizure focus..  Further dosing of Keppra per neurology. EEG shows mild diffuse encephalopathy and possible postictal changes but no  Seizure activity.   Active Problems:   Type II diabetes mellitus with complication (HCC) CBG stable.  Monitor on sliding scale  coverage.  Urinary tract infection On empiric Rocephin.  Follow cultures.  Essential hypertension Resume home blood pressure meds.  (HCTZ-losartan).  History of CVA/ICH Continue statin.  Elevated troponin Peaked at 147.  No chest pain symptoms.  Suspect demand ischemia.  Stable on telemetry.  Follow 2D echo.  Cardiology consulted and no further recommendations.  Code Status : Full code  Family Communication  : husband updated on the phone  Disposition Plan  :  Home possibly tomorrow if seizure-free, further adjustment of Keppra dose and MRI brain.  Barriers For Discharge : Postictal state, UTI and inpatient monitoring for seizures.  Consults  : Neurology  Procedures  : EEG/MRI brain, CT head  DVT Prophylaxis  :  Lovenox -   Lab Results  Component Value Date   PLT 192 04/02/2019    Antibiotics  :   Anti-infectives (From admission, onward)   Start     Dose/Rate Route Frequency Ordered Stop   04/01/19 1600  cefTRIAXone (ROCEPHIN) 1 g in sodium chloride 0.9 % 100 mL IVPB     1 g 200 mL/hr over 30 Minutes Intravenous Every 24 hours 04/01/19 1534 04/06/19 1559        Objective:   Vitals:   04/01/19 2047 04/02/19 0100 04/02/19 0429 04/02/19 1038  BP: (!) 148/82 140/78 (!) 113/55 133/66  Pulse: 67 66 72 71  Resp: 20 18 18    Temp: 98.9 F (37.2 C) 98.3 F (36.8 C) 98 F (36.7 C)   TempSrc: Oral Oral Oral   SpO2: 98% 97% 100% 100%  Weight:      Height:        Wt Readings from Last 3 Encounters:  04/01/19 81.6 kg  08/17/18 95.3 kg  07/16/18 94.7 kg     Intake/Output Summary (Last 24 hours) at 04/02/2019 1049 Last data filed at 04/02/2019 0939 Gross per 24 hour  Intake 640.02 ml  Output 1400 ml  Net -759.98 ml     Physical Exam  Gen: not in distress, fatigue HEENT: no pallor, moist mucosa, supple neck Chest: clear b/l, no added sounds CVS: N S1&S2, no murmurs GI: soft, NT, ND, Musculoskeletal: warm, no edema CNS: AAOX3, non focal    Data Review:     CBC Recent Labs  Lab 04/01/19 0954 04/02/19 0128  WBC 5.3 10.2  HGB 11.6* 12.0  HCT 36.9 36.2  PLT 190 192  MCV 90.9 88.9  MCH 28.6 29.5  MCHC 31.4 33.1  RDW 14.2 14.1  LYMPHSABS 1.4 2.8  MONOABS 0.2 0.8  EOSABS 0.1 0.2  BASOSABS 0.0 0.0    Chemistries  Recent Labs  Lab 04/01/19 0954 04/01/19 1545 04/02/19 0128  NA 138  --  139  K 5.0  --  4.3  CL 105  --  104  CO2 27  --  25  GLUCOSE 197*  --  134*  BUN 17  --  15  CREATININE 1.12*  --  0.86  CALCIUM 9.3  --  9.3  MG 1.9  --   --   AST 28 27 20   ALT 26 25 22   ALKPHOS 68 68 60  BILITOT 0.5 0.6 0.5   ------------------------------------------------------------------------------------------------------------------ No results for input(s): CHOL, HDL, LDLCALC, TRIG, CHOLHDL, LDLDIRECT in the last 72 hours.  Lab Results  Component Value Date   HGBA1C 7.3 (H) 04/01/2019   ------------------------------------------------------------------------------------------------------------------ Recent Labs    04/01/19 1545  TSH 0.620   ------------------------------------------------------------------------------------------------------------------ Recent Labs    04/01/19 1546  VITAMINB12 345    Coagulation profile No results for input(s): INR, PROTIME in the last 168 hours.  No results for input(s): DDIMER in the last 72 hours.  Cardiac Enzymes No results for input(s): CKMB, TROPONINI, MYOGLOBIN in the last 168 hours.  Invalid input(s): CK ------------------------------------------------------------------------------------------------------------------    Component Value Date/Time   BNP 83.0 04/01/2019 0953    Inpatient Medications  Scheduled Meds: . Chlorhexidine Gluconate Cloth  6 each Topical Daily  . enalaprilat  0.625 mg Intravenous Q8H  . heparin  5,000 Units Subcutaneous Q8H  . hydrALAZINE  10 mg Intravenous Q8H  . influenza vaccine adjuvanted  0.5 mL Intramuscular Tomorrow-1000  .  insulin aspart  0-15 Units Subcutaneous Q4H  . metoprolol tartrate  2.5 mg Intravenous Q8H  . pneumococcal 23 valent vaccine  0.5 mL Intramuscular Tomorrow-1000  . sodium chloride flush  10-40 mL Intracatheter Q12H  . thiamine injection  100 mg Intravenous Q24H   Continuous Infusions: . sodium chloride 75 mL/hr (04/01/19 1851)  . cefTRIAXone (ROCEPHIN)  IV Stopped (04/02/19 0413)  . levETIRAcetam 1,000 mg (04/02/19 1020)   PRN Meds:.LORazepam  Micro Results Recent Results (from the past 240 hour(s))  Respiratory Panel by RT PCR (Flu A&B, Covid) - Nasopharyngeal Swab     Status: None   Collection Time: 04/01/19 11:41 AM   Specimen: Nasopharyngeal Swab  Result Value Ref Range Status   SARS Coronavirus 2 by RT PCR NEGATIVE NEGATIVE Final    Comment: (NOTE) SARS-CoV-2 target nucleic acids are NOT DETECTED. The SARS-CoV-2 RNA is generally detectable in upper respiratoy specimens during the acute phase of infection. The lowest concentration of SARS-CoV-2 viral copies this assay can detect is 131 copies/mL. A negative result does not preclude SARS-Cov-2 infection and should not be used as the sole basis for treatment or other patient management decisions. A negative result may occur with  improper specimen collection/handling, submission of specimen other than nasopharyngeal swab, presence of viral mutation(s) within the areas targeted by this assay, and inadequate number of viral copies (<131 copies/mL). A negative result must be combined with clinical observations, patient history, and epidemiological information. The expected result is Negative. Fact Sheet for Patients:  https://www.moore.com/ Fact Sheet for Healthcare Providers:  https://www.young.biz/ This test is not yet ap proved or cleared by the [place] FDA and  has been authorized for detection and/or diagnosis of SARS-CoV-2 by FDA under an Emergency Use Authorization (EUA). This  EUA will remain  in effect (meaning this test can be used) for the duration of the COVID-19 declaration under Section 564(b)(1) of the Act, 21 U.S.C. section 360bbb-3(b)(1), unless the authorization is terminated or revoked sooner.    Influenza A by PCR NEGATIVE NEGATIVE Final   Influenza B by PCR NEGATIVE NEGATIVE Final    Comment: (NOTE) The Xpert Xpress SARS-CoV-2/FLU/RSV assay is intended as an aid in  the diagnosis of influenza from Nasopharyngeal swab specimens and  should not be used as a sole basis for treatment. Nasal washings and  aspirates are unacceptable for Xpert Xpress SARS-CoV-2/FLU/RSV  testing. Fact Sheet for Patients: https://www.moore.com/ Fact Sheet for Healthcare Providers: https://www.young.biz/ This test is not yet approved or cleared by the [place] FDA and  has been authorized for detection and/or diagnosis of SARS-CoV-2 by  FDA under an Emergency Use Authorization (EUA). This EUA will remain  in effect (meaning this test can be used) for the duration of the  Covid-19 declaration under Section 564(b)(1) of the Act, 21  U.S.C. section 360bbb-3(b)(1), unless the authorization is  terminated or revoked. Performed at [facility], [address] [place], [place] [address]     Radiology Reports EEG  Result Date: 04/01/2019 Charlsie Quest, MD     04/01/2019  2:41 PM Patient Name: [name] MRN: [id number] Epilepsy Attending: Charlsie Quest Referring Physician/Provider: Dr. Georgiana Spinner Aroor Date: 04/01/2019 Duration: 22.27 minutes Patient history: 79 year old female with past medical history of "brain bleed in 2015" and seizures who presented after  multiple witnessed generalized tonic-clonic seizure-like episode at home.  EEG to evaluate for seizures. Level of alertness: awake AEDs during EEG study: keppra Technical aspects: This EEG study was done with scalp electrodes positioned according to the 10-20  International system of electrode placement. Electrical activity was acquired at a sampling rate of 500Hz  and reviewed with a high frequency filter of 70Hz  and a low frequency filter of 1Hz . EEG data were recorded continuously and digitally stored. DESCRIPTION: EEG showed continuous generalized 2 to 3 Hz delta activity in left hemisphere.  EEG also showed 8 Hz alpha activity in the right hemisphere as well as intermittent generalized 2 to 3 Hz delta activity. No clear posterior dominant rhythm was seen.  Hyperventilation and photic stimulation were not performed. Abnormality -Continuous slow, left hemisphere -Intermittent slow, generalized IMPRESSION: This study is suggestive of cortical dysfunction in the left hemisphere which is nonspecific to etiology but could be secondary to postictal state.  Additionally, there is evidence of mild diffuse encephalopathy.No seizures or epileptiform discharges were seen throughout the recording.   CT Head Wo Contrast  Result Date: 04/01/2019 CLINICAL DATA:  Encephalopathy and seizure.  Abnormal neuro exam. EXAM: CT HEAD WITHOUT CONTRAST TECHNIQUE: Contiguous axial images were obtained from the base of the skull through the vertex without intravenous contrast. COMPARISON:  None. FINDINGS: Brain: No evidence of acute infarction, hemorrhage, hydrocephalus, extra-axial collection or mass lesion/mass effect. Moderate low-density in the cerebral white matter attributed to chronic small vessel ischemia. There is cortical atrophy that is generalized. No focal cortical finding to explain seizure history. Vascular: No hyperdense vessel or unexpected calcification. Skull: No acute or aggressive finding Sinuses/Orbits: Mucosal thickening in the right nasal cavity where there is concha bullosa, incompletely covered. Negative orbits IMPRESSION: 1. No acute or reversible finding. 2. Atrophy and moderate chronic small vessel ischemia. Electronically Signed   By: M.D.   On: 04/01/2019 10:23   CT ANGIO CHEST PE W OR WO CONTRAST  Result Date: 04/01/2019 CLINICAL DATA:  Elevated D-dimer. Altered mental status. EXAM: CT ANGIOGRAPHY CHEST WITH CONTRAST TECHNIQUE: Multidetector CT imaging of the chest was performed using the standard protocol during bolus administration of intravenous contrast. Multiplanar CT image reconstructions and MIPs were obtained to evaluate the vascular anatomy. CONTRAST:  81mL OMNIPAQUE IOHEXOL 350 MG/ML SOLN COMPARISON:  Radiograph earlier this day. FINDINGS: Cardiovascular: There are no filling defects within the pulmonary arteries to suggest pulmonary embolus. Mild aortic atherosclerosis. No evidence of dissection. Upper normal heart size. No pericardial effusion. Mediastinum/Nodes: Small mediastinal and hilar lymph nodes not enlarged by size criteria. Small hiatal hernia. No esophageal wall thickening. No thyroid nodule. Lungs/Pleura: Mild heterogeneous pulmonary parenchyma with the ground-glass opacities. Mild dependent atelectasis in both lower lobes. No septal thickening. 4 mm subpleural nodule in the right upper lobe series 6, image 28. Trachea and mainstem bronchi are patent. No pleural fluid. Upper Abdomen: Suspected hepatic steatosis. No acute findings. Musculoskeletal: Remote sternal fracture. Mild flattening of T6 vertebral body appears chronic. There are no acute or suspicious osseous abnormalities. Review of the MIP images confirms the above findings. IMPRESSION: 1. No pulmonary embolus. 2. Mild heterogeneous pulmonary parenchyma with ground-glass opacities, may be related to air trapping or small airways disease. There is likely associated vascular congestion. 3. Right upper lobe 4 mm subpleural nodule. No follow-up needed if patient is low-risk. Non-contrast chest CT can be considered in 12 months if patient is high-risk. This recommendation follows the consensus statement: Guidelines for  Management of Incidental Pulmonary  Nodules Detected on CT Images: From the Fleischner Society 2017; Radiology 2017; 284:228-243. Aortic Atherosclerosis (ICD10-I70.0). Electronically Signed   By: Narda Rutherford M.D.   On: 04/01/2019 21:35   DG Chest Portable 1 View  Result Date: 04/01/2019 CLINICAL DATA:  Seizures EXAM: PORTABLE CHEST 1 VIEW COMPARISON:  None. FINDINGS: Mild pulmonary edema. No pleural effusion or pneumothorax. Top-normal heart size. IMPRESSION: Mild pulmonary edema. Electronically Signed   By: Guadlupe Spanish M.D.   On: 04/01/2019 11:05    Time Spent in minutes 35   Ezell Melikian M.D on 04/02/2019 at 10:49 AM  Between 7am to 7pm - Pager - [phone]  After 7pm go to [email or web address] - password [facility]  [facility] -  Office  [phone]

## 2019-04-02 NOTE — Progress Notes (Signed)
Reason for consult: Seizure  Subjective: Patient alert and oriented x4. No longer has right side weakness.  ROS: negative except above   Examination  Vital signs in last 24 hours: Temp:  [98 F (36.7 C)-98.9 F (37.2 C)] 98.6 F (37 C) (02/23 1159) Pulse Rate:  [65-73] 65 (02/23 1159) Resp:  [16-20] 18 (02/23 0429) BP: (113-148)/(55-87) 124/71 (02/23 1159) SpO2:  [94 %-100 %] 100 % (02/23 1159)  General: lying in bed CVS: pulse-normal rate and rhythm RS: breathing comfortably Extremities: normal   Neuro: MS: Alert, oriented, follows commands CN: pupils equal and reactive,  EOMI, face symmetric, tongue midline, normal sensation over face, Motor: 5/5 strength in all 4 extremities Reflexes: plantars: flexor Coordination: normal Gait: not tested  Basic Metabolic Panel: Recent Labs  Lab 04/01/19 0954 04/02/19 0128  NA 138 139  K 5.0 4.3  CL 105 104  CO2 27 25  GLUCOSE 197* 134*  BUN 17 15  CREATININE 1.12* 0.86  CALCIUM 9.3 9.3  MG 1.9  --     CBC: Recent Labs  Lab 04/01/19 0954 04/02/19 0128  WBC 5.3 10.2  NEUTROABS 3.5 6.4  HGB 11.6* 12.0  HCT 36.9 36.2  MCV 90.9 88.9  PLT 190 192     Coagulation Studies: No results for input(s): LABPROT, INR in the last 72 hours.  Imaging Reviewed:    ASSESSMENT AND PLAN  70-year female with history of seizures, ICH presents to the ED with multiple witnessed seizures. On assessment yesterday, patient appeared postictal with Todd's paresis. Stat EEG negative for seizures, showed left-sided cortical dysfunction.  Patient was given extra 1 g of Keppra-total 2 g and started on 1 g twice daily. MRI brain obtained-no acute abnormality  Patient denies non compliance of medication. Provoking factor likely UTI.   Status epilepticus: resolved  Todd's paresis: resolved Urinary tract infection  Recommendations Continue Keppra 1g BID, can switch to PO Seizure precautions, no drivingx 6 months  Antibiotics for UTI  per primary team    Needs outpatient neurology follow up.   Per [facility] statutes, patients with seizures are not allowed to drive until they have been seizure-free for six months. Use caution when using heavy equipment or power tools. Avoid working on ladders or at heights. Take showers instead of baths. Ensure the water temperature is not too high on the home water heater. Do not go swimming alone. Do not lock yourself in a room alone (i.e. bathroom). When caring for infants or small children, sit down when holding, feeding, or changing them to minimize risk of injury to the child in the event you have a seizure. Maintain good sleep hygiene. Avoid alcohol.    If [name] has another seizure, call 911 and bring them back to the ED if:       A.  The seizure lasts longer than 5 minutes.            B.  The patient doesn't wake shortly after the seizure or has new problems such as difficulty seeing, speaking or moving following the seizure       C.  The patient was injured during the seizure       D.  The patient has a temperature over 102 F (39C)       E.  The patient vomited during the seizure and now is having trouble breathing    [name] [facility] Pager Number [phone] For questions after 7pm please refer to AMION to reach  the Neurologist on call

## 2019-04-02 NOTE — Progress Notes (Signed)
*  PRELIMINARY RESULTS* Echocardiogram 2D Echocardiogram has been performed.  Cristela Blue 04/02/2019, 1:57 PM

## 2019-04-02 NOTE — Consult Note (Signed)
Cardiology Consultation:   Patient ID: [name] MRN: [id number]; DOB: Apr 24, 1940  Admit date: 04/01/2019 Date of Consult: 04/02/2019  Primary Care Provider: Marygrace Drought, MD Primary Cardiologist: [facility], Dr. Saunders Revel rounding Primary Electrophysiologist:  None    Patient Profile:   [name] is a 79 y.o. female with a hx of seizures, DM2, HTN, HLD, PVD, cellulitis, and self reported brain bleed who is being seen today for the evaluation of elevated HS Tn in the setting of sz at the request of Dr. Posey Pronto.  History of Present Illness:   [name] is a 79 yo female with no previously known cardiac history and a known history of seizures.  She was reportedly started on Keppra after being admitted to [facility] on December 18, 2018.  No documentation is available for further information regarding self-reported brain bleed in 2015.  On 03/31/2019, she suffered a seizure and presented to [facility] emergency department.  In the ED, she was described as having active, generalized, tonic-clonic seizure activity.  Vitals 166/92, HR 85 bpm. Labs sodium 138, potassium 5.0, creatinine 1.12, BUN 17, magnesium 1.9, BNP 83.0, hemoglobin 11.6, hematocrit 36.9. HS Tn  137  147   146  147  142, flat trending and minimally elevated. EKG without acute ST/T changes. She was given Versed with improvement.  Due to postictal status / confusion, history was limited in the ED due to altered mental status.  Neuro was consulted and reported status epilepticus and following. Cardiology was consulted for elevated HS Tn and concern that her known seizures may be secondary to underlying cardiac etiology.  On consultation today, history is still somewhat limited due to altered mental status and as patient appears confused and at one point feels her husband is on the hospital floor with her (prior to visiting hours).  She reports that she had a seizure but has no history of seizures (contrary to medical history).  She  denies any chest pain, palpitations, racing heart rate, presyncope, or syncope.  No SOB/DOE but does report occasional leg edema at times. She is uncertain of what medications she takes at home. She does note a previous history of smoking, reporting that she quit many years ago.  She also notes intermittent use of alcohol.  No illegal drugs reported.    Heart Pathway Score:     Past Medical History:  Diagnosis Date  . Cellulitis of left foot 05/23/2016  . Diabetes mellitus without complication ([place])   . Diabetic ulcer of left foot ([place]) 05/23/2016  . Hyperlipidemia   . Hypertension   . Peripheral vascular disease ([place])   . Vitamin D deficiency 05/23/2016    History reviewed. No pertinent surgical history.   Home Medications:  Prior to Admission medications   Medication Sig Start Date End Date Taking? Authorizing Provider  Blood Glucose Monitoring Suppl (ACCU-CHEK GUIDE ME) w/Device KIT USE UP TO FOUR TIMES DAILY AS DIRECTED 09/03/18  Yes Glean Hess, MD  glucose blood test strip Use per insurance brand. Diagnosis:Diabetes Mellitus 2 Uncontrolled without Complications = R91. 65. Check Blood Sugar up to four times daily. 08/23/16  Yes Glean Hess, MD  Lancets MISC Use per insurance brand. To use with Glucometer to monitor sugar up to four times daily. 08/19/16  Yes Plonk, Gwyndolyn Saxon, MD  levETIRAcetam (KEPPRA) 1000 MG tablet Take 1,000 mg by mouth 2 (two) times daily. 12/18/18  Yes [provider]  lisinopril-hydrochlorothiazide (ZESTORETIC) 10-12.5 MG tablet Take 1 tablet by mouth daily. 08/17/18  Yes  Glean Hess, MD  Multiple Vitamin (MULTIVITAMIN) capsule Take 1 capsule by mouth daily.   Yes [provider]  pravastatin (PRAVACHOL) 40 MG tablet Take 1 tablet (40 mg total) by mouth daily. 08/17/18  Yes Glean Hess, MD  sitaGLIPtin-metformin (JANUMET) 50-500 MG tablet Take 1 tablet by mouth 2 (two) times daily with a meal. 08/17/18  Yes Glean Hess, MD   Vitamin D, Cholecalciferol, 25 MCG (1000 UT) CAPS Take 1 capsule by mouth daily.   Yes [provider]    Inpatient Medications: Scheduled Meds: . Chlorhexidine Gluconate Cloth  6 each Topical Daily  . enalaprilat  0.625 mg Intravenous Q8H  . heparin  5,000 Units Subcutaneous Q8H  . hydrALAZINE  10 mg Intravenous Q8H  . influenza vaccine adjuvanted  0.5 mL Intramuscular Tomorrow-1000  . insulin aspart  0-15 Units Subcutaneous Q4H  . metoprolol tartrate  2.5 mg Intravenous Q8H  . pneumococcal 23 valent vaccine  0.5 mL Intramuscular Tomorrow-1000  . sodium chloride flush  10-40 mL Intracatheter Q12H  . thiamine injection  100 mg Intravenous Q24H   Continuous Infusions: . sodium chloride 75 mL/hr (04/01/19 1851)  . cefTRIAXone (ROCEPHIN)  IV Stopped (04/02/19 0413)  . levETIRAcetam 1,000 mg (04/02/19 1020)   PRN Meds: LORazepam  Allergies:   No Known Allergies  Social History:   Social History   Socioeconomic History  . Marital status: Married    Spouse name: Arnell Sieving  . Number of children: 2  . Years of education: Not on file  . Highest education level: 12th grade  Occupational History  . Occupation: Retired  Tobacco Use  . Smoking status: Former Smoker    Packs/day: 0.25    Years: 50.00    Pack years: 12.50    Types: Cigarettes    Quit date: 2009    Years since quitting: 12.1  . Smokeless tobacco: Never Used  . Tobacco comment: smoking cessation materials not required  Substance and Sexual Activity  . Alcohol use: Yes    Comment: every now and then  . Drug use: Never  . Sexual activity: Not Currently  Other Topics Concern  . Not on file  Social History Narrative  . Not on file   Social Determinants of Health   Financial Resource Strain:   . Difficulty of Paying Living Expenses: Not on file  Food Insecurity:   . Worried About Charity fundraiser in the Last Year: Not on file  . Ran Out of Food in the Last Year: Not on file  Transportation Needs:    . Lack of Transportation (Medical): Not on file  . Lack of Transportation (Non-Medical): Not on file  Physical Activity:   . Days of Exercise per Week: Not on file  . Minutes of Exercise per Session: Not on file  Stress:   . Feeling of Stress : Not on file  Social Connections: Unknown  . Frequency of Communication with Friends and Family: More than three times a week  . Frequency of Social Gatherings with Friends and Family: Three times a week  . Attends Religious Services: More than 4 times per year  . Active Member of Clubs or Organizations: No  . Attends Archivist Meetings: Never  . Marital Status: Not on file  Intimate Partner Violence:   . Fear of Current or Ex-Partner: Not on file  . Emotionally Abused: Not on file  . Physically Abused: Not on file  . Sexually Abused: Not on file  Family History:    Family History  Problem Relation Age of Onset  . Healthy Mother   . Healthy Father      ROS:  Please see the history of present illness.  Review of Systems  Respiratory: Negative for shortness of breath.   Cardiovascular: Positive for leg swelling. Negative for chest pain, palpitations, orthopnea and PND.       She does report some leg edema "at times."  Neurological: Positive for seizures and loss of consciousness.       Confusion  All other systems reviewed and are negative.   All other ROS reviewed and negative.     Physical Exam/Data:   Vitals:   04/01/19 2047 04/02/19 0100 04/02/19 0429 04/02/19 1038  BP: (!) 148/82 140/78 (!) 113/55 133/66  Pulse: 67 66 72 71  Resp: _0 Temp: 98.9 F (37.2 C) 98.3 F (36.8 C) 98 F (36.7 C)   TempSrc: Oral Oral Oral   SpO2: 98% 97% 100% 100%  Weight:      Height:        Intake/Output Summary (Last 24 hours) at 04/02/2019 1046 Last data filed at 04/02/2019 0939 Gross per 24 hour  Intake 640.02 ml  Output 1400 ml  Net -759.98 ml   Last 3 Weights 04/01/2019 08/17/2018 07/16/2018  Weight (lbs) 180 lb  210 lb 208 lb 12.8 oz  Weight (kg) 81.647 kg 95.255 kg 94.711 kg     Body mass index is 27.37 kg/m.  General: Elderly female, NAD.  HEENT: normal Neck: no JVD Vascular: No carotid bruits; radial pulses 2+ bilaterally Cardiac:  normal S1, S2; RRR; no murmur  Lungs:  clear to auscultation bilaterally, no wheezing, rhonchi or rales  Abd: soft, nontender, no hepatomegaly  Ext: mild bilateral edema and skin changes consistent with PVD Musculoskeletal:  No deformities, BUE and BLE strength normal and equal Skin: warm and dry  Neuro:  Confused Psych:  Confusedt   EKG:  The EKG was personally reviewed and demonstrates:  NSR, 90bpm Telemetry:  Telemetry was personally reviewed and demonstrates:  NSR with PVCs  Relevant CV Studies: Echo pending  Laboratory Data:  High Sensitivity Troponin:   Recent Labs  Lab 04/01/19 1545 04/01/19 2015 04/01/19 2126 04/01/19 2315 04/02/19 0128  TROPONINIHS 137* 147* 146* 147* 142*     Cardiac EnzymesNo results for input(s): TROPONINI in the last 168 hours. No results for input(s): TROPIPOC in the last 168 hours.  Chemistry Recent Labs  Lab 04/01/19 0954 04/02/19 0128  NA 138 139  K 5.0 4.3  CL 105 104  CO2 27 25  GLUCOSE 197* 134*  BUN 17 15  CREATININE 1.12* 0.86  CALCIUM 9.3 9.3  GFRNONAA 47* >60  GFRAA 54* >60  ANIONGAP 6 10    Recent Labs  Lab 04/01/19 0954 04/01/19 1545 04/02/19 0128  PROT 7.2 8.0 6.9  ALBUMIN 3.7 3.9 3.5  AST _1 ALT _2 ALKPHOS 68 68 60  BILITOT 0.5 0.6 0.5   Hematology Recent Labs  Lab 04/01/19 0954 04/02/19 0128  WBC 5.3 10.2  RBC 4.06 4.07  HGB 11.6* 12.0  HCT 36.9 36.2  MCV 90.9 88.9  MCH 28.6 29.5  MCHC 31.4 33.1  RDW 14.2 14.1  PLT 190 192   BNP Recent Labs  Lab 04/01/19 0953  BNP 83.0    DDimer No results for input(s): DDIMER in the last 168 hours.   Radiology/Studies:  EEG  Result  Date: 04/01/2019 Lora Havens, MD     04/01/2019  2:41 PM Patient Name:  [name] MRN: [id number] Epilepsy Attending: Lora Havens Referring Physician/Provider: Dr. Karena Addison Aroor Date: 04/01/2019 Duration: 22.27 minutes Patient history: 79 year old female with past medical history of "brain bleed in 2015" and seizures who presented after multiple witnessed generalized tonic-clonic seizure-like episode at home.  EEG to evaluate for seizures. Level of alertness: awake AEDs during EEG study: keppra Technical aspects: This EEG study was done with scalp electrodes positioned according to the 10-20 International system of electrode placement. Electrical activity was acquired at a sampling rate of _0  and reviewed with a high frequency filter of _1  and a low frequency filter of _2 . EEG data were recorded continuously and digitally stored. DESCRIPTION: EEG showed continuous generalized 2 to 3 Hz delta activity in left hemisphere.  EEG also showed 8 Hz alpha activity in the right hemisphere as well as intermittent generalized 2 to 3 Hz delta activity. No clear posterior dominant rhythm was seen.  Hyperventilation and photic stimulation were not performed. Abnormality -Continuous slow, left hemisphere -Intermittent slow, generalized IMPRESSION: This study is suggestive of cortical dysfunction in the left hemisphere which is nonspecific to etiology but could be secondary to postictal state.  Additionally, there is evidence of mild diffuse encephalopathy.No seizures or epileptiform discharges were seen throughout the recording. Lora Havens   CT Head Wo Contrast  Result Date: 04/01/2019 CLINICAL DATA:  Encephalopathy and seizure.  Abnormal neuro exam. EXAM: CT HEAD WITHOUT CONTRAST TECHNIQUE: Contiguous axial images were obtained from the base of the skull through the vertex without intravenous contrast. COMPARISON:  None. FINDINGS: Brain: No evidence of acute infarction, hemorrhage, hydrocephalus, extra-axial collection or mass lesion/mass effect. Moderate low-density in  the cerebral white matter attributed to chronic small vessel ischemia. There is cortical atrophy that is generalized. No focal cortical finding to explain seizure history. Vascular: No hyperdense vessel or unexpected calcification. Skull: No acute or aggressive finding Sinuses/Orbits: Mucosal thickening in the right nasal cavity where there is concha bullosa, incompletely covered. Negative orbits IMPRESSION: 1. No acute or reversible finding. 2. Atrophy and moderate chronic small vessel ischemia. Electronically Signed   By: Monte Fantasia M.D.   On: 04/01/2019 10:23   CT ANGIO CHEST PE W OR WO CONTRAST  Result Date: 04/01/2019 CLINICAL DATA:  Elevated D-dimer. Altered mental status. EXAM: CT ANGIOGRAPHY CHEST WITH CONTRAST TECHNIQUE: Multidetector CT imaging of the chest was performed using the standard protocol during bolus administration of intravenous contrast. Multiplanar CT image reconstructions and MIPs were obtained to evaluate the vascular anatomy. CONTRAST:  77m OMNIPAQUE IOHEXOL 350 MG/ML SOLN COMPARISON:  Radiograph earlier this day. FINDINGS: Cardiovascular: There are no filling defects within the pulmonary arteries to suggest pulmonary embolus. Mild aortic atherosclerosis. No evidence of dissection. Upper normal heart size. No pericardial effusion. Mediastinum/Nodes: Small mediastinal and hilar lymph nodes not enlarged by size criteria. Small hiatal hernia. No esophageal wall thickening. No thyroid nodule. Lungs/Pleura: Mild heterogeneous pulmonary parenchyma with the ground-glass opacities. Mild dependent atelectasis in both lower lobes. No septal thickening. 4 mm subpleural nodule in the right upper lobe series 6, image 28. Trachea and mainstem bronchi are patent. No pleural fluid. Upper Abdomen: Suspected hepatic steatosis. No acute findings. Musculoskeletal: Remote sternal fracture. Mild flattening of T6 vertebral body appears chronic. There are no acute or suspicious osseous abnormalities.  Review of the MIP images confirms the above findings. IMPRESSION: 1. No pulmonary embolus. 2. Mild heterogeneous pulmonary parenchyma with  ground-glass opacities, may be related to air trapping or small airways disease. There is likely associated vascular congestion. 3. Right upper lobe 4 mm subpleural nodule. No follow-up needed if patient is low-risk. Non-contrast chest CT can be considered in 12 months if patient is high-risk. This recommendation follows the consensus statement: Guidelines for Management of Incidental Pulmonary Nodules Detected on CT Images: From the Fleischner Society 2017; Radiology 2017; 284:228-243. Aortic Atherosclerosis (ICD10-I70.0). Electronically Signed   By: Keith Rake M.D.   On: 04/01/2019 21:35   DG Chest Portable 1 View  Result Date: 04/01/2019 CLINICAL DATA:  Seizures EXAM: PORTABLE CHEST 1 VIEW COMPARISON:  None. FINDINGS: Mild pulmonary edema. No pleural effusion or pneumothorax. Top-normal heart size. IMPRESSION: Mild pulmonary edema. Electronically Signed   By: Macy Mis M.D.   On: 04/01/2019 11:05    Assessment and Plan:   Elevated troponin without CP Supply Demand Ischemia --No chest pain.  Minimally elevated and flat trending troponin, not consistent with ACS.  EKG without acute ST or T changes.  Suspect high-sensitivity troponin elevated in the setting of recent seizure.  No indication for IV heparin.  No plan for cardiac catheterization this admission.  We will obtain an echocardiogram to rule out low EF, valvular abnormalities, or acute structural changes.  Continue to monitor on telemetry. Daily BMET, CBC. If no significant cardiac findings on review of echo and telemetry,  defer to neuro for further work-up.   Seizures --Known history of sz. Defer further workup to neuro.   For questions or updates, please contact [place] Please consult [email or web address] for contact info under     Signed, Arvil Chaco, PA-C  04/02/2019 10:46 AM

## 2019-04-03 ENCOUNTER — Encounter: Payer: Self-pay | Admitting: Internal Medicine

## 2019-04-03 DIAGNOSIS — I10 Essential (primary) hypertension: Secondary | ICD-10-CM

## 2019-04-03 LAB — GLUCOSE, CAPILLARY
Glucose-Capillary: 142 mg/dL — ABNORMAL HIGH (ref 70–99)
Glucose-Capillary: 173 mg/dL — ABNORMAL HIGH (ref 70–99)

## 2019-04-03 LAB — URINE CULTURE: Culture: >=100000 — AB

## 2019-04-03 LAB — ECHOCARDIOGRAM COMPLETE
Height: 68 in
Weight: 2880 oz

## 2019-04-03 MED ORDER — LEVETIRACETAM 1000 MG PO TABS: 1000.0000 mg | ORAL_TABLET | Freq: Two times a day (BID) | ORAL | 1 refills | Status: DC

## 2019-04-03 MED ORDER — LEVETIRACETAM 500 MG PO TABS: 1000.0000 mg | ORAL_TABLET | Freq: Two times a day (BID) | ORAL | Status: DC

## 2019-04-03 MED ORDER — CEFDINIR 300 MG PO CAPS: 300.0000 mg | ORAL_CAPSULE | Freq: Two times a day (BID) | ORAL | 0 refills | Status: AC

## 2019-04-03 NOTE — Discharge Summary (Addendum)
Physician Discharge Summary  Dianna Ewald QMG:[id number] DOB: August 30, 1940 DOA: 04/01/2019  PCP: Marygrace Drought, MD  Admit date: 04/01/2019 Discharge date: 04/03/2019  Admitted From:  Disposition:    Recommendations for Outpatient Follow-up:  1. Follow up with PCP in 1-2 weeks 2. Please obtain BMP/CBC in one week 3. Please follow up on the following pending results:None  Home Health: No Equipment/Devices: None Discharge Condition: Stable CODE STATUS: Full Diet recommendation: Heart Healthy / Carb Modified   Brief/Interim Summary: 79 year old female with history of hypertension, hyperlipidemia, history of ICH with CVA and seizure disorder (on Keppra started in November 2020 at [facility] after she was admitted for seizures.), peripheral vascular disease and diabetes mellitus who presented to the ED with acute seizures and encephalopathy.  She received Versed by EMS in route.  Patient was encephalopathic and nonverbal upon presentation and placed on nonrebreather..  Head CT was unremarkable for acute findings.  Given loading dose of IV Keppra and started on IV Keppra.  She was admitted for seizures with Todd's paralysis.  Neurology was consulted.  MRI brain without any acute abnormality.  Her symptoms resolved and she did not experience any more seizures.  She was discharged on Keppra 1000 mg twice daily and will follow up with neurology as an outpatient.  Was also instructed against driving, per patient she does not drive.  She was also found to have UTI, urine culture grew E. coli with good sensitivity.  She was treated with Rocephin while in hospital and discharged home on cefdinir to complete a total of 5-day course.  She was also found to have mildly elevated troponin, most likely secondary to demand with multiple seizures.  Cardiology was consulted, echocardiogram was with normal EF and no wall motion abnormalities.  No further cardiac work-up recommended.  She was instructed to continue  her home meds and follow-up with primary care physician.  Discharge Diagnoses:  Principal Problem:   Seizure disorder as sequela of cerebrovascular accident [facility]) Active Problems:   Type II diabetes mellitus with complication ([place])   Hyperlipidemia associated with type 2 diabetes mellitus ([place])   B12 deficiency   Hypertension   Cerebrovascular accident (CVA) ([place])   [place] (intracerebral hemorrhage) (HCC)   Elevated troponin   Discharge Instructions  Discharge Instructions    Diet - low sodium heart healthy   Complete by: As directed    Discharge instructions   Complete by: As directed    It was pleasure taking care of you. I am giving you antibiotics for your urinary tract infection for 3 more days to complete the course. Please continue taking antiseizure medicine called Keppra as directed and follow-up with your neurologist. Do not drive for 6 months or until cleared by neurology. Follow-up with your primary care physician in 1 to 2 weeks.   Increase activity slowly   Complete by: As directed      Allergies as of 04/03/2019   No Known Allergies     Medication List    TAKE these medications   Accu-Chek Guide Me w/Device Kit USE UP TO FOUR TIMES DAILY AS DIRECTED   cefdinir 300 MG capsule Commonly known as: OMNICEF Take 1 capsule (300 mg total) by mouth 2 (two) times daily for 3 days.   glucose blood test strip Use per insurance brand. Diagnosis:Diabetes Mellitus 2 Uncontrolled without Complications = T26. 65. Check Blood Sugar up to four times daily.   Janumet 50-500 MG tablet Generic drug: sitaGLIPtin-metformin Take 1 tablet by mouth 2 (two) times  daily with a meal.   Lancets Misc Use per insurance brand. To use with Glucometer to monitor sugar up to four times daily.   levETIRAcetam 1000 MG tablet Commonly known as: KEPPRA Take 1 tablet (1,000 mg total) by mouth 2 (two) times daily.   lisinopril-hydrochlorothiazide 10-12.5 MG tablet Commonly known as:  ZESTORETIC Take 1 tablet by mouth daily.   multivitamin capsule Take 1 capsule by mouth daily.   pravastatin 40 MG tablet Commonly known as: PRAVACHOL Take 1 tablet (40 mg total) by mouth daily.   Vitamin D (Cholecalciferol) 25 MCG (1000 UT) Caps Take 1 capsule by mouth daily.      Follow-up Information    End, Harrell Gave, MD On 04/10/2019.   Specialty: Cardiology Why: 10:30am appointment Contact information: [place] [address] [phone]          No Known Allergies  Consultations:  Neurology  Procedures/Studies: EEG  Result Date: 04/01/2019 Lora Havens, MD     04/01/2019  2:41 PM Patient Name: Demani Weyrauch MRN: [id number] Epilepsy Attending: Lora Havens Referring Physician/Provider: Dr. Karena Addison Aroor Date: 04/01/2019 Duration: 22.27 minutes Patient history: 79 year old female with past medical history of "brain bleed in 2015" and seizures who presented after multiple witnessed generalized tonic-clonic seizure-like episode at home.  EEG to evaluate for seizures. Level of alertness: awake AEDs during EEG study: keppra Technical aspects: This EEG study was done with scalp electrodes positioned according to the 10-20 International system of electrode placement. Electrical activity was acquired at a sampling rate of '500Hz'  and reviewed with a high frequency filter of '70Hz'  and a low frequency filter of '1Hz' . EEG data were recorded continuously and digitally stored. DESCRIPTION: EEG showed continuous generalized 2 to 3 Hz delta activity in left hemisphere.  EEG also showed 8 Hz alpha activity in the right hemisphere as well as intermittent generalized 2 to 3 Hz delta activity. No clear posterior dominant rhythm was seen.  Hyperventilation and photic stimulation were not performed. Abnormality -Continuous slow, left hemisphere -Intermittent slow, generalized IMPRESSION: This study is suggestive of cortical dysfunction in the left hemisphere  which is nonspecific to etiology but could be secondary to postictal state.  Additionally, there is evidence of mild diffuse encephalopathy.No seizures or epileptiform discharges were seen throughout the recording. Lora Havens   CT Head Wo Contrast  Result Date: 04/01/2019 CLINICAL DATA:  Encephalopathy and seizure.  Abnormal neuro exam. EXAM: CT HEAD WITHOUT CONTRAST TECHNIQUE: Contiguous axial images were obtained from the base of the skull through the vertex without intravenous contrast. COMPARISON:  None. FINDINGS: Brain: No evidence of acute infarction, hemorrhage, hydrocephalus, extra-axial collection or mass lesion/mass effect. Moderate low-density in the cerebral white matter attributed to chronic small vessel ischemia. There is cortical atrophy that is generalized. No focal cortical finding to explain seizure history. Vascular: No hyperdense vessel or unexpected calcification. Skull: No acute or aggressive finding Sinuses/Orbits: Mucosal thickening in the right nasal cavity where there is concha bullosa, incompletely covered. Negative orbits IMPRESSION: 1. No acute or reversible finding. 2. Atrophy and moderate chronic small vessel ischemia. Electronically Signed   By: Monte Fantasia M.D.   On: 04/01/2019 10:23   CT ANGIO CHEST PE W OR WO CONTRAST  Result Date: 04/01/2019 CLINICAL DATA:  Elevated D-dimer. Altered mental status. EXAM: CT ANGIOGRAPHY CHEST WITH CONTRAST TECHNIQUE: Multidetector CT imaging of the chest was performed using the standard protocol during bolus administration of intravenous contrast. Multiplanar CT image reconstructions and MIPs  were obtained to evaluate the vascular anatomy. CONTRAST:  4m OMNIPAQUE IOHEXOL 350 MG/ML SOLN COMPARISON:  Radiograph earlier this day. FINDINGS: Cardiovascular: There are no filling defects within the pulmonary arteries to suggest pulmonary embolus. Mild aortic atherosclerosis. No evidence of dissection. Upper normal heart size. No  pericardial effusion. Mediastinum/Nodes: Small mediastinal and hilar lymph nodes not enlarged by size criteria. Small hiatal hernia. No esophageal wall thickening. No thyroid nodule. Lungs/Pleura: Mild heterogeneous pulmonary parenchyma with the ground-glass opacities. Mild dependent atelectasis in both lower lobes. No septal thickening. 4 mm subpleural nodule in the right upper lobe series 6, image 28. Trachea and mainstem bronchi are patent. No pleural fluid. Upper Abdomen: Suspected hepatic steatosis. No acute findings. Musculoskeletal: Remote sternal fracture. Mild flattening of T6 vertebral body appears chronic. There are no acute or suspicious osseous abnormalities. Review of the MIP images confirms the above findings. IMPRESSION: 1. No pulmonary embolus. 2. Mild heterogeneous pulmonary parenchyma with ground-glass opacities, may be related to air trapping or small airways disease. There is likely associated vascular congestion. 3. Right upper lobe 4 mm subpleural nodule. No follow-up needed if patient is low-risk. Non-contrast chest CT can be considered in 12 months if patient is high-risk. This recommendation follows the consensus statement: Guidelines for Management of Incidental Pulmonary Nodules Detected on CT Images: From the Fleischner Society 2017; Radiology 2017; 284:228-243. Aortic Atherosclerosis (ICD10-I70.0). Electronically Signed   By: MKeith RakeM.D.   On: 04/01/2019 21:35   MR BRAIN WO CONTRAST  Result Date: 04/02/2019 CLINICAL DATA:  Hypertension, hyperlipidemia, prior history of ICH, CVA and seizure disorder. EXAM: MRI HEAD WITHOUT CONTRAST TECHNIQUE: Multiplanar, multiecho pulse sequences of the brain and surrounding structures were obtained without intravenous contrast. COMPARISON:  CT of the head April 01, 2019 FINDINGS: The study is partially degraded by motion. Brain: No acute infarction, hemorrhage, hydrocephalus, extra-axial collection or mass lesion. Remote lacunar  infarcts are seen in the bilateral cerebellar hemispheres, basal ganglia and thalami. Scattered and confluent T2 hyperintensity within the white matter of the cerebral hemispheres nonspecific, most likely related to chronic small vessel ischemia. There is prominence of the supratentorial ventricles and cerebral sulci, reflecting parenchymal volume loss. There is marked cerebella volume loss. This can be seen in long-term use of Phenytoin. Vascular: Normal flow voids. Skull and upper cervical spine: Normal marrow signal. Sinuses/Orbits: Negative. Other: None. IMPRESSION: 1. No acute intracranial abnormality. 2. Moderate to advanced chronic white matter disease. 3. Marked cerebellar parenchymal volume loss. This can be seen in long-term use of Phenytoin. Electronically Signed   By: KPedro EarlsM.D.   On: 04/02/2019 13:34   DG Chest Portable 1 View  Result Date: 04/01/2019 CLINICAL DATA:  Seizures EXAM: PORTABLE CHEST 1 VIEW COMPARISON:  None. FINDINGS: Mild pulmonary edema. No pleural effusion or pneumothorax. Top-normal heart size. IMPRESSION: Mild pulmonary edema. Electronically Signed   By: PMacy MisM.D.   On: 04/01/2019 11:05   ECHOCARDIOGRAM COMPLETE  Result Date: 04/03/2019    ECHOCARDIOGRAM REPORT   Patient Name:   TDARENDA FIKEDate of Exam: 04/02/2019 Medical Rec #:  [id number]         Height:       68.0 in Accession #:    [id number]        Weight:       180.0 lb Date of Birth:  9July 27, 1942         BSA:          1.954 m Patient  Age:    82 years           BP:           124/71 mmHg Patient Gender: F                  HR:           65 bpm. Exam Location:  [facility] Procedure: 2D Echo, Cardiac Doppler and Color Doppler Indications:     NSTEMI 121.4  History:         Patient has no prior history of Echocardiogram examinations.                  Risk Factors:Hypertension and Diabetes. PVD.  Sonographer:     Sherrie Sport RDCS (AE) Referring Phys:  [facility] Diagnosing Phys:  Nelva Bush MD IMPRESSIONS  1. Left ventricular ejection fraction, by estimation, is 55 to 60%. The left ventricle has normal function. The left ventricle has no regional wall motion abnormalities. There is moderate left ventricular hypertrophy. Left ventricular diastolic parameters are consistent with Grade II diastolic dysfunction (pseudonormalization). Elevated left atrial pressure.  2. Right ventricular systolic function is mildly reduced. The right ventricular size is mildly enlarged. Tricuspid regurgitation signal is inadequate for assessing PA pressure.  3. Left atrial size was mildly dilated.  4. Right atrial size was mildly dilated.  5. The mitral valve is degenerative. Mild mitral valve regurgitation. No evidence of mitral stenosis.  6. The aortic valve is tricuspid. Aortic valve regurgitation is not visualized. Mild aortic valve sclerosis is present, with no evidence of aortic valve stenosis. FINDINGS  Left Ventricle: Left ventricular ejection fraction, by estimation, is 55 to 60%. The left ventricle has normal function. The left ventricle has no regional wall motion abnormalities. The left ventricular internal cavity size was normal in size. There is  moderate left ventricular hypertrophy. Left ventricular diastolic parameters are consistent with Grade II diastolic dysfunction (pseudonormalization). Elevated left atrial pressure. Right Ventricle: The right ventricular size is mildly enlarged. No increase in right ventricular wall thickness. Right ventricular systolic function is mildly reduced. Tricuspid regurgitation signal is inadequate for assessing PA pressure. Left Atrium: Left atrial size was mildly dilated. Right Atrium: Right atrial size was mildly dilated. Pericardium: There is no evidence of pericardial effusion. Mitral Valve: The mitral valve is degenerative in appearance. There is moderate thickening of the mitral valve leaflet(s). There is mild calcification of the mitral valve  leaflet(s). Mild mitral valve regurgitation. No evidence of mitral valve stenosis. Tricuspid Valve: The tricuspid valve is normal in structure. Tricuspid valve regurgitation is mild. Aortic Valve: The aortic valve is tricuspid. . There is mild thickening and mild calcification of the aortic valve. Aortic valve regurgitation is not visualized. Mild aortic valve sclerosis is present, with no evidence of aortic valve stenosis. There is mild thickening of the aortic valve. There is mild calcification of the aortic valve. Aortic valve mean gradient measures 4.5 mmHg. Aortic valve peak gradient measures 8.1 mmHg. Aortic valve area, by VTI measures 2.61 cm. Pulmonic Valve: The pulmonic valve was not well visualized. Pulmonic valve regurgitation is not visualized. No evidence of pulmonic stenosis. Aorta: The aortic root is normal in size and structure. Pulmonary Artery: The pulmonary artery is not well seen. Venous: The inferior vena cava was not well visualized. IAS/Shunts: The interatrial septum was not well visualized.  LEFT VENTRICLE PLAX 2D LVIDd:         4.13 cm  Diastology LVIDs:  2.22 cm  LV e' lateral:   8.49 cm/s LV PW:         1.39 cm  LV E/e' lateral: 12.0 LV IVS:        1.34 cm  LV e' medial:    5.11 cm/s LVOT diam:     2.00 cm  LV E/e' medial:  20.0 LV SV:         81 LV SV Index:   41 LVOT Area:     3.14 cm  RIGHT VENTRICLE RV Basal diam:  4.43 cm RV S prime:     7.72 cm/s TAPSE (M-mode): 1.6 cm LEFT ATRIUM             Index       RIGHT ATRIUM           Index LA diam:        4.20 cm 2.15 cm/m  RA Area:     18.40 cm LA Vol (A2C):   74.0 ml 37.87 ml/m RA Volume:   49.00 ml  25.07 ml/m LA Vol (A4C):   80.9 ml 41.40 ml/m LA Biplane Vol: 77.9 ml 39.86 ml/m  AORTIC VALVE                   PULMONIC VALVE AV Area (Vmax):    1.73 cm    PV Vmax:        0.90 m/s AV Area (Vmean):   1.56 cm    PV Peak grad:   3.2 mmHg AV Area (VTI):     2.61 cm    RVOT Peak grad: 2 mmHg AV Vmax:           142.50 cm/s AV  Vmean:          96.450 cm/s AV VTI:            0.310 m AV Peak Grad:      8.1 mmHg AV Mean Grad:      4.5 mmHg LVOT Vmax:         78.30 cm/s LVOT Vmean:        48.000 cm/s LVOT VTI:          0.257 m LVOT/AV VTI ratio: 0.83  AORTA Ao Root diam: 2.90 cm MITRAL VALVE MV Area (PHT): 3.99 cm     SHUNTS MV Decel Time: 190 msec     Systemic VTI:  0.26 m MV E velocity: 102.00 cm/s  Systemic Diam: 2.00 cm MV A velocity: 75.20 cm/s MV E/A ratio:  1.36 Harrell Gave End MD Electronically signed by Nelva Bush MD Signature Date/Time: 04/03/2019/7:00:55 AM    Final     Subjective: Patient was feeling better when seen this morning.  She was accompanied by her husband.  No new complaints.  She was eager to go home.  Discharge Exam: Vitals:   04/03/19 0504 04/03/19 1134  BP: 122/71 137/71  Pulse: 64 65  Resp: 20 18  Temp: 98.7 F (37.1 C) 98.4 F (36.9 C)  SpO2: 100% 98%   Vitals:   04/02/19 1159 04/02/19 2012 04/03/19 0504 04/03/19 1134  BP: 124/71 128/79 122/71 137/71  Pulse: 65 71 64 65  Resp:  '20 20 18  ' Temp: 98.6 F (37 C) 99.9 F (37.7 C) 98.7 F (37.1 C) 98.4 F (36.9 C)  TempSrc: Oral Oral Oral Oral  SpO2: 100% 100% 100% 98%  Weight:      Height:        General: Pt is alert, awake, not in  acute distress Cardiovascular: RRR, S1/S2 +, no rubs, no gallops Respiratory: CTA bilaterally, no wheezing, no rhonchi Abdominal: Soft, NT, ND, bowel sounds + Extremities: no edema, no cyanosis, chronic hyperpigmentation of bilateral lower extremities.   The results of significant diagnostics from this hospitalization (including imaging, microbiology, ancillary and laboratory) are listed below for reference.    Microbiology: Recent Results (from the past 240 hour(s))  Respiratory Panel by RT PCR (Flu A&B, Covid) - Nasopharyngeal Swab     Status: None   Collection Time: 04/01/19 11:41 AM   Specimen: Nasopharyngeal Swab  Result Value Ref Range Status   SARS Coronavirus 2 by RT PCR NEGATIVE  NEGATIVE Final    Comment: (NOTE) SARS-CoV-2 target nucleic acids are NOT DETECTED. The SARS-CoV-2 RNA is generally detectable in upper respiratoy specimens during the acute phase of infection. The lowest concentration of SARS-CoV-2 viral copies this assay can detect is 131 copies/mL. A negative result does not preclude SARS-Cov-2 infection and should not be used as the sole basis for treatment or other patient management decisions. A negative result may occur with  improper specimen collection/handling, submission of specimen other than nasopharyngeal swab, presence of viral mutation(s) within the areas targeted by this assay, and inadequate number of viral copies (<131 copies/mL). A negative result must be combined with clinical observations, patient history, and epidemiological information. The expected result is Negative. Fact Sheet for Patients:  PinkCheek.be Fact Sheet for Healthcare Providers:  GravelBags.it This test is not yet ap proved or cleared by the [place] FDA and  has been authorized for detection and/or diagnosis of SARS-CoV-2 by FDA under an Emergency Use Authorization (EUA). This EUA will remain  in effect (meaning this test can be used) for the duration of the COVID-19 declaration under Section 564(b)(1) of the Act, 21 U.S.C. section 360bbb-3(b)(1), unless the authorization is terminated or revoked sooner.    Influenza A by PCR NEGATIVE NEGATIVE Final   Influenza B by PCR NEGATIVE NEGATIVE Final    Comment: (NOTE) The Xpert Xpress SARS-CoV-2/FLU/RSV assay is intended as an aid in  the diagnosis of influenza from Nasopharyngeal swab specimens and  should not be used as a sole basis for treatment. Nasal washings and  aspirates are unacceptable for Xpert Xpress SARS-CoV-2/FLU/RSV  testing. Fact Sheet for Patients: PinkCheek.be Fact Sheet for Healthcare  Providers: GravelBags.it This test is not yet approved or cleared by the [place] FDA and  has been authorized for detection and/or diagnosis of SARS-CoV-2 by  FDA under an Emergency Use Authorization (EUA). This EUA will remain  in effect (meaning this test can be used) for the duration of the  Covid-19 declaration under Section 564(b)(1) of the Act, 21  U.S.C. section 360bbb-3(b)(1), unless the authorization is  terminated or revoked. Performed at [facility], [address] [place], [place] [address]   Urine Culture     Status: Abnormal   Collection Time: 04/01/19  2:54 PM   Specimen: Urine, Catheterized  Result Value Ref Range Status   Specimen Description   Final    URINE, CATHETERIZED Performed at [facility], [address], [place], [place] [address]    Special Requests   Final    NONE Performed at [facility], [address] [place], [place] [address]    Culture >=100,000 COLONIES/mL ESCHERICHIA COLI (A)  Final   Report Status 04/03/2019 FINAL  Final   Organism ID, Bacteria ESCHERICHIA COLI (A)  Final      Susceptibility  Escherichia coli - MIC*    AMPICILLIN >=32 RESISTANT Resistant     CEFAZOLIN <=4 SENSITIVE Sensitive     CEFTRIAXONE <=0.25 SENSITIVE Sensitive     CIPROFLOXACIN <=0.25 SENSITIVE Sensitive     GENTAMICIN <=1 SENSITIVE Sensitive     IMIPENEM <=0.25 SENSITIVE Sensitive     NITROFURANTOIN <=16 SENSITIVE Sensitive     TRIMETH/SULFA <=20 SENSITIVE Sensitive     AMPICILLIN/SULBACTAM >=32 RESISTANT Resistant     PIP/TAZO <=4 SENSITIVE Sensitive     * >=100,000 COLONIES/mL ESCHERICHIA COLI     Labs: BNP (last 3 results) Recent Labs    04/01/19 0953  BNP 49.2   Basic Metabolic Panel: Recent Labs  Lab 04/01/19 0954 04/02/19 0128  NA 138 139  K 5.0 4.3  CL 105 104  CO2 27 25  GLUCOSE 197* 134*  BUN 17 15  CREATININE 1.12* 0.86  CALCIUM 9.3 9.3  MG 1.9  --    Liver  Function Tests: Recent Labs  Lab 04/01/19 0954 04/01/19 1545 04/02/19 0128  AST '28 27 20  ' ALT '26 25 22  ' ALKPHOS 68 68 60  BILITOT 0.5 0.6 0.5  PROT 7.2 8.0 6.9  ALBUMIN 3.7 3.9 3.5   No results for input(s): LIPASE, AMYLASE in the last 168 hours. No results for input(s): AMMONIA in the last 168 hours. CBC: Recent Labs  Lab 04/01/19 0954 04/02/19 0128  WBC 5.3 10.2  NEUTROABS 3.5 6.4  HGB 11.6* 12.0  HCT 36.9 36.2  MCV 90.9 88.9  PLT 190 192   Cardiac Enzymes: Recent Labs  Lab 04/01/19 1546  CKTOTAL 101   BNP: Invalid input(s): POCBNP CBG: Recent Labs  Lab 04/02/19 1158 04/02/19 1617 04/02/19 1951 04/03/19 0757 04/03/19 1132  GLUCAP 148* 174* 130* 142* 173*   D-Dimer No results for input(s): DDIMER in the last 72 hours. Hgb A1c Recent Labs    04/01/19 1545  HGBA1C 7.3*   Lipid Profile No results for input(s): CHOL, HDL, LDLCALC, TRIG, CHOLHDL, LDLDIRECT in the last 72 hours. Thyroid function studies Recent Labs    04/01/19 1545  TSH 0.620   Anemia work up Recent Labs    04/01/19 1546  VITAMINB12 345   Urinalysis    Component Value Date/Time   COLORURINE YELLOW (A) 04/01/2019 1141   APPEARANCEUR CLEAR (A) 04/01/2019 1141   LABSPEC 1.013 04/01/2019 1141   PHURINE 5.0 04/01/2019 1141   GLUCOSEU NEGATIVE 04/01/2019 1141   HGBUR MODERATE (A) 04/01/2019 1141   BILIRUBINUR NEGATIVE 04/01/2019 1141   BILIRUBINUR neg 08/17/2018 0940   KETONESUR NEGATIVE 04/01/2019 1141   PROTEINUR 30 (A) 04/01/2019 1141   UROBILINOGEN 0.2 08/17/2018 0940   NITRITE NEGATIVE 04/01/2019 1141   LEUKOCYTESUR SMALL (A) 04/01/2019 1141   Sepsis Labs Invalid input(s): PROCALCITONIN,  WBC,  LACTICIDVEN Microbiology Recent Results (from the past 240 hour(s))  Respiratory Panel by RT PCR (Flu A&B, Covid) - Nasopharyngeal Swab     Status: None   Collection Time: 04/01/19 11:41 AM   Specimen: Nasopharyngeal Swab  Result Value Ref Range Status   SARS Coronavirus 2  by RT PCR NEGATIVE NEGATIVE Final    Comment: (NOTE) SARS-CoV-2 target nucleic acids are NOT DETECTED. The SARS-CoV-2 RNA is generally detectable in upper respiratoy specimens during the acute phase of infection. The lowest concentration of SARS-CoV-2 viral copies this assay can detect is 131 copies/mL. A negative result does not preclude SARS-Cov-2 infection and should not be used as the sole basis for treatment or other patient  management decisions. A negative result may occur with  improper specimen collection/handling, submission of specimen other than nasopharyngeal swab, presence of viral mutation(s) within the areas targeted by this assay, and inadequate number of viral copies (<131 copies/mL). A negative result must be combined with clinical observations, patient history, and epidemiological information. The expected result is Negative. Fact Sheet for Patients:  PinkCheek.be Fact Sheet for Healthcare Providers:  GravelBags.it This test is not yet ap proved or cleared by the [place] FDA and  has been authorized for detection and/or diagnosis of SARS-CoV-2 by FDA under an Emergency Use Authorization (EUA). This EUA will remain  in effect (meaning this test can be used) for the duration of the COVID-19 declaration under Section 564(b)(1) of the Act, 21 U.S.C. section 360bbb-3(b)(1), unless the authorization is terminated or revoked sooner.    Influenza A by PCR NEGATIVE NEGATIVE Final   Influenza B by PCR NEGATIVE NEGATIVE Final    Comment: (NOTE) The Xpert Xpress SARS-CoV-2/FLU/RSV assay is intended as an aid in  the diagnosis of influenza from Nasopharyngeal swab specimens and  should not be used as a sole basis for treatment. Nasal washings and  aspirates are unacceptable for Xpert Xpress SARS-CoV-2/FLU/RSV  testing. Fact Sheet for Patients: PinkCheek.be Fact Sheet for Healthcare  Providers: GravelBags.it This test is not yet approved or cleared by the [place] FDA and  has been authorized for detection and/or diagnosis of SARS-CoV-2 by  FDA under an Emergency Use Authorization (EUA). This EUA will remain  in effect (meaning this test can be used) for the duration of the  Covid-19 declaration under Section 564(b)(1) of the Act, 21  U.S.C. section 360bbb-3(b)(1), unless the authorization is  terminated or revoked. Performed at [facility], [address] [place], [place] [address]   Urine Culture     Status: Abnormal   Collection Time: 04/01/19  2:54 PM   Specimen: Urine, Catheterized  Result Value Ref Range Status   Specimen Description   Final    URINE, CATHETERIZED Performed at [facility], [address] [place], [place] [address]    Special Requests   Final    NONE Performed at [facility], [place], [place] [address]    Culture >=100,000 COLONIES/mL ESCHERICHIA COLI (A)  Final   Report Status 04/03/2019 FINAL  Final   Organism ID, Bacteria ESCHERICHIA COLI (A)  Final      Susceptibility   Escherichia coli - MIC*    AMPICILLIN >=32 RESISTANT Resistant     CEFAZOLIN <=4 SENSITIVE Sensitive     CEFTRIAXONE <=0.25 SENSITIVE Sensitive     CIPROFLOXACIN <=0.25 SENSITIVE Sensitive     GENTAMICIN <=1 SENSITIVE Sensitive     IMIPENEM <=0.25 SENSITIVE Sensitive     NITROFURANTOIN <=16 SENSITIVE Sensitive     TRIMETH/SULFA <=20 SENSITIVE Sensitive     AMPICILLIN/SULBACTAM >=32 RESISTANT Resistant     PIP/TAZO <=4 SENSITIVE Sensitive     * >=100,000 COLONIES/mL ESCHERICHIA COLI    Time coordinating discharge: Over 30 minutes  SIGNED:  Lorella Nimrod, MD  [facility] 04/03/2019, 12:58 PM  If 7PM-7AM, please contact night-coverage [email or web address]  This record has been created using Systems analyst. Errors have been sought and corrected,but may not  always be located. Such creation errors do not reflect on the standard of care.

## 2019-04-03 NOTE — Progress Notes (Deleted)
Progress Note  Patient Name: [name] Date of Encounter: 04/03/2019  Primary Cardiologist: Yvonne Kendall, MD  Subjective   No c/p or shortness of breath.  She denies any prior h/o of either as well.  Inpatient Medications    Scheduled Meds: . Chlorhexidine Gluconate Cloth  6 each Topical Daily  . heparin  5,000 Units Subcutaneous Q8H  . lisinopril  10 mg Oral Daily   And  . hydrochlorothiazide  12.5 mg Oral Daily  . influenza vaccine adjuvanted  0.5 mL Intramuscular Tomorrow-1000  . insulin aspart  0-15 Units Subcutaneous TID WC  . insulin aspart  0-5 Units Subcutaneous QHS  . pneumococcal 23 valent vaccine  0.5 mL Intramuscular Tomorrow-1000  . pravastatin  40 mg Oral Daily  . sodium chloride flush  10-40 mL Intracatheter Q12H   Continuous Infusions: . cefTRIAXone (ROCEPHIN)  IV Stopped (04/02/19 1557)  . levETIRAcetam 1,000 mg (04/02/19 2132)   PRN Meds: LORazepam   Vital Signs    Vitals:   04/02/19 1038 04/02/19 1159 04/02/19 2012 04/03/19 0504  BP: 133/66 124/71 128/79 122/71  Pulse: 71 65 71 64  Resp:   20 20  Temp:  98.6 F (37 C) 99.9 F (37.7 C) 98.7 F (37.1 C)  TempSrc:  Oral Oral Oral  SpO2: 100% 100% 100% 100%  Weight:      Height:        Intake/Output Summary (Last 24 hours) at 04/03/2019 0900 Last data filed at 04/03/2019 0509 Gross per 24 hour  Intake 1040 ml  Output 1800 ml  Net -760 ml   Filed Weights   04/01/19 0923  Weight: 81.6 kg    Physical Exam   GEN: Well nourished, well developed, in no acute distress.  HEENT: Grossly normal.  Neck: Supple, no JVD, carotid bruits, or masses. Cardiac: RRR, no murmurs, rubs, or gallops. No clubbing, cyanosis.  Woody, chronic venous stasis changes to bilat lower extremities. No significant edema. Radials/DP/PT 1+ and equal bilaterally.  Respiratory:  Respirations regular and unlabored, clear to auscultation bilaterally. GI: Soft, nontender, nondistended, BS + x 4. MS: no  deformity or atrophy. Skin: warm and dry, no rash. Neuro:  Strength and sensation are intact. Psych: AAOx3.  Normal affect.  Labs    Chemistry Recent Labs  Lab 04/01/19 0954 04/01/19 1545 04/02/19 0128  NA 138  --  139  K 5.0  --  4.3  CL 105  --  104  CO2 27  --  25  GLUCOSE 197*  --  134*  BUN 17  --  15  CREATININE 1.12*  --  0.86  CALCIUM 9.3  --  9.3  PROT 7.2 8.0 6.9  ALBUMIN 3.7 3.9 3.5  AST 28 27 20   ALT 26 25 22   ALKPHOS 68 68 60  BILITOT 0.5 0.6 0.5  GFRNONAA 47*  --  >60  GFRAA 54*  --  >60  ANIONGAP 6  --  10     Hematology Recent Labs  Lab 04/01/19 0954 04/02/19 0128  WBC 5.3 10.2  RBC 4.06 4.07  HGB 11.6* 12.0  HCT 36.9 36.2  MCV 90.9 88.9  MCH 28.6 29.5  MCHC 31.4 33.1  RDW 14.2 14.1  PLT 190 192    Cardiac Enzymes  Recent Labs  Lab 04/01/19 1545 04/01/19 2015 04/01/19 2126 04/01/19 2315 04/02/19 0128  TROPONINIHS 137* 147* 146* 147* 142*      BNP Recent Labs  Lab 04/01/19 0953  BNP 83.0  Radiology    EEG  Result Date: 04/01/2019 Charlsie Quest, MD     04/01/2019  2:41 PM Patient Name: Zaliah Wissner MRN: [id number] Epilepsy Attending: Charlsie Quest Referring Physician/Provider: Dr. Georgiana Spinner Aroor Date: 04/01/2019 Duration: 22.27 minutes Patient history: 79 year old female with past medical history of "brain bleed in 2015" and seizures who presented after multiple witnessed generalized tonic-clonic seizure-like episode at home.  EEG to evaluate for seizures. Level of alertness: awake AEDs during EEG study: keppra Technical aspects: This EEG study was done with scalp electrodes positioned according to the 10-20 International system of electrode placement. Electrical activity was acquired at a sampling rate of 500Hz  and reviewed with a high frequency filter of 70Hz  and a low frequency filter of 1Hz . EEG data were recorded continuously and digitally stored. DESCRIPTION: EEG showed continuous generalized 2 to 3 Hz delta  activity in left hemisphere.  EEG also showed 8 Hz alpha activity in the right hemisphere as well as intermittent generalized 2 to 3 Hz delta activity. No clear posterior dominant rhythm was seen.  Hyperventilation and photic stimulation were not performed. Abnormality -Continuous slow, left hemisphere -Intermittent slow, generalized IMPRESSION: This study is suggestive of cortical dysfunction in the left hemisphere which is nonspecific to etiology but could be secondary to postictal state.  Additionally, there is evidence of mild diffuse encephalopathy.No seizures or epileptiform discharges were seen throughout the recording.   CT Head Wo Contrast  Result Date: 04/01/2019 CLINICAL DATA:  Encephalopathy and seizure.  Abnormal neuro exam. EXAM: CT HEAD WITHOUT CONTRAST TECHNIQUE: Contiguous axial images were obtained from the base of the skull through the vertex without intravenous contrast. COMPARISON:  None. FINDINGS: Brain: No evidence of acute infarction, hemorrhage, hydrocephalus, extra-axial collection or mass lesion/mass effect. Moderate low-density in the cerebral white matter attributed to chronic small vessel ischemia. There is cortical atrophy that is generalized. No focal cortical finding to explain seizure history. Vascular: No hyperdense vessel or unexpected calcification. Skull: No acute or aggressive finding Sinuses/Orbits: Mucosal thickening in the right nasal cavity where there is concha bullosa, incompletely covered. Negative orbits IMPRESSION: 1. No acute or reversible finding. 2. Atrophy and moderate chronic small vessel ischemia. Electronically Signed   By: M.D.   On: 04/01/2019 10:23   CT ANGIO CHEST PE W OR WO CONTRAST  Result Date: 04/01/2019 CLINICAL DATA:  Elevated D-dimer. Altered mental status. EXAM: CT ANGIOGRAPHY CHEST WITH CONTRAST TECHNIQUE: Multidetector CT imaging of the chest was performed using the standard protocol during bolus  administration of intravenous contrast. Multiplanar CT image reconstructions and MIPs were obtained to evaluate the vascular anatomy. CONTRAST:  62mL OMNIPAQUE IOHEXOL 350 MG/ML SOLN COMPARISON:  Radiograph earlier this day. FINDINGS: Cardiovascular: There are no filling defects within the pulmonary arteries to suggest pulmonary embolus. Mild aortic atherosclerosis. No evidence of dissection. Upper normal heart size. No pericardial effusion. Mediastinum/Nodes: Small mediastinal and hilar lymph nodes not enlarged by size criteria. Small hiatal hernia. No esophageal wall thickening. No thyroid nodule. Lungs/Pleura: Mild heterogeneous pulmonary parenchyma with the ground-glass opacities. Mild dependent atelectasis in both lower lobes. No septal thickening. 4 mm subpleural nodule in the right upper lobe series 6, image 28. Trachea and mainstem bronchi are patent. No pleural fluid. Upper Abdomen: Suspected hepatic steatosis. No acute findings. Musculoskeletal: Remote sternal fracture. Mild flattening of T6 vertebral body appears chronic. There are no acute or suspicious osseous abnormalities. Review of the MIP images confirms the above findings. IMPRESSION: 1. No pulmonary embolus.  2. Mild heterogeneous pulmonary parenchyma with ground-glass opacities, may be related to air trapping or small airways disease. There is likely associated vascular congestion. 3. Right upper lobe 4 mm subpleural nodule. No follow-up needed if patient is low-risk. Non-contrast chest CT can be considered in 12 months if patient is high-risk. This recommendation follows the consensus statement: Guidelines for Management of Incidental Pulmonary Nodules Detected on CT Images: From the Fleischner Society 2017; Radiology 2017; 284:228-243. Aortic Atherosclerosis (ICD10-I70.0). Electronically Signed   By: Keith Rake M.D.   On: 04/01/2019 21:35   MR BRAIN WO CONTRAST  Result Date: 04/02/2019 CLINICAL DATA:  Hypertension, hyperlipidemia,  prior history of ICH, CVA and seizure disorder. EXAM: MRI HEAD WITHOUT CONTRAST TECHNIQUE: Multiplanar, multiecho pulse sequences of the brain and surrounding structures were obtained without intravenous contrast. COMPARISON:  CT of the head April 01, 2019 FINDINGS: The study is partially degraded by motion. Brain: No acute infarction, hemorrhage, hydrocephalus, extra-axial collection or mass lesion. Remote lacunar infarcts are seen in the bilateral cerebellar hemispheres, basal ganglia and thalami. Scattered and confluent T2 hyperintensity within the white matter of the cerebral hemispheres nonspecific, most likely related to chronic small vessel ischemia. There is prominence of the supratentorial ventricles and cerebral sulci, reflecting parenchymal volume loss. There is marked cerebella volume loss. This can be seen in long-term use of Phenytoin. Vascular: Normal flow voids. Skull and upper cervical spine: Normal marrow signal. Sinuses/Orbits: Negative. Other: None. IMPRESSION: 1. No acute intracranial abnormality. 2. Moderate to advanced chronic white matter disease. 3. Marked cerebellar parenchymal volume loss. This can be seen in long-term use of Phenytoin. Electronically Signed   By: Pedro Earls M.D.   On: 04/02/2019 13:34   DG Chest Portable 1 View  Result Date: 04/01/2019 CLINICAL DATA:  Seizures EXAM: PORTABLE CHEST 1 VIEW COMPARISON:  None. FINDINGS: Mild pulmonary edema. No pleural effusion or pneumothorax. Top-normal heart size. IMPRESSION: Mild pulmonary edema. Electronically Signed   By: Macy Mis M.D.   On: 04/01/2019 11:05   Telemetry    Sinus rhythm, rare PVCs, brief run of atrial tachycardia - Personally Reviewed  Cardiac Studies   2D Echocardiogram 2.23.2021   1. Left ventricular ejection fraction, by estimation, is 55 to 60%. The  left ventricle has normal function. The left ventricle has no regional  wall motion abnormalities. There is moderate left  ventricular hypertrophy.  Left ventricular diastolic  parameters are consistent with Grade II diastolic dysfunction  (pseudonormalization). Elevated left atrial pressure.   2. Right ventricular systolic function is mildly reduced. The right  ventricular size is mildly enlarged. Tricuspid regurgitation signal is  inadequate for assessing PA pressure.   3. Left atrial size was mildly dilated.   4. Right atrial size was mildly dilated.   5. The mitral valve is degenerative. Mild mitral valve regurgitation. No  evidence of mitral stenosis.   6. The aortic valve is tricuspid. Aortic valve regurgitation is not  visualized. Mild aortic valve sclerosis is present, with no evidence of  aortic valve stenosis.    Patient Profile     79 y.o. female w/ a h/o seizures, DMII, HTN, HL, PVD, cellulitis, and self-reported brain bleed, who was admitted 2/22 following seizure and loss of consciousness, and was noted to have HsTrop elevation w/ flat trend (137  147   146  147  142).  Assessment & Plan    1.  Seizure Disorder/Loss of consciousness:  Admitted 2/22 following witnessed seizure and loss of consciousness felt  to be most consistent w/ postictal state.  No significant events on tele.  Nl EF by echo.  Mgmt per neuro/IM.  2.  Elevated HsTrop:  Minimal, flat HsTrop elevation in the setting of #1 (137  147  146  147  142).  ECG w/o acute ischemic changes.  No h/o c/p or dyspnea.  CT chest w/ mild cor Ca2+. Echo w/ nl LVEF.  HsTrop elevation likely 2/2 demand ischemia in setting of #1.  No further inpt cardiac eval warranted.  Cont statin Rx in setting of DM/HL/Cor Ca2+.  Will consider outpt ischemic eval.  3.  Essential HTN:  Stable on lisinopril-HCTZ.  LVH and Gr2 DD noted on echo.  If not prev eval, would benefit from outpt sleep study.  4.  HL:  LDL 81 in 08/2018.  Cont statin Rx.  5.  Right upper lobe 4 mm subpleural nodule: noted on CTA chest.  Smoked for 50 years.  Would benefit from outpt f/u in  pulmonary nodule clinic w/ annual low-rad chest CT.  6.  UTI:  GNR on culture.  Rocephin per IM.  [facility] will sign off.   Medication Recommendations:  As above. Consider ASA 81 daily if ok w/ neuro. Other recommendations (labs, testing, etc):  None Follow up as an outpatient:  We will arrange for outpt cardiology f/u and consider outpt ischemic stress testing given RF, mild HsTrop elevation, and Cor Ca2+.  Signed, Nicolasa Ducking, NP  04/03/2019, 9:00 AM    For questions or updates, please contact   Please consult [email or web address] for contact info under Cardiology/STEMI.

## 2019-04-03 NOTE — Progress Notes (Signed)
Reason for consult:   Subjective: Patient is awake, alert and oriented.  Following commands.  Eager to go home.   ROS: negative except above  Examination  Vital signs in last 24 hours: Temp:  [98.4 F (36.9 C)-99.9 F (37.7 C)] 98.4 F (36.9 C) (02/24 1134) Pulse Rate:  [64-71] 65 (02/24 1134) Resp:  [18-20] 18 (02/24 1134) BP: (122-137)/(71-79) 137/71 (02/24 1134) SpO2:  [98 %-100 %] 98 % (02/24 1134)  General: lying in bed CVS: pulse-normal rate and rhythm RS: breathing comfortably Extremities: normal   Neuro: MS: Alert, oriented, follows commands CN: pupils equal and reactive,  EOMI, face symmetric, tongue midline, normal sensation over face, Motor: 5/5 strength in all 4 extremities Reflexes: 2+ bilaterally over patella, biceps, plantars: flexor Coordination: normal Gait: not tested  Basic Metabolic Panel: Recent Labs  Lab 04/01/19 0954 04/02/19 0128  NA 138 139  K 5.0 4.3  CL 105 104  CO2 27 25  GLUCOSE 197* 134*  BUN 17 15  CREATININE 1.12* 0.86  CALCIUM 9.3 9.3  MG 1.9  --     CBC: Recent Labs  Lab 04/01/19 0954 04/02/19 0128  WBC 5.3 10.2  NEUTROABS 3.5 6.4  HGB 11.6* 12.0  HCT 36.9 36.2  MCV 90.9 88.9  PLT 190 192     Coagulation Studies: No results for input(s): LABPROT, INR in the last 72 hours.  EEG 04/01/19 IMPRESSION: This study is suggestive of cortical dysfunction in the left hemisphere which is nonspecific to etiology but could be secondary to postictal state.  Additionally, there is evidence of mild diffuse encephalopathy.No seizures or epileptiform discharges were seen throughout the recording.   ASSESSMENT AND PLAN 70-year female with history of seizures, ICH, memory loss presents to the ED with multiple witnessed seizures.On assessment yesterday, patient appeared postictal with Todd's paresis. Stat EEG negative for seizures, showed left-sided cortical dysfunction.  Patient was given extra 1 g of Keppra-total 2 g and started  on 1 g twice daily. MRI brain obtained-no acute abnormality.  Provoking factor likely UTI and she has history of ICH.  Since this is her second seizure, given her history of intracerebral hemorrhage I would recommend continuing Keppra 1g BID, which I suspect should will need to take lifelong.  Can potentially reduce the dose on subsequent follow-up  Status epilepticus: resolved  H/o of ICH  Todd's paresis: resolved Urinary tract infection  Recommendations Continue Keppra 1g BID, can switch to PO Seizure precautions, no drivingx 6 months  Antibiotics for UTI per primary team    Needs outpatient neurology follow up-please refer to Dr. Malvin Johns was seen her before for memory loss.   Per [facility] statutes, patients with seizures are not allowed to drive until they have been seizure-free for six months. Use caution when using heavy equipment or power tools. Avoid working on ladders or at heights. Take showers instead of baths. Ensure the water temperature is not too high on the home water heater. Do not go swimming alone. Do not lock yourself in a room alone (i.e. bathroom). When caring for infants or small children, sit down when holding, feeding, or changing them to minimize risk of injury to the child in the event you have a seizure. Maintain good sleep hygiene. Avoid alcohol.    If [name] has another seizure, call 911 and bring them back to the ED if: A. The seizure lasts longer than 5 minutes.  B. The patient doesn't wake shortly after the seizure or has new  problems such as difficulty seeing, speaking or moving following the seizure C. The patient was injured during the seizure D. The patient has a temperature over 102 F (39C) E. The patient vomited during the seizure and now is having trouble breathing      [name] [facility] Pager Number [phone] For questions after 7pm please refer to  AMION to reach the Neurologist on call

## 2019-04-03 NOTE — Progress Notes (Signed)
Progress Note  Patient Name: [name] Date of Encounter: 04/03/2019  Primary Cardiologist: Nelva Bush, MD   Subjective   Patient states doing okay.  Denies chest pain or shortness of breath.  Has history of seizures.  Inpatient Medications    Scheduled Meds: . Chlorhexidine Gluconate Cloth  6 each Topical Daily  . heparin  5,000 Units Subcutaneous Q8H  . lisinopril  10 mg Oral Daily   And  . hydrochlorothiazide  12.5 mg Oral Daily  . influenza vaccine adjuvanted  0.5 mL Intramuscular Tomorrow-1000  . insulin aspart  0-15 Units Subcutaneous TID WC  . insulin aspart  0-5 Units Subcutaneous QHS  . pneumococcal 23 valent vaccine  0.5 mL Intramuscular Tomorrow-1000  . pravastatin  40 mg Oral Daily  . sodium chloride flush  10-40 mL Intracatheter Q12H   Continuous Infusions: . cefTRIAXone (ROCEPHIN)  IV Stopped (04/02/19 1557)  . levETIRAcetam 1,000 mg (04/02/19 2132)   PRN Meds: LORazepam   Vital Signs    Vitals:   04/02/19 1038 04/02/19 1159 04/02/19 2012 04/03/19 0504  BP: 133/66 124/71 128/79 122/71  Pulse: 71 65 71 64  Resp:   20 20  Temp:  98.6 F (37 C) 99.9 F (37.7 C) 98.7 F (37.1 C)  TempSrc:  Oral Oral Oral  SpO2: 100% 100% 100% 100%  Weight:      Height:        Intake/Output Summary (Last 24 hours) at 04/03/2019 0906 Last data filed at 04/03/2019 0509 Gross per 24 hour  Intake 1040 ml  Output 1800 ml  Net -760 ml   Last 3 Weights 04/01/2019 08/17/2018 07/16/2018  Weight (lbs) 180 lb 210 lb 208 lb 12.8 oz  Weight (kg) 81.647 kg 95.255 kg 94.711 kg      Telemetry    Sinus bradycardia, heart rate 55, nonsustained VT, 6 beats.- Personally Reviewed  ECG    No new tracing today  Physical Exam   GEN: No acute distress.   Neck: No JVD Cardiac: RRR, no murmurs, rubs, or gallops.  Respiratory: Clear to auscultation bilaterally. GI: Soft, nontender, non-distended  MS: No edema; No deformity. Neuro:  Nonfocal  Psych: Normal affect     Labs    High Sensitivity Troponin:   Recent Labs  Lab 04/01/19 1545 04/01/19 2015 04/01/19 2126 04/01/19 2315 04/02/19 0128  TROPONINIHS 137* 147* 146* 147* 142*      Chemistry Recent Labs  Lab 04/01/19 0954 04/01/19 1545 04/02/19 0128  NA 138  --  139  K 5.0  --  4.3  CL 105  --  104  CO2 27  --  25  GLUCOSE 197*  --  134*  BUN 17  --  15  CREATININE 1.12*  --  0.86  CALCIUM 9.3  --  9.3  PROT 7.2 8.0 6.9  ALBUMIN 3.7 3.9 3.5  AST 28 27 20   ALT 26 25 22   ALKPHOS 68 68 60  BILITOT 0.5 0.6 0.5  GFRNONAA 47*  --  >60  GFRAA 54*  --  >60  ANIONGAP 6  --  10     Hematology Recent Labs  Lab 04/01/19 0954 04/02/19 0128  WBC 5.3 10.2  RBC 4.06 4.07  HGB 11.6* 12.0  HCT 36.9 36.2  MCV 90.9 88.9  MCH 28.6 29.5  MCHC 31.4 33.1  RDW 14.2 14.1  PLT 190 192    BNP Recent Labs  Lab 04/01/19 0953  BNP 83.0     DDimer No  results for input(s): DDIMER in the last 168 hours.   Radiology    EEG  Result Date: 04/01/2019 Charlsie Quest, MD     04/01/2019  2:41 PM Patient Name: [name] MRN: [id number] Epilepsy Attending: Charlsie Quest Referring Physician/Provider: Dr. Georgiana Spinner Aroor Date: 04/01/2019 Duration: 22.27 minutes Patient history: 79 year old female with past medical history of "brain bleed in 2015" and seizures who presented after multiple witnessed generalized tonic-clonic seizure-like episode at home.  EEG to evaluate for seizures. Level of alertness: awake AEDs during EEG study: keppra Technical aspects: This EEG study was done with scalp electrodes positioned according to the 10-20 International system of electrode placement. Electrical activity was acquired at a sampling rate of 500Hz  and reviewed with a high frequency filter of 70Hz  and a low frequency filter of 1Hz . EEG data were recorded continuously and digitally stored. DESCRIPTION: EEG showed continuous generalized 2 to 3 Hz delta activity in left hemisphere.  EEG also showed 8 Hz  alpha activity in the right hemisphere as well as intermittent generalized 2 to 3 Hz delta activity. No clear posterior dominant rhythm was seen.  Hyperventilation and photic stimulation were not performed. Abnormality -Continuous slow, left hemisphere -Intermittent slow, generalized IMPRESSION: This study is suggestive of cortical dysfunction in the left hemisphere which is nonspecific to etiology but could be secondary to postictal state.  Additionally, there is evidence of mild diffuse encephalopathy.No seizures or epileptiform discharges were seen throughout the recording.   CT Head Wo Contrast  Result Date: 04/01/2019 CLINICAL DATA:  Encephalopathy and seizure.  Abnormal neuro exam. EXAM: CT HEAD WITHOUT CONTRAST TECHNIQUE: Contiguous axial images were obtained from the base of the skull through the vertex without intravenous contrast. COMPARISON:  None. FINDINGS: Brain: No evidence of acute infarction, hemorrhage, hydrocephalus, extra-axial collection or mass lesion/mass effect. Moderate low-density in the cerebral white matter attributed to chronic small vessel ischemia. There is cortical atrophy that is generalized. No focal cortical finding to explain seizure history. Vascular: No hyperdense vessel or unexpected calcification. Skull: No acute or aggressive finding Sinuses/Orbits: Mucosal thickening in the right nasal cavity where there is concha bullosa, incompletely covered. Negative orbits IMPRESSION: 1. No acute or reversible finding. 2. Atrophy and moderate chronic small vessel ischemia. Electronically Signed   By: M.D.   On: 04/01/2019 10:23   CT ANGIO CHEST PE W OR WO CONTRAST  Result Date: 04/01/2019 CLINICAL DATA:  Elevated D-dimer. Altered mental status. EXAM: CT ANGIOGRAPHY CHEST WITH CONTRAST TECHNIQUE: Multidetector CT imaging of the chest was performed using the standard protocol during bolus administration of intravenous contrast. Multiplanar CT image  reconstructions and MIPs were obtained to evaluate the vascular anatomy. CONTRAST:  26mL OMNIPAQUE IOHEXOL 350 MG/ML SOLN COMPARISON:  Radiograph earlier this day. FINDINGS: Cardiovascular: There are no filling defects within the pulmonary arteries to suggest pulmonary embolus. Mild aortic atherosclerosis. No evidence of dissection. Upper normal heart size. No pericardial effusion. Mediastinum/Nodes: Small mediastinal and hilar lymph nodes not enlarged by size criteria. Small hiatal hernia. No esophageal wall thickening. No thyroid nodule. Lungs/Pleura: Mild heterogeneous pulmonary parenchyma with the ground-glass opacities. Mild dependent atelectasis in both lower lobes. No septal thickening. 4 mm subpleural nodule in the right upper lobe series 6, image 28. Trachea and mainstem bronchi are patent. No pleural fluid. Upper Abdomen: Suspected hepatic steatosis. No acute findings. Musculoskeletal: Remote sternal fracture. Mild flattening of T6 vertebral body appears chronic. There are no acute or suspicious osseous abnormalities. Review of the  MIP images confirms the above findings. IMPRESSION: 1. No pulmonary embolus. 2. Mild heterogeneous pulmonary parenchyma with ground-glass opacities, may be related to air trapping or small airways disease. There is likely associated vascular congestion. 3. Right upper lobe 4 mm subpleural nodule. No follow-up needed if patient is low-risk. Non-contrast chest CT can be considered in 12 months if patient is high-risk. This recommendation follows the consensus statement: Guidelines for Management of Incidental Pulmonary Nodules Detected on CT Images: From the Fleischner Society 2017; Radiology 2017; 284:228-243. Aortic Atherosclerosis (ICD10-I70.0). Electronically Signed   By: Narda Rutherford M.D.   On: 04/01/2019 21:35   MR BRAIN WO CONTRAST  Result Date: 04/02/2019 CLINICAL DATA:  Hypertension, hyperlipidemia, prior history of ICH, CVA and seizure disorder. EXAM: MRI HEAD  WITHOUT CONTRAST TECHNIQUE: Multiplanar, multiecho pulse sequences of the brain and surrounding structures were obtained without intravenous contrast. COMPARISON:  CT of the head April 01, 2019 FINDINGS: The study is partially degraded by motion. Brain: No acute infarction, hemorrhage, hydrocephalus, extra-axial collection or mass lesion. Remote lacunar infarcts are seen in the bilateral cerebellar hemispheres, basal ganglia and thalami. Scattered and confluent T2 hyperintensity within the white matter of the cerebral hemispheres nonspecific, most likely related to chronic small vessel ischemia. There is prominence of the supratentorial ventricles and cerebral sulci, reflecting parenchymal volume loss. There is marked cerebella volume loss. This can be seen in long-term use of Phenytoin. Vascular: Normal flow voids. Skull and upper cervical spine: Normal marrow signal. Sinuses/Orbits: Negative. Other: None. IMPRESSION: 1. No acute intracranial abnormality. 2. Moderate to advanced chronic white matter disease. 3. Marked cerebellar parenchymal volume loss. This can be seen in long-term use of Phenytoin. Electronically Signed   By: Baldemar Lenis M.D.   On: 04/02/2019 13:34   DG Chest Portable 1 View  Result Date: 04/01/2019 CLINICAL DATA:  Seizures EXAM: PORTABLE CHEST 1 VIEW COMPARISON:  None. FINDINGS: Mild pulmonary edema. No pleural effusion or pneumothorax. Top-normal heart size. IMPRESSION: Mild pulmonary edema. Electronically Signed   By: Guadlupe Spanish M.D.   On: 04/01/2019 11:05   ECHOCARDIOGRAM COMPLETE  Result Date: 04/03/2019    ECHOCARDIOGRAM REPORT   Patient Name:   [name] Date of Exam: 04/02/2019 Medical Rec #:  [id number]          Height:       68.0 in Accession #:    [id number]         Weight:       180.0 lb Date of Birth:  [birth year]          BSA:          1.954 m Patient Age:    78 years           BP:           124/71 mmHg Patient Gender: F                  HR:            65 bpm. Exam Location:  [facility] Procedure: 2D Echo, Cardiac Doppler and Color Doppler Indications:     NSTEMI 121.4  History:         Patient has no prior history of Echocardiogram examinations.                  Risk Factors:Hypertension and Diabetes. PVD.  Sonographer:     Cristela Blue RDCS (AE) Referring Phys:  [id number] Gertha Calkin Diagnosing Phys: Yvonne Kendall MD IMPRESSIONS  1. Left ventricular ejection fraction, by estimation, is 55 to 60%. The left ventricle has normal function. The left ventricle has no regional wall motion abnormalities. There is moderate left ventricular hypertrophy. Left ventricular diastolic parameters are consistent with Grade II diastolic dysfunction (pseudonormalization). Elevated left atrial pressure.  2. Right ventricular systolic function is mildly reduced. The right ventricular size is mildly enlarged. Tricuspid regurgitation signal is inadequate for assessing PA pressure.  3. Left atrial size was mildly dilated.  4. Right atrial size was mildly dilated.  5. The mitral valve is degenerative. Mild mitral valve regurgitation. No evidence of mitral stenosis.  6. The aortic valve is tricuspid. Aortic valve regurgitation is not visualized. Mild aortic valve sclerosis is present, with no evidence of aortic valve stenosis. FINDINGS  Left Ventricle: Left ventricular ejection fraction, by estimation, is 55 to 60%. The left ventricle has normal function. The left ventricle has no regional wall motion abnormalities. The left ventricular internal cavity size was normal in size. There is  moderate left ventricular hypertrophy. Left ventricular diastolic parameters are consistent with Grade II diastolic dysfunction (pseudonormalization). Elevated left atrial pressure. Right Ventricle: The right ventricular size is mildly enlarged. No increase in right ventricular wall thickness. Right ventricular systolic function is mildly reduced. Tricuspid regurgitation signal is inadequate for assessing  PA pressure. Left Atrium: Left atrial size was mildly dilated. Right Atrium: Right atrial size was mildly dilated. Pericardium: There is no evidence of pericardial effusion. Mitral Valve: The mitral valve is degenerative in appearance. There is moderate thickening of the mitral valve leaflet(s). There is mild calcification of the mitral valve leaflet(s). Mild mitral valve regurgitation. No evidence of mitral valve stenosis. Tricuspid Valve: The tricuspid valve is normal in structure. Tricuspid valve regurgitation is mild. Aortic Valve: The aortic valve is tricuspid. . There is mild thickening and mild calcification of the aortic valve. Aortic valve regurgitation is not visualized. Mild aortic valve sclerosis is present, with no evidence of aortic valve stenosis. There is mild thickening of the aortic valve. There is mild calcification of the aortic valve. Aortic valve mean gradient measures 4.5 mmHg. Aortic valve peak gradient measures 8.1 mmHg. Aortic valve area, by VTI measures 2.61 cm. Pulmonic Valve: The pulmonic valve was not well visualized. Pulmonic valve regurgitation is not visualized. No evidence of pulmonic stenosis. Aorta: The aortic root is normal in size and structure. Pulmonary Artery: The pulmonary artery is not well seen. Venous: The inferior vena cava was not well visualized. IAS/Shunts: The interatrial septum was not well visualized.  LEFT VENTRICLE PLAX 2D LVIDd:         4.13 cm  Diastology LVIDs:         2.22 cm  LV e' lateral:   8.49 cm/s LV PW:         1.39 cm  LV E/e' lateral: 12.0 LV IVS:        1.34 cm  LV e' medial:    5.11 cm/s LVOT diam:     2.00 cm  LV E/e' medial:  20.0 LV SV:         81 LV SV Index:   41 LVOT Area:     3.14 cm  RIGHT VENTRICLE RV Basal diam:  4.43 cm RV S prime:     7.72 cm/s TAPSE (M-mode): 1.6 cm LEFT ATRIUM             Index       RIGHT ATRIUM  Index LA diam:        4.20 cm 2.15 cm/m  RA Area:     18.40 cm LA Vol (A2C):   74.0 ml 37.87 ml/m RA Volume:    49.00 ml  25.07 ml/m LA Vol (A4C):   80.9 ml 41.40 ml/m LA Biplane Vol: 77.9 ml 39.86 ml/m  AORTIC VALVE                   PULMONIC VALVE AV Area (Vmax):    1.73 cm    PV Vmax:        0.90 m/s AV Area (Vmean):   1.56 cm    PV Peak grad:   3.2 mmHg AV Area (VTI):     2.61 cm    RVOT Peak grad: 2 mmHg AV Vmax:           142.50 cm/s AV Vmean:          96.450 cm/s AV VTI:            0.310 m AV Peak Grad:      8.1 mmHg AV Mean Grad:      4.5 mmHg LVOT Vmax:         78.30 cm/s LVOT Vmean:        48.000 cm/s LVOT VTI:          0.257 m LVOT/AV VTI ratio: 0.83  AORTA Ao Root diam: 2.90 cm MITRAL VALVE MV Area (PHT): 3.99 cm     SHUNTS MV Decel Time: 190 msec     Systemic VTI:  0.26 m MV E velocity: 102.00 cm/s  Systemic Diam: 2.00 cm MV A velocity: 75.20 cm/s MV E/A ratio:  1.36 Yvonne Kendallhristopher End MD Electronically signed by Yvonne Kendallhristopher End MD Signature Date/Time: 04/03/2019/7:00:55 AM    Final     Cardiac Studies   TTE -04/02/2019 1. Left ventricular ejection fraction, by estimation, is 55 to 60%. The  left ventricle has normal function. The left ventricle has no regional  wall motion abnormalities. There is moderate left ventricular hypertrophy.  Left ventricular diastolic  parameters are consistent with Grade II diastolic dysfunction  (pseudonormalization). Elevated left atrial pressure.  2. Right ventricular systolic function is mildly reduced. The right  ventricular size is mildly enlarged. Tricuspid regurgitation signal is  inadequate for assessing PA pressure.  3. Left atrial size was mildly dilated.  4. Right atrial size was mildly dilated.  5. The mitral valve is degenerative. Mild mitral valve regurgitation. No  evidence of mitral stenosis.  6. The aortic valve is tricuspid. Aortic valve regurgitation is not  visualized. Mild aortic valve sclerosis is present, with no evidence of  aortic valve stenosis.   Patient Profile     79 y.o. female with history of seizure disorder,  hypertension, hyperlipidemia, diabetes presenting with altered mental status found to have elevated troponin with clinical sequelae consistent with seizure disorder.  Assessment & Plan    1.  Elevated troponin -Minimally elevated troponins likely is due to demand ischemia -She has no symptoms to suggest ACS. -Transthoracic echocardiogram shows normal ejection fraction with no wall motion abnormalities. -No further cardiac work-up indicated at this poin -One episode of ventricular ectopy, monitor and replete electrolytes keeping magnesium over 2, K.  2.  Grade 2 diastolic dysfunction -Moderate LVH -Likely due to history of hypertension -Patient currently is euvolemic -Blood pressure is well controlled.    3.  Hypertension -Continue current meds.  Blood pressure is well controlled.  Let us know  if further input is needed.  Thank you for this consult.      Signed, Debbe Odea, MD  04/03/2019, 9:06 AM

## 2019-04-08 ENCOUNTER — Other Ambulatory Visit: Payer: Self-pay

## 2019-04-08 ENCOUNTER — Ambulatory Visit (INDEPENDENT_AMBULATORY_CARE_PROVIDER_SITE_OTHER): Payer: Medicare HMO | Admitting: Internal Medicine

## 2019-04-08 ENCOUNTER — Encounter: Payer: Self-pay | Admitting: Internal Medicine

## 2019-04-08 VITALS — BP 128/78 | HR 74 | Ht 69.0 in | Wt 203.0 lb

## 2019-04-08 DIAGNOSIS — R778 Other specified abnormalities of plasma proteins: Secondary | ICD-10-CM

## 2019-04-08 DIAGNOSIS — R55 Syncope and collapse: Secondary | ICD-10-CM | POA: Diagnosis not present

## 2019-04-08 DIAGNOSIS — I10 Essential (primary) hypertension: Secondary | ICD-10-CM | POA: Diagnosis not present

## 2019-04-08 DIAGNOSIS — R569 Unspecified convulsions: Secondary | ICD-10-CM | POA: Diagnosis not present

## 2019-04-08 HISTORY — DX: Syncope and collapse: R55

## 2019-04-08 NOTE — Patient Instructions (Signed)
Medication Instructions:  Your physician recommends that you continue on your current medications as directed. Please refer to the Current Medication list given to you today.  *If you need a refill on your cardiac medications before your next appointment, please call your pharmacy*  Lab Work: none If you have labs (blood work) drawn today and your tests are completely normal, you will receive your results only by: Marland Kitchen MyChart Message (if you have MyChart) OR . A paper copy in the mail If you have any lab test that is abnormal or we need to change your treatment, we will call you to review the results.  Testing/Procedures: 1-  Your physician has requested that you have a lexiscan myoview. For further information please visit [email or web address] Please follow instruction sheet, as given. [facility] MYOVIEW  Your caregiver has ordered a Stress Test with nuclear imaging. The purpose of this test is to evaluate the blood supply to your heart muscle. This procedure is referred to as a "Non-Invasive Stress Test." This is because other than having an IV started in your vein, nothing is inserted or "invades" your body. Cardiac stress tests are done to find areas of poor blood flow to the heart by determining the extent of coronary artery disease (CAD). Some patients exercise on a treadmill, which naturally increases the blood flow to your heart, while others who are  unable to walk on a treadmill due to physical limitations have a pharmacologic/chemical stress agent called Lexiscan . This medicine will mimic walking on a treadmill by temporarily increasing your coronary blood flow.   Please note: these test may take anywhere between 2-4 hours to complete  PLEASE REPORT TO [facility] MEDICAL MALL ENTRANCE  THE VOLUNTEERS AT THE FIRST DESK WILL DIRECT YOU WHERE TO GO  Date of Procedure:_____________________________________  Arrival Time for Procedure:______________________________  Instructions regarding  medication:   _xx_ : Hold diabetes medication morning of procedure - JANUMET  PLEASE NOTIFY THE OFFICE AT LEAST 24 HOURS IN ADVANCE IF YOU ARE UNABLE TO KEEP YOUR APPOINTMENT.  [phone] AND  PLEASE NOTIFY NUCLEAR MEDICINE AT [facility] AT LEAST 24 HOURS IN ADVANCE IF YOU ARE UNABLE TO KEEP YOUR APPOINTMENT. [phone]  How to prepare for your Myoview test:  1. Do not eat or drink after midnight 2. No caffeine for 24 hours prior to test 3. No smoking 24 hours prior to test. 4. Your medication may be taken with water.  If your doctor stopped a medication because of this test, do not take that medication. 5. Ladies, please do not wear dresses.  Skirts or pants are appropriate. Please wear a short sleeve shirt. 6. No perfume, cologne or lotion. 7. Wear comfortable walking shoes. No heels!   2-  ZIO MONITOR AT FOR 14 DAYS- To wear after the lexiscan stress test This will be directly shipped to you from [facility]- they will call you just prior to receipt of the monitor or when you actually receive the monitor.  If you get a call from an 800# or a 224 area code in the next 2-5 business days, then please answer the call.   Your physician has recommended that you wear a Zio monitor. This monitor is a medical device that records the heart's electrical activity. Doctors most often use these monitors to diagnose arrhythmias. Arrhythmias are problems with the speed or rhythm of the heartbeat. The monitor is a small device applied to your chest. You can wear one while you do your normal daily activities. While wearing this  monitor if you have any symptoms to push the button and record what you felt. Once you have worn this monitor for the period of time provider prescribed (Usually 14 days), you will return the monitor device in the postage paid box. Once it is returned they will download the data collected and provide [place] with a report which the provider will then review and we will call you with those  results. Important tips:  1. Avoid showering during the first 24 hours of wearing the monitor. 2. Avoid excessive sweating to help maximize wear time. 3. Do not submerge the device, no hot tubs, and no swimming pools. 4. Keep any lotions or oils away from the patch. 5. After 24 hours you may shower with the patch on. Take brief showers with your back facing the shower head.  6. Do not remove patch once it has been placed because that will interrupt data and decrease adhesive wear time. 7. Push the button when you have any symptoms and write down what you were feeling. 8. Once you have completed wearing your monitor, remove and place into box which has postage paid and place in your outgoing mailbox.  9. If for some reason you have misplaced your box then call our office and we can provide another box and/or mail it off for you.  Follow-Up: At [facility], you and your health needs are our priority.  As part of our continuing mission to provide you with exceptional heart care, we have created designated Provider Care Teams.  These Care Teams include your primary Cardiologist (physician) and Advanced Practice Providers (APPs -  Physician Assistants and Nurse Practitioners) who all work together to provide you with the care you need, when you need it.  We recommend signing up for the patient portal called "MyChart".  Sign up information is provided on this After Visit Summary.  MyChart is used to connect with patients for Virtual Visits (Telemedicine).  Patients are able to view lab/test results, encounter notes, upcoming appointments, etc.  Non-urgent messages can be sent to your provider as well.   To learn more about what you can do with MyChart, go to [email or web address].    Your next appointment:   2 month(s)  The format for your next appointment:   In Person  Provider:    You may see Yvonne Kendall, MD or one of the following Advanced Practice Providers on your designated Care  Team:    Nicolasa Ducking, NP  Eula Listen, PA-C  Marisue Ivan, PA-C   Cardiac Nuclear Scan A cardiac nuclear scan is a test that measures blood flow to the heart when a person is resting and when he or she is exercising. The test looks for problems such as:  Not enough blood reaching a portion of the heart.  The heart muscle not working normally. You may need this test if:  You have heart disease.  You have had abnormal lab results.  You have had heart surgery or a balloon procedure to open up blocked arteries (angioplasty).  You have chest pain.  You have shortness of breath. In this test, a radioactive dye (tracer) is injected into your bloodstream. After the tracer has traveled to your heart, an imaging device is used to measure how much of the tracer is absorbed by or distributed to various areas of your heart. This procedure is usually done at a hospital and takes 2-4 hours. Tell a health care provider about:  Any allergies you have.  All  medicines you are taking, including vitamins, herbs, eye drops, creams, and over-the-counter medicines.  Any problems you or family members have had with anesthetic medicines.  Any blood disorders you have.  Any surgeries you have had.  Any medical conditions you have.  Whether you are pregnant or may be pregnant. What are the risks? Generally, this is a safe procedure. However, problems may occur, including:  Serious chest pain and heart attack. This is only a risk if the stress portion of the test is done.  Rapid heartbeat.  Sensation of warmth in your chest. This usually passes quickly.  Allergic reaction to the tracer. What happens before the procedure?  Ask your health care provider about changing or stopping your regular medicines. This is especially important if you are taking diabetes medicines or blood thinners.  Follow instructions from your health care provider about eating or drinking  restrictions.  Remove your jewelry on the day of the procedure. What happens during the procedure?  An IV will be inserted into one of your veins.  Your health care provider will inject a small amount of radioactive tracer through the IV.  You will wait for 20-40 minutes while the tracer travels through your bloodstream.  Your heart activity will be monitored with an electrocardiogram (ECG).  You will lie down on an exam table.  Images of your heart will be taken for about 15-20 minutes.  You may also have a stress test. For this test, one of the following may be done: ? You will exercise on a treadmill or stationary bike. While you exercise, your heart's activity will be monitored with an ECG, and your blood pressure will be checked. ? You will be given medicines that will increase blood flow to parts of your heart. This is done if you are unable to exercise.  When blood flow to your heart has peaked, a tracer will again be injected through the IV.  After 20-40 minutes, you will get back on the exam table and have more images taken of your heart.  Depending on the type of tracer used, scans may need to be repeated 3-4 hours later.  Your IV line will be removed when the procedure is over. The procedure may vary among health care providers and hospitals. What happens after the procedure?  Unless your health care provider tells you otherwise, you may return to your normal schedule, including diet, activities, and medicines.  Unless your health care provider tells you otherwise, you may increase your fluid intake. This will help to flush the contrast dye from your body. Drink enough fluid to keep your urine pale yellow.  Ask your health care provider, or the department that is doing the test: ? When will my results be ready? ? How will I get my results? Summary  A cardiac nuclear scan measures the blood flow to the heart when a person is resting and when he or she is  exercising.  Tell your health care provider if you are pregnant.  Before the procedure, ask your health care provider about changing or stopping your regular medicines. This is especially important if you are taking diabetes medicines or blood thinners.  After the procedure, unless your health care provider tells you otherwise, increase your fluid intake. This will help flush the contrast dye from your body.  After the procedure, unless your health care provider tells you otherwise, you may return to your normal schedule, including diet, activities, and medicines. This information is not intended to  replace advice given to you by your health care provider. Make sure you discuss any questions you have with your health care provider. Document Revised: 07/10/2017 Document Reviewed: 07/10/2017 Elsevier Patient Education  2020 [employer].

## 2019-04-08 NOTE — Progress Notes (Signed)
Follow-up Outpatient Visit Date: 04/08/2019  Primary Care Provider: Marygrace Drought, MD [place] [id number]  Chief Complaint: Hospital follow-up for elevated troponin  HPI:  [name] is a 79 y.o. female with history of seizure disorder, type 2 diabetes mellitus, hypertension, hyperlipidemia, and PAD, who presents for follow-up of elevated troponin during recent hospitalization.  The patient was admitted to [facility] last week after multiple witnessed seizures.  She was noted to have a mildly elevated high-sensitivity troponin, which remained flat.  Echocardiogram was notable for moderate LVH with grade 2 diastolic dysfunction with normal LVEF and regional wall motion.  The patient was asymptomatic while in the hospital from a heart standpoint, though she was quite confused.  Today, [name] reports that she is feeling fairly well.  Her only complaint is of poor memory.  This has been present for months to years but seems to have worsened recently.  She has not had any further "seizure" episodes.  Her husband, who accompanies [name] today, reports that she has had 2 events during which she was unresponsive.  The most recent one that landed her at [facility] occurred while she was in bed.  He found her incoherent and drooling.  [name] denies any chest pain, shortness of breath, palpitations, lightheadedness, and edema.  She is compliant with her medications.  Neurology follow-up is scheduled for 07/2019.  --------------------------------------------------------------------------------------------------  Past Medical History:  Diagnosis Date  . Cellulitis of left foot 05/23/2016  . Diabetes mellitus without complication ([place])   . Diabetic ulcer of left foot ([place]) 05/23/2016  . Hyperlipidemia   . Hypertension   . Peripheral vascular disease ([place])   . Seizure disorder ([place])   . Vitamin D deficiency 05/23/2016   History reviewed. No pertinent  surgical history.  Current Meds  Medication Sig  . Blood Glucose Monitoring Suppl (ACCU-CHEK GUIDE ME) w/Device KIT USE UP TO FOUR TIMES DAILY AS DIRECTED  . glucose blood test strip Use per insurance brand. Diagnosis:Diabetes Mellitus 2 Uncontrolled without Complications = Z32. 65. Check Blood Sugar up to four times daily.  . Lancets MISC Use per insurance brand. To use with Glucometer to monitor sugar up to four times daily.  Marland Kitchen levETIRAcetam (KEPPRA) 1000 MG tablet Take 1 tablet (1,000 mg total) by mouth 2 (two) times daily.  Marland Kitchen lisinopril-hydrochlorothiazide (ZESTORETIC) 10-12.5 MG tablet Take 1 tablet by mouth daily.  . Multiple Vitamin (MULTIVITAMIN) capsule Take 1 capsule by mouth daily.  . pravastatin (PRAVACHOL) 40 MG tablet Take 1 tablet (40 mg total) by mouth daily.  . sitaGLIPtin-metformin (JANUMET) 50-500 MG tablet Take 1 tablet by mouth 2 (two) times daily with a meal.  . Vitamin D, Cholecalciferol, 25 MCG (1000 UT) CAPS Take 1 capsule by mouth daily.    Allergies: Patient has no known allergies.  Social History   Tobacco Use  . Smoking status: Former Smoker    Packs/day: 0.25    Years: 50.00    Pack years: 12.50    Types: Cigarettes    Quit date: 2009    Years since quitting: 12.1  . Smokeless tobacco: Never Used  . Tobacco comment: smoking cessation materials not required  Substance Use Topics  . Alcohol use: Yes    Comment: every now and then  . Drug use: Never    Family History  Problem Relation Age of Onset  . Healthy Mother   . Healthy Father     Review of Systems: A 12-system  review of systems was performed and was negative except as noted in the HPI.  --------------------------------------------------------------------------------------------------  Physical Exam: BP 128/78 (BP Location: Left Arm, Patient Position: Sitting, Cuff Size: Normal)   Pulse 74   Ht 5' 9" (1.753 m)   Wt 203 lb (92.1 kg)   SpO2 95%   BMI 29.98 kg/m   General: NAD.   Accompanied by her husband. Neck: No JVD or HJR. Lungs: Clear to auscultation bilaterally without wheezes or crackles. Heart: Regular rate and rhythm with occasional extrasystoles.  No rubs or gallops. Abdomen: Soft, nontender, nondistended. Extremities: No lower extremity edema.  EKG: Sinus rhythm with occasional PACs.  Otherwise, no significant abnormality.  PACs are new since 04/01/2019.  Otherwise, there has been no significant interval change.  Lab Results  Component Value Date   WBC 10.2 04/02/2019   HGB 12.0 04/02/2019   HCT 36.2 04/02/2019   MCV 88.9 04/02/2019   PLT 192 04/02/2019    Lab Results  Component Value Date   NA 139 04/02/2019   K 4.3 04/02/2019   CL 104 04/02/2019   CO2 25 04/02/2019   BUN 15 04/02/2019   CREATININE 0.86 04/02/2019   GLUCOSE 134 (H) 04/02/2019   ALT 22 04/02/2019    Lab Results  Component Value Date   CHOL 154 08/17/2018   HDL 47 08/17/2018   LDLCALC 81 08/17/2018   TRIG 128 08/17/2018   CHOLHDL 3.3 08/17/2018    --------------------------------------------------------------------------------------------------  ASSESSMENT AND PLAN: Seizure versus syncope: Exact circumstances surrounding to "seizures" remain unclear.  During the most recent event that led to hospitalization last month at [facility], [name] was found incoherent and drooling by her husband.  She continued to be confused after being hospitalized, which was thought to be due to post ictal state.  Other than short-term memory loss, [name] feels well at this time.  Echocardiogram during her hospitalization was notable for LVH but otherwise no significant structural abnormality.  Given unclear circumstances surrounding her loss of consciousness, I have recommended that we obtain a 14 day event monitor to exclude underlying arrhythmia.  We will also work to expedite follow-up with neurology.  Elevated Troponin: Troponin trend was not consistent with ACS.  Additionally,  [name] did not nor does she currently endorse any symptoms of ischemic heart disease.  Nonetheless, I think it would be prudent to perform a pharmacologic myocardial perfusion stress test at her convenience.  Hypertension: Blood pressure is well-controlled at this time.  No medication changes will be made today.  Follow-up: Return to clinic in 2 months.  Nelva Bush, MD 04/08/2019 10:14 AM

## 2019-04-09 ENCOUNTER — Telehealth: Payer: Self-pay | Admitting: *Deleted

## 2019-04-09 ENCOUNTER — Telehealth: Payer: Self-pay | Admitting: Internal Medicine

## 2019-04-09 NOTE — Telephone Encounter (Signed)
After seeing Dr End yesterday in clinic, Dr End asked if we could get patient's appointment with Dr Malvin Johns (Neurology at [facility]) moved to sooner than June.  Called [facility] and was able to reschedule appointment to 04/16/19 at 10:30 am.  Called patient and she wrote down the new date and time of appointment. Her husband was not home for me to tell at this time but she will let him know as well.

## 2019-04-09 NOTE — Telephone Encounter (Signed)
[employer] calling to let us know monitor on hold pending insurance auth

## 2019-04-09 NOTE — Telephone Encounter (Signed)
Message sent to [facility] department.

## 2019-04-10 ENCOUNTER — Ambulatory Visit: Payer: Medicare HMO | Admitting: Nurse Practitioner

## 2019-04-11 NOTE — Telephone Encounter (Signed)
Spoke with [employer], [employer]. Insurance department said would need to speak directly to [place] about the status of the insurance. She was able to tell me it is still pending and in process. Thanked her for the information and help as an update is sufficient.

## 2019-04-11 NOTE — Telephone Encounter (Signed)
Twyla called back and let me know the insurance is still pending and that once approved will send patient the device. She will let me know if patient is not approved.

## 2019-04-22 ENCOUNTER — Ambulatory Visit: Admission: RE | Admit: 2019-04-22 | Payer: Medicare HMO | Source: Ambulatory Visit

## 2019-04-25 NOTE — Telephone Encounter (Signed)
I rhythm calling to discuss zio with patient and she was confused and unaware about any monitor .    Please call  I rhythm when ok to overnight and call patient to discuss.

## 2019-04-25 NOTE — Telephone Encounter (Signed)
Spoke with patient. She did not remember from office visit that she would be get a heart monitor in the mail. I recall patient has memory issues so I asked to speak with her husband. Spoke to husband and he did not know his wife has spoke with [employer] and he is ready for the monitor to be mailed. He asked to make sure in instructions go to him because his wife does not ever remember.  Called [employer] and they said the monitor was shipped and received at patient's home on 04/16/19 but has not been activated yet.  Called patient back again and asked to speak with husband. He is not home now and has left for work. She does not remember receiving a monitor. Husband works until 11 pm. I will have to call back another time during the day to see if he has seen the monitor.

## 2019-04-29 NOTE — Telephone Encounter (Signed)
NO answer on home number and no VM set up. Tried other number for husband and no answer/VM.  Need to discuss with him the monitor as the patient has memory issues and does not think the monitor arrived though [employer] said it was delivered.

## 2019-05-08 ENCOUNTER — Encounter: Payer: Self-pay | Admitting: *Deleted

## 2019-05-08 NOTE — Telephone Encounter (Signed)
Spoke with patient's husband. Patient has memory issues and need to talk to husband for order needs. He says they did not receive a heart monitor around 04/16/19. Advised I will contact [employer] to let them know and see if they can mail another one. I verified the address and the best phone number to call is [phone]. He verbalized undersatnding not to apply it until after the myoview stress test.  Steffanie Dunn needs to be scheduled as well. Scheduled patient for 05/17/19 at 8a with arrival of 7:30 am. Husband verbalized understanding of the preprocedural instructions as listed below. Letter with instructions mailed to patient as well.    [facility] MYOVIEW  Your caregiver has ordered a Stress Test with nuclear imaging. The purpose of this test is to evaluate the blood supply to your heart muscle. This procedure is referred to as a "Non-Invasive Stress Test." This is because other than having an IV started in your vein, nothing is inserted or "invades" your body. Cardiac stress tests are done to find areas of poor blood flow to the heart by determining the extent of coronary artery disease (CAD). Some patients exercise on a treadmill, which naturally increases the blood flow to your heart, while others who are  unable to walk on a treadmill due to physical limitations have a pharmacologic/chemical stress agent called Lexiscan . This medicine will mimic walking on a treadmill by temporarily increasing your coronary blood flow.   Please note: these test may take anywhere between 2-4 hours to complete  PLEASE REPORT TO [facility] MEDICAL MALL ENTRANCE  THE VOLUNTEERS AT THE FIRST DESK WILL DIRECT YOU WHERE TO GO  Date of Procedure:_____________________________________  Arrival Time for Procedure:______________________________  Instructions regarding medication:   _xx_ : Hold diabetes medication morning of procedure - JANUMET  PLEASE NOTIFY THE OFFICE AT LEAST 24 HOURS IN ADVANCE IF YOU ARE UNABLE  TO KEEP YOUR APPOINTMENT.  [phone] AND  PLEASE NOTIFY NUCLEAR MEDICINE AT [facility] AT LEAST 24 HOURS IN ADVANCE IF YOU ARE UNABLE TO KEEP YOUR APPOINTMENT. [phone]  How to prepare for your Myoview test:  1. Do not eat or drink after midnight 2. No caffeine for 24 hours prior to test 3. No smoking 24 hours prior to test. 4. Your medication may be taken with water.  If your doctor stopped a medication because of this test, do not take that medication. 5. Ladies, please do not wear dresses.  Skirts or pants are appropriate. Please wear a short sleeve shirt. 6. No perfume, cologne or lotion. 7. Wear comfortable walking shoes. No heels!

## 2019-05-17 ENCOUNTER — Ambulatory Visit: Payer: Medicare HMO

## 2019-05-24 ENCOUNTER — Other Ambulatory Visit: Payer: Self-pay

## 2019-05-24 ENCOUNTER — Ambulatory Visit
Admission: RE | Admit: 2019-05-24 | Discharge: 2019-05-24 | Disposition: A | Discharge status: Home / Self Care | Payer: Medicare HMO | Source: Ambulatory Visit | Attending: Internal Medicine | Admitting: Internal Medicine

## 2019-05-24 DIAGNOSIS — R778 Other specified abnormalities of plasma proteins: Secondary | ICD-10-CM | POA: Insufficient documentation

## 2019-05-24 DIAGNOSIS — R55 Syncope and collapse: Secondary | ICD-10-CM | POA: Diagnosis present

## 2019-05-24 LAB — NM MYOCAR MULTI W/SPECT W/WALL MOTION / EF
Estimated workload: 1 METS
Exercise duration (min): 0 min
Exercise duration (sec): 0 s
LV dias vol: 75 mL (ref 46–106)
LV sys vol: 28 mL
MPHR: 142 {beats}/min
Peak HR: 77 {beats}/min
Percent HR: 54 %
Rest HR: 63 {beats}/min
SDS: 1
SRS: 2
SSS: 2
TID: 0.86

## 2019-05-24 MED ORDER — TECHNETIUM TC 99M TETROFOSMIN IV KIT
10.4300 | PACK | Freq: Once | INTRAVENOUS | Status: AC | PRN
  Administered 2019-05-24: 10.43 via INTRAVENOUS

## 2019-05-24 MED ORDER — REGADENOSON 0.4 MG/5ML IV SOLN
0.4000 mg | Freq: Once | INTRAVENOUS | Status: AC
  Administered 2019-05-24: 09:00:00 0.4 mg via INTRAVENOUS

## 2019-05-24 MED ORDER — TECHNETIUM TC 99M TETROFOSMIN IV KIT
31.8200 | PACK | Freq: Once | INTRAVENOUS | Status: AC | PRN
  Administered 2019-05-24: 09:00:00 31.82 via INTRAVENOUS

## 2019-05-27 ENCOUNTER — Telehealth: Payer: Self-pay | Admitting: *Deleted

## 2019-05-27 NOTE — Telephone Encounter (Signed)
Results called to pt's husband, ok per DPR. He verbalized understanding. Patient still has not worn the ZIO AT monitor. Asked husband if they had received the monitor and he denies they ever received it.  Called [employer] and it shows it was delivered by [facility] though patient never received. They will mark the old one as lost and have me register the patient again.  Patient registered again at [employer] to receive ZIO AT as home address.  Husband is aware.

## 2019-05-27 NOTE — Telephone Encounter (Signed)
-----   Message from Yvonne Kendall, MD sent at 05/27/2019  7:35 AM EDT ----- Please let Ms. Wartman know that her stress test does not show evidence of a significant blockage.  We will follow-up with her after completion of the event monitor.

## 2019-06-12 ENCOUNTER — Ambulatory Visit: Payer: Medicare HMO | Admitting: Internal Medicine

## 2019-07-05 ENCOUNTER — Ambulatory Visit: Payer: Medicare HMO | Admitting: Internal Medicine

## 2019-07-05 ENCOUNTER — Telehealth: Payer: Self-pay | Admitting: *Deleted

## 2019-07-05 NOTE — Telephone Encounter (Signed)
Called and unable to leave vm. Patient does not have vm set up

## 2019-07-05 NOTE — Progress Notes (Deleted)
   Follow-up Outpatient Visit Date: 07/05/2019  Primary Care Provider: Sharilyn Sites, MD No address on file  Chief Complaint: ***  HPI:  [name] is a 79 y.o. female with history of seizure disorder, type 2 diabetes mellitus, hypertension, hyperlipidemia, and PAD, who presents for follow-up of elevated troponin during hospitalization earlier this year.  I saw her for follow-up in early March, at which time she was feeling well other than continued poor memory.  We agreed to perform a myocardial perfusion stress test for evaluation of the elevated troponin noted at the time of her hospitalization.  Study was low risk without evidence of ischemia or scar.  LVEF was reported at 46%, though preceding echo in February showed a normal LVEF.  --------------------------------------------------------------------------------------------------  Past Medical History:  Diagnosis Date  . Cellulitis of left foot 05/23/2016  . Diabetes mellitus without complication (HCC)   . Diabetic ulcer of left foot (HCC) 05/23/2016  . Hyperlipidemia   . Hypertension   . Peripheral vascular disease (HCC)   . Seizure disorder (HCC)   . Vitamin D deficiency 05/23/2016   No past surgical history on file.  No outpatient medications have been marked as taking for the 07/05/19 encounter (Appointment) with Zymeir Salminen, Cristal Deer, MD.    Allergies: Patient has no known allergies.  Social History   Tobacco Use  . Smoking status: Former Smoker    Packs/day: 0.25    Years: 50.00    Pack years: 12.50    Types: Cigarettes    Quit date: 2009    Years since quitting: 12.4  . Smokeless tobacco: Never Used  . Tobacco comment: smoking cessation materials not required  Substance Use Topics  . Alcohol use: Yes    Comment: every now and then  . Drug use: Never    Family History  Problem Relation Age of Onset  . Healthy Mother   . Healthy Father     Review of Systems: A 12-system review of systems was performed and  was negative except as noted in the HPI.  --------------------------------------------------------------------------------------------------  Physical Exam: There were no vitals taken for this visit.  General:  *** HEENT: No conjunctival pallor or scleral icterus. Facemask in place. Neck: Supple without lymphadenopathy, thyromegaly, JVD, or HJR. Lungs: Normal work of breathing. Clear to auscultation bilaterally without wheezes or crackles. Heart: Regular rate and rhythm without murmurs, rubs, or gallops. Non-displaced PMI. Abd: Bowel sounds present. Soft, NT/ND without hepatosplenomegaly Ext: No lower extremity edema. Radial, PT, and DP pulses are 2+ bilaterally. Skin: Warm and dry without rash.  EKG:  ***  Lab Results  Component Value Date   WBC 10.2 04/02/2019   HGB 12.0 04/02/2019   HCT 36.2 04/02/2019   MCV 88.9 04/02/2019   PLT 192 04/02/2019    Lab Results  Component Value Date   NA 139 04/02/2019   K 4.3 04/02/2019   CL 104 04/02/2019   CO2 25 04/02/2019   BUN 15 04/02/2019   CREATININE 0.86 04/02/2019   GLUCOSE 134 (H) 04/02/2019   ALT 22 04/02/2019    Lab Results  Component Value Date   CHOL 154 08/17/2018   HDL 47 08/17/2018   LDLCALC 81 08/17/2018   TRIG 128 08/17/2018   CHOLHDL 3.3 08/17/2018    --------------------------------------------------------------------------------------------------  ASSESSMENT AND PLAN: Cristal Deer Kenetra Hildenbrand, MD 07/05/2019 6:24 AM

## 2019-07-05 NOTE — Telephone Encounter (Signed)
Could you follow up with her next week to get her scheduled? I am out of office next week. When and if you get her on the phone ask her when she mailed it back.   Thanks

## 2019-07-05 NOTE — Telephone Encounter (Signed)
FYI

## 2019-07-05 NOTE — Telephone Encounter (Signed)
Spoke with patient and she has not mailed back her monitor at this time. She states that she has the monitor and box available. Instructed her to please mail the monitor back in the box provided and that postage is paid so nothing for her to do but put in box, tape up, and place in mailbox. Advised that she has appointment today but she reports that will need to be rescheduled. Instructed her to please mail in monitor and I will have scheduling give her a call to reschedule. She verbalized understanding with no further questions at this time.

## 2019-07-05 NOTE — Telephone Encounter (Signed)
-----   Message from Kendrick Fries, [place] sent at 07/04/2019  2:39 PM EDT ----- Pt has appointment tomorrow Zio not DL. Please advise.

## 2019-07-22 ENCOUNTER — Other Ambulatory Visit: Payer: Self-pay

## 2019-07-22 ENCOUNTER — Ambulatory Visit (INDEPENDENT_AMBULATORY_CARE_PROVIDER_SITE_OTHER): Payer: Medicare HMO

## 2019-07-22 VITALS — BP 120/70 | HR 69 | Resp 16 | Ht 69.0 in | Wt 200.8 lb

## 2019-07-22 DIAGNOSIS — Z0100 Encounter for examination of eyes and vision without abnormal findings: Secondary | ICD-10-CM | POA: Diagnosis not present

## 2019-07-22 DIAGNOSIS — E119 Type 2 diabetes mellitus without complications: Secondary | ICD-10-CM | POA: Diagnosis not present

## 2019-07-22 DIAGNOSIS — Z0000 Encounter for general adult medical examination without abnormal findings: Secondary | ICD-10-CM | POA: Diagnosis not present

## 2019-07-22 NOTE — Progress Notes (Signed)
Subjective:   [name] is a 79 y.o. female who presents for Medicare Annual (Subsequent) preventive examination.  Review of Systems:   Cardiac Risk Factors include: advanced age (>85mn, >>60women);diabetes mellitus;hypertension;dyslipidemia;sedentary lifestyle     Objective:     Vitals: BP 120/70 (BP Location: Right Arm, Patient Position: Sitting, Cuff Size: Normal)   Pulse 69   Resp 16   Ht '5\' 9"'  (1.753 m)   Wt 200 lb 12.8 oz (91.1 kg)   SpO2 98%   BMI 29.65 kg/m   Body mass index is 29.65 kg/m.  Advanced Directives 04/01/2019 04/01/2019 07/16/2018 07/12/2017 08/18/2016 06/17/2016 05/23/2016  Does Patient Have a Medical Advance Directive? No Unable to assess, patient is non-responsive or altered mental status No No No No No  Would patient like information on creating a medical advance directive? No - Patient declined - No - Patient declined Yes (MAU/Ambulatory/Procedural Areas - Information given) - - -    Tobacco Social History   Tobacco Use  Smoking Status Former Smoker  . Packs/day: 0.25  . Years: 50.00  . Pack years: 12.50  . Types: Cigarettes  . Quit date: 2009  . Years since quitting: 12.4  Smokeless Tobacco Never Used  Tobacco Comment   smoking cessation materials not required     Counseling given: Not Answered Comment: smoking cessation materials not required   Clinical Intake:  Pre-visit preparation completed: Yes  Pain : No/denies pain     BMI - recorded: 29.65 Nutritional Status: BMI 25 -29 Overweight Nutritional Risks: None Diabetes: Yes CBG done?: No Did pt. bring in CBG monitor from home?: No  How often do you need to have someone help you when you read instructions, pamphlets, or other written materials from your doctor or pharmacy?: 1 - Never  Interpreter Needed?: No  Information entered by :: KClemetine MarkerLPN  Past Medical History:  Diagnosis Date  . Cellulitis of left foot 05/23/2016  . [place] complication  [place]   . [place] ([place] 05/23/2016  . Hyperlipidemia   . Hypertension   . Peripheral vascular disease ([facility]   . Seizure disorder ([place]   . Vitamin D deficiency 05/23/2016   History reviewed. No pertinent surgical history. Family History  Problem Relation Age of Onset  . Healthy Mother   . Healthy Father    Social History   Socioeconomic History  . Marital status: Married    Spouse name: AArnell Sieving . Number of children: 2  . Years of education: Not on file  . Highest education level: 12th grade  Occupational History  . Occupation: Retired  Tobacco Use  . Smoking status: Former Smoker    Packs/day: 0.25    Years: 50.00    Pack years: 12.50    Types: Cigarettes    Quit date: 2009    Years since quitting: 12.4  . Smokeless tobacco: Never Used  . Tobacco comment: smoking cessation materials not required  Vaping Use  . Vaping Use: Never used  Substance and Sexual Activity  . Alcohol use: Yes    Comment: every now and then  . Drug use: Never  . Sexual activity: Not Currently  Other Topics Concern  . Not on file  Social History Narrative  . Not on file   Social Determinants of Health   Financial Resource Strain: Low Risk   . Difficulty of Paying Living Expenses: Not hard at all  Food Insecurity: No Food Insecurity  . Worried About  Running Out of Food in the Last Year: Never true  . Ran Out of Food in the Last Year: Never true  Transportation Needs: No Transportation Needs  . Lack of Transportation (Medical): No  . Lack of Transportation (Non-Medical): No  Physical Activity: Inactive  . Days of Exercise per Week: 0 days  . Minutes of Exercise per Session: 0 min  Stress: No Stress Concern Present  . Feeling of Stress : Not at all  Social Connections: Moderately Integrated  . Frequency of Communication with Friends and Family: More than three times a week  . Frequency of Social Gatherings with Friends and Family: Three times a week  . Attends  Religious Services: More than 4 times per year  . Active Member of Clubs or Organizations: No  . Attends Archivist Meetings: Never  . Marital Status: Married    Outpatient Encounter Medications as of 07/22/2019  Medication Sig  . Blood Glucose Monitoring Suppl (ACCU-CHEK GUIDE ME) w/Device KIT USE UP TO FOUR TIMES DAILY AS DIRECTED  . glucose blood test strip Use per insurance brand. Diagnosis:Diabetes Mellitus 2 Uncontrolled without Complications = W11. 65. Check Blood Sugar up to four times daily.  . Lancets MISC Use per insurance brand. To use with Glucometer to monitor sugar up to four times daily.  Marland Kitchen levETIRAcetam (KEPPRA) 1000 MG tablet Take 1 tablet (1,000 mg total) by mouth 2 (two) times daily.  Marland Kitchen lisinopril-hydrochlorothiazide (ZESTORETIC) 10-12.5 MG tablet Take 1 tablet by mouth daily.  . Multiple Vitamin (MULTIVITAMIN) capsule Take 1 capsule by mouth daily.  . pravastatin (PRAVACHOL) 40 MG tablet Take 1 tablet (40 mg total) by mouth daily.  . sitaGLIPtin-metformin (JANUMET) 50-500 MG tablet Take 1 tablet by mouth 2 (two) times daily with a meal.  . Vitamin D, Cholecalciferol, 25 MCG (1000 UT) CAPS Take 1 capsule by mouth daily.   No facility-administered encounter medications on file as of 07/22/2019.    Activities of Daily Living In your present state of health, do you have any difficulty performing the following activities: 07/22/2019 04/01/2019  Hearing? N N  Comment declines hearing aids -  Vision? N N  Difficulty concentrating or making decisions? Tempie Donning  Walking or climbing stairs? N N  Dressing or bathing? N Y  Doing errands, shopping? N N  Preparing Food and eating ? N -  Using the Toilet? N -  In the past six months, have you accidently leaked urine? N -  Do you have problems with loss of bowel control? N -  Managing your Medications? N -  Managing your Finances? N -  Housekeeping or managing your Housekeeping? N -  Some recent data might be hidden     Patient Care Team: Glean Hess, MD as PCP - General (Internal Medicine) End, Harrell Gave, MD as PCP - Cardiology (Cardiology)    Assessment:   This is a routine wellness examination for Glennice.  Exercise Activities and Dietary recommendations Current Exercise Habits: The patient does not participate in regular exercise at present, Exercise limited by: None identified  Goals    . DIET - INCREASE WATER INTAKE     Recommend to drink at least 6-8 8oz glasses of water per day.    . Patient Stated     Patient states she would like to maintain A1c and keep diabetes well controlled       Fall Risk Fall Risk  07/22/2019 08/17/2018 07/16/2018 04/27/2018 12/29/2017  Falls in the past year? 0 0 0  0 0  Number falls in past yr: 0 0 0 0 0  Injury with Fall? 0 0 0 0 0  Risk for fall due to : No Fall Risks Impaired balance/gait;History of fall(s) - Impaired balance/gait History of fall(s)  Risk for fall due to: Comment - - - - -  Follow up Falls prevention discussed Falls evaluation completed Falls prevention discussed Falls evaluation completed -   FALL RISK PREVENTION PERTAINING TO THE HOME:  Any stairs in or around the home? No  If so, are there any without handrails? No   Home free of loose throw rugs in walkways, pet beds, electrical cords, etc? Yes  Adequate lighting in your home to reduce risk of falls? Yes   ASSISTIVE DEVICES UTILIZED TO PREVENT FALLS:  Life alert? No  Use of a cane, walker or w/c? No  Grab bars in the bathroom? No  Shower chair or bench in shower? Yes  Elevated toilet seat or a handicapped toilet? No   DME ORDERS:  DME order needed?  No   TIMED UP AND GO:  Was the test performed? Yes .  Length of time to ambulate 10 feet: 5 sec.   GAIT:  Appearance of gait: Gait steady and fast without the use of an assistive device.   Education: Fall risk prevention has been discussed.  Intervention(s) required? No     Depression Screen PHQ 2/9 Scores  07/22/2019 08/17/2018 07/16/2018 04/27/2018  PHQ - 2 Score 0 0 0 0  PHQ- 9 Score - 0 - -     Cognitive Function     6CIT Screen 07/22/2019 07/16/2018 07/12/2017  What Year? 0 points 0 points 0 points  What month? 0 points 3 points 3 points  What time? 0 points 0 points 0 points  Count back from 20 0 points 0 points 0 points  Months in reverse 4 points 0 points 2 points  Repeat phrase 8 points 4 points 10 points  Total Score '12 7 15    ' There is no immunization history for the selected administration types on file for this patient.  Qualifies for Shingles Vaccine? Yes . Due for Shingrix. Education has been provided regarding the importance of this vaccine. Pt has been advised to call insurance company to determine out of pocket expense. Advised may also receive vaccine at local pharmacy or Health Dept. Verbalized acceptance and understanding.  Tdap: Although this vaccine is not a covered service during a Wellness Exam, does the patient still wish to receive this vaccine today?  No .  Education has been provided regarding the importance of this vaccine. Advised may receive this vaccine at local pharmacy or Health Dept. Aware to provide a copy of the vaccination record if obtained from local pharmacy or Health Dept. Verbalized acceptance and understanding.  Flu Vaccine: Due for Flu vaccine. Does the patient want to receive this vaccine today?  No . Education has been provided regarding the importance of this vaccine but still declined. Advised may receive this vaccine at local pharmacy or Health Dept. Aware to provide a copy of the vaccination record if obtained from local pharmacy or Health Dept. Verbalized acceptance and understanding.  Pneumococcal Vaccine: Due for Pneumococcal vaccine. Does the patient want to receive this vaccine today?  No . Education has been provided regarding the importance of this vaccine but still declined. Advised may receive this vaccine at local pharmacy or Health Dept.  Aware to provide a copy of the vaccination record if obtained from  local pharmacy or Health Dept. Verbalized acceptance and understanding.  Covid-19 Vaccine: Up to date per patient. Pt advised to bring vaccination record to next appt.   Screening Tests Health Maintenance  Topic Date Due  . Hepatitis C Screening  Never done  . COVID-19 Vaccine (1) Never done  . OPHTHALMOLOGY EXAM  04/07/2017  . FOOT EXAM  04/27/2019  . DEXA SCAN  08/17/2019 (Originally 10/30/2005)  . TETANUS/TDAP  08/17/2019 (Originally 10/31/1959)  . PNA vac Low Risk Adult (1 of 2 - PCV13) 08/17/2019 (Originally 10/30/2005)  . INFLUENZA VACCINE  09/08/2019  . HEMOGLOBIN A1C  09/29/2019    Cancer Screenings:  Colorectal Screening:  No longer required.   Mammogram:  No longer required.   Bone Density: Not completed. Pt declines this screening.   Lung Cancer Screening: (Low Dose CT Chest recommended if Age 69-80 years, 30 pack-year currently smoking OR have quit w/in 15years.) does not qualify.   Additional Screening:  Hepatitis C Screening: no longer required  Vision Screening: Recommended annual ophthalmology exams for early detection of glaucoma and other disorders of the eye. Is the patient up to date with their annual eye exam?  No  Who is the provider or what is the name of the office in which the pt attends annual eye exams? Not established If pt is not established with a provider, would they like to be referred to a provider to establish care? Yes. Ophthalmology referral has been placed. Pt aware the office will call re: appt.  Dental Screening: Recommended annual dental exams for proper oral hygiene  Community Resource Referral:  CRR required this visit?  No      Plan:     I have personally reviewed and addressed the Medicare Annual Wellness questionnaire and have noted the following in the patient's chart:  A. Medical and social history B. Use of alcohol, tobacco or illicit drugs  C. Current  medications and supplements D. Functional ability and status E.  Nutritional status F.  Physical activity G. Advance directives H. List of other physicians I.  Hospitalizations, surgeries, and ER visits in previous 12 months J.  [place] such as hearing and vision if needed, cognitive and depression L. Referrals and appointments   In addition, I have reviewed and discussed with patient certain preventive protocols, quality metrics, and best practice recommendations. A written personalized care plan for preventive services as well as general preventive health recommendations were provided to patient.   Signed,  Clemetine Marker, LPN Nurse Health Advisor   Nurse Notes: pt accompanied to visit today by her husband who states patient has been struggling with memory issues for some time now. Pt states she can remember things if she is focused on them or they are important but otherwise she doesn't remember. Pt has not been seen by neuro. Pt advised to follow up with Dr. Army Melia for annual exam.

## 2019-07-22 NOTE — Patient Instructions (Signed)
[name] , Thank you for taking time to come for your Medicare Wellness Visit. I appreciate your ongoing commitment to your health goals. Please review the following plan we discussed and let me know if I can assist you in the future.   Screening recommendations/referrals: Colonoscopy: no longer required Mammogram: no longer required Bone Density: no longer required Recommended yearly ophthalmology/optometry visit for glaucoma screening and checkup Recommended yearly dental visit for hygiene and checkup  Vaccinations: Influenza vaccine: postponed Pneumococcal vaccine: postponed Tdap vaccine: due Shingles vaccine: Shingrix discussed. Please contact your pharmacy for coverage information.  Covid-19:completed - please bring your vaccination record with you to your next appt  Advanced directives: Advance directive discussed with you today. I have provided a copy for you to complete at home and have notarized. Once this is complete please bring a copy in to our office so we can scan it into your chart.  Conditions/risks identified: Recommend increasing physical activity to help maintain diabetes  Next appointment: Follow up in one year for your annual wellness visit    Preventive Care 65 Years and Older, Female Preventive care refers to lifestyle choices and visits with your health care provider that can promote health and wellness. What does preventive care include?  A yearly physical exam. This is also called an annual well check.  Dental exams once or twice a year.  Routine eye exams. Ask your health care provider how often you should have your eyes checked.  Personal lifestyle choices, including:  Daily care of your teeth and gums.  Regular physical activity.  Eating a healthy diet.  Avoiding tobacco and drug use.  Limiting alcohol use.  Practicing safe sex.  Taking low-dose aspirin every day.  Taking vitamin and mineral supplements as recommended by your health  care provider. What happens during an annual well check? The services and screenings done by your health care provider during your annual well check will depend on your age, overall health, lifestyle risk factors, and family history of disease. Counseling  Your health care provider may ask you questions about your:  Alcohol use.  Tobacco use.  Drug use.  Emotional well-being.  Home and relationship well-being.  Sexual activity.  Eating habits.  History of falls.  Memory and ability to understand (cognition).  Work and work Astronomer.  Reproductive health. Screening  You may have the following tests or measurements:  Height, weight, and BMI.  Blood pressure.  Lipid and cholesterol levels. These may be checked every 5 years, or more frequently if you are over 33 years old.  Skin check.  Lung cancer screening. You may have this screening every year starting at age 14 if you have a 30-pack-year history of smoking and currently smoke or have quit within the past 15 years.  Fecal occult blood test (FOBT) of the stool. You may have this test every year starting at age 12.  Flexible sigmoidoscopy or colonoscopy. You may have a sigmoidoscopy every 5 years or a colonoscopy every 10 years starting at age 83.  Hepatitis C blood test.  Hepatitis B blood test.  Sexually transmitted disease (STD) testing.  Diabetes screening. This is done by checking your blood sugar (glucose) after you have not eaten for a while (fasting). You may have this done every 1-3 years.  Bone density scan. This is done to screen for osteoporosis. You may have this done starting at age 55.  Mammogram. This may be done every 1-2 years. Talk to your health care provider about  how often you should have regular mammograms. Talk with your health care provider about your test results, treatment options, and if necessary, the need for more tests. Vaccines  Your health care provider may recommend certain  vaccines, such as:  Influenza vaccine. This is recommended every year.  Tetanus, diphtheria, and acellular pertussis (Tdap, Td) vaccine. You may need a Td booster every 10 years.  Zoster vaccine. You may need this after age 68.  Pneumococcal 13-valent conjugate (PCV13) vaccine. One dose is recommended after age 1.  Pneumococcal polysaccharide (PPSV23) vaccine. One dose is recommended after age 58. Talk to your health care provider about which screenings and vaccines you need and how often you need them. This information is not intended to replace advice given to you by your health care provider. Make sure you discuss any questions you have with your health care provider. Document Released: 02/20/2015 Document Revised: 10/14/2015 Document Reviewed: 11/25/2014 Elsevier Interactive Patient Education  2017 [place] Prevention in the Home Falls can cause injuries. They can happen to people of all ages. There are many things you can do to make your home safe and to help prevent falls. What can I do on the outside of my home?  Regularly fix the edges of walkways and driveways and fix any cracks.  Remove anything that might make you trip as you walk through a door, such as a raised step or threshold.  Trim any bushes or trees on the path to your home.  Use bright outdoor lighting.  Clear any walking paths of anything that might make someone trip, such as rocks or tools.  Regularly check to see if handrails are loose or broken. Make sure that both sides of any steps have handrails.  Any raised decks and porches should have guardrails on the edges.  Have any leaves, snow, or ice cleared regularly.  Use sand or salt on walking paths during winter.  Clean up any spills in your garage right away. This includes oil or grease spills. What can I do in the bathroom?  Use night lights.  Install grab bars by the toilet and in the tub and shower. Do not use towel bars as grab  bars.  Use non-skid mats or decals in the tub or shower.  If you need to sit down in the shower, use a plastic, non-slip stool.  Keep the floor dry. Clean up any water that spills on the floor as soon as it happens.  Remove soap buildup in the tub or shower regularly.  Attach bath mats securely with double-sided non-slip rug tape.  Do not have throw rugs and other things on the floor that can make you trip. What can I do in the bedroom?  Use night lights.  Make sure that you have a light by your bed that is easy to reach.  Do not use any sheets or blankets that are too big for your bed. They should not hang down onto the floor.  Have a firm chair that has side arms. You can use this for support while you get dressed.  Do not have throw rugs and other things on the floor that can make you trip. What can I do in the kitchen?  Clean up any spills right away.  Avoid walking on wet floors.  Keep items that you use a lot in easy-to-reach places.  If you need to reach something above you, use a strong step stool that has a grab bar.  Keep  electrical cords out of the way.  Do not use floor polish or wax that makes floors slippery. If you must use wax, use non-skid floor wax.  Do not have throw rugs and other things on the floor that can make you trip. What can I do with my stairs?  Do not leave any items on the stairs.  Make sure that there are handrails on both sides of the stairs and use them. Fix handrails that are broken or loose. Make sure that handrails are as long as the stairways.  Check any carpeting to make sure that it is firmly attached to the stairs. Fix any carpet that is loose or worn.  Avoid having throw rugs at the top or bottom of the stairs. If you do have throw rugs, attach them to the floor with carpet tape.  Make sure that you have a light switch at the top of the stairs and the bottom of the stairs. If you do not have them, ask someone to add them for  you. What else can I do to help prevent falls?  Wear shoes that:  Do not have high heels.  Have rubber bottoms.  Are comfortable and fit you well.  Are closed at the toe. Do not wear sandals.  If you use a stepladder:  Make sure that it is fully opened. Do not climb a closed stepladder.  Make sure that both sides of the stepladder are locked into place.  Ask someone to hold it for you, if possible.  Clearly mark and make sure that you can see:  Any grab bars or handrails.  First and last steps.  Where the edge of each step is.  Use tools that help you move around (mobility aids) if they are needed. These include:  Canes.  Walkers.  Scooters.  Crutches.  Turn on the lights when you go into a dark area. Replace any light bulbs as soon as they burn out.  Set up your furniture so you have a clear path. Avoid moving your furniture around.  If any of your floors are uneven, fix them.  If there are any pets around you, be aware of where they are.  Review your medicines with your doctor. Some medicines can make you feel dizzy. This can increase your chance of falling. Ask your doctor what other things that you can do to help prevent falls. This information is not intended to replace advice given to you by your health care provider. Make sure you discuss any questions you have with your health care provider. Document Released: 11/20/2008 Document Revised: 07/02/2015 Document Reviewed: 02/28/2014 Elsevier Interactive Patient Education  2017 [employer].

## 2019-07-23 ENCOUNTER — Encounter: Payer: Self-pay | Admitting: *Deleted

## 2019-07-23 ENCOUNTER — Encounter: Payer: Self-pay | Admitting: Internal Medicine

## 2019-07-23 DIAGNOSIS — R911 Solitary pulmonary nodule: Secondary | ICD-10-CM | POA: Insufficient documentation

## 2019-07-23 NOTE — Progress Notes (Signed)
Abnormal finding noted. PCP notified and requested referral to the lung nodule clinic for further follow up if appropriate. 

## 2019-07-30 NOTE — Telephone Encounter (Signed)
Spoke with patient and inquired about her monitor. She stated that she no longer had it. She did request that we send her another monitor and she would do her best. Reviewed that once she wears it for 2 weeks to remove and mail back because they have to download the data from it in order for [place] to know what is going on. She did ask why she needed monitor and reviewed that she had episode of syncope and monitor will tell us if there is an electrical issue which could have caused this. She then verbalized understanding and requested that we please send her monitor to wear again. Will enter that request in website and reviewed importance of returning at the end of the 2 weeks. She had no further questions at this time.

## 2019-07-30 NOTE — Telephone Encounter (Signed)
Spoke with patient this morning. Patient stated she did not know that she had to send the monitor back in and does not have it now. Patient was advised the nurse would be made aware and will provide further instruction.   Thank you

## 2019-08-07 ENCOUNTER — Other Ambulatory Visit: Payer: Self-pay | Admitting: Oncology

## 2019-08-07 DIAGNOSIS — R911 Solitary pulmonary nodule: Secondary | ICD-10-CM

## 2019-08-07 NOTE — Progress Notes (Signed)
  Pulmonary Nodule Clinic Telephone Note [facility]   Received referral from Dr. Judithann Graves; patient's PCP.  HPI: [name] is a 79 year old female with past medical history significant for hypertension, CVA, type 2 diabetes, hyperlipidemia, seizure disorder, B12 deficiency who was admitted to [facility] back in February 2021 for acute seizure and encephalopathy.  She was found to have a UTI and was treated accordingly.  Additional imaging did reveal a right upper lobe 4 mm subpleural nodule that appears new.  The patient is considered high risk follow-up is recommended.  Review and Recommendations: I personally reviewed all patient's previous imaging including imaging from her recent hospital stay in February 2021.  I recommend follow-up with non-contrast chest CT in 1 year from previous.  Social History: She is a former smoker.   Social History   Tobacco Use  Smoking Status Former Smoker  . Packs/day: 0.25  . Years: 50.00  . Pack years: 12.50  . Types: Cigarettes  . Quit date: 2009  . Years since quitting: 12.5  Smokeless Tobacco Never Used  Tobacco Comment   smoking cessation materials not required     High risk factors include: History of heavy smoking, exposure to asbestos, radium or uranium, personal family history of lung cancer, older age, sex (females greater than males), race (black and native [place] greater than weight), marginal speculation, upper lobe location, multiplicity (less than 5 nodules increases risk for malignancy) and emphysema and/or pulmonary fibrosis.   This recommendation follows the consensus statement: Guidelines for Management of Incidental Pulmonary Nodules Detected on CT Images: From the Fleischner Society 2017; Radiology 2017; 284:228-243.    I have placed order for CT scan without contrast to be completed approximately 12 months from previous.  Disposition: Order placed for repeat CT chest. Will notify Micael Hampshire in  scheduling. [address] to call patient with appointment date and time. Return to pulmonary nodule clinic a few days after his repeat imaging to discuss results and plan moving forward.  Durenda Hurt, NP 08/07/2019 1:35 PM

## 2019-08-30 ENCOUNTER — Telehealth: Payer: Self-pay | Admitting: Internal Medicine

## 2019-08-30 NOTE — Telephone Encounter (Signed)
Dr. Judithann Graves did not prescribe this medication Arnetha Courser, MD prescribed this medication for patient.  KP

## 2019-08-30 NOTE — Telephone Encounter (Signed)
Medication Refill - Medication: Keppra   Has the patient contacted their pharmacy? Yes.   (Agent: If no, request that the patient contact the pharmacy for the refill.) (Agent: If yes, when and what did the pharmacy advise?)  Preferred Pharmacy (with phone number or street name):  [id number] - [place], [place] - [address] [place] [address] Agent: Please be advised that RX refills may take up to 3 business days. We ask that you follow-up with your pharmacy.

## 2019-09-02 ENCOUNTER — Other Ambulatory Visit: Payer: Self-pay

## 2019-09-02 DIAGNOSIS — G40909 Epilepsy, unspecified, not intractable, without status epilepticus: Secondary | ICD-10-CM

## 2019-09-02 NOTE — Telephone Encounter (Signed)
Pt's spouse called in to follow up on refill request for medication. Advised of message below per CMA, spouse expressed understanding but was unhappy that no one called them to update. Apologized sincerely to spouse & pt and provided phone number to Dr. Tilman Neat office for further assistance.

## 2019-09-05 LAB — HEPATIC FUNCTION PANEL
ALT: 16 (ref 7–35)
AST: 15 (ref 13–35)

## 2019-09-05 LAB — COMPREHENSIVE METABOLIC PANEL
Calcium: 10.5 (ref 8.7–10.7)
GFR calc non Af Amer: 48

## 2019-09-05 LAB — CBC AND DIFFERENTIAL
HCT: 41 (ref 36–46)
Hemoglobin: 12.9 (ref 12.0–16.0)
WBC: 6.7

## 2019-09-05 LAB — VITAMIN D 25 HYDROXY (VIT D DEFICIENCY, FRACTURES): Vit D, 25-Hydroxy: 51

## 2019-09-05 LAB — BASIC METABOLIC PANEL
BUN: 20 (ref 4–21)
Creatinine: 1.3 — AB (ref 0.5–1.1)

## 2019-09-05 LAB — TSH: TSH: 1.7 (ref 0.41–5.90)

## 2019-09-05 LAB — VITAMIN B12: Vitamin B-12: 463

## 2019-09-05 LAB — IRON,TIBC AND FERRITIN PANEL: Ferritin: 121

## 2019-09-05 LAB — HEMOGLOBIN A1C: Hemoglobin A1C: 6.4

## 2019-09-16 ENCOUNTER — Encounter: Payer: Medicare HMO | Admitting: Internal Medicine

## 2019-09-16 NOTE — Progress Notes (Deleted)
Date:  09/16/2019   Name:  [name]   DOB:  [birth year]   MRN:  [id number]   Chief Complaint: No chief complaint on file. [name] is a 79 y.o. female who presents today for her Complete Annual Exam. She feels {DESC; WELL/FAIRLY WELL/POORLY:18703}. She reports exercising ***. She reports she is sleeping {DESC; WELL/FAIRLY WELL/POORLY:18703}. Breast complaints ***.  Mammogram: aged out DEXA: pt declined Colonoscopy: aged out  There is no immunization history for the selected administration types on file for this patient.   Hypertension This is a chronic problem. The problem is controlled. Pertinent negatives include no chest pain, headaches, palpitations or shortness of breath. Past treatments include ACE inhibitors and diuretics. The current treatment provides significant improvement.  Diabetes She presents for her follow-up diabetic visit. She has type 2 diabetes mellitus. Her disease course has been stable. Pertinent negatives for hypoglycemia include no dizziness, headaches, nervousness/anxiousness or tremors. Pertinent negatives for diabetes include no chest pain, no fatigue, no polydipsia and no polyuria. Current diabetic treatment includes oral agent (dual therapy) (janumet). She is compliant with treatment most of the time. An ACE inhibitor/angiotensin II receptor blocker is being taken.  Hyperlipidemia This is a chronic problem. The problem is controlled. Pertinent negatives include no chest pain or shortness of breath. Current antihyperlipidemic treatment includes statins. The current treatment provides significant improvement of lipids.    Lab Results  Component Value Date   CREATININE 1.3 (A) 09/05/2019   BUN 20 09/05/2019   NA 139 04/02/2019   K 4.3 04/02/2019   CL 104 04/02/2019   CO2 25 04/02/2019   Lab Results  Component Value Date   CHOL 154 08/17/2018   HDL 47 08/17/2018   LDLCALC 81 08/17/2018   TRIG 128 08/17/2018   CHOLHDL 3.3 08/17/2018     Lab Results  Component Value Date   TSH 0.620 04/01/2019   Lab Results  Component Value Date   HGBA1C 6.4 09/05/2019   Lab Results  Component Value Date   WBC 10.2 04/02/2019   HGB 12.0 04/02/2019   HCT 36.2 04/02/2019   MCV 88.9 04/02/2019   PLT 192 04/02/2019   Lab Results  Component Value Date   ALT 22 04/02/2019   AST 20 04/02/2019   ALKPHOS 60 04/02/2019   BILITOT 0.5 04/02/2019     Review of Systems  Constitutional: Negative for chills, fatigue and fever.  HENT: Negative for congestion, hearing loss, tinnitus, trouble swallowing and voice change.   Eyes: Negative for visual disturbance.  Respiratory: Negative for cough, chest tightness, shortness of breath and wheezing.   Cardiovascular: Negative for chest pain, palpitations and leg swelling.  Gastrointestinal: Negative for abdominal pain, constipation, diarrhea and vomiting.  Endocrine: Negative for polydipsia and polyuria.  Genitourinary: Negative for dysuria, frequency, genital sores, vaginal bleeding and vaginal discharge.  Musculoskeletal: Negative for arthralgias, gait problem and joint swelling.  Skin: Negative for color change and rash.  Neurological: Negative for dizziness, tremors, light-headedness and headaches.  Hematological: Negative for adenopathy. Does not bruise/bleed easily.  Psychiatric/Behavioral: Negative for dysphoric mood and sleep disturbance. The patient is not nervous/anxious.     Patient Active Problem List   Diagnosis Date Noted  . Pulmonary nodule 07/23/2019  . Syncope and collapse 04/08/2019  . Elevated troponin   . Cerebrovascular accident (CVA) (HCC) 12/31/2018  . Seizure disorder as sequela of cerebrovascular accident (HCC) 12/31/2018  . Peripheral vascular disease with stasis dermatitis 04/27/2018  . 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 Essential  hypertension 08/18/2016  . Microalbuminuria 05/24/2016  . Type II diabetes mellitus with complication (HCC) 05/23/2016  .  Hyperlipidemia associated with type 2 diabetes mellitus (HCC) 05/23/2016  . B12 deficiency 05/23/2016  . Short-term memory loss 05/23/2016  . Overweight (BMI 25.0-29.9) 05/23/2016  . Edema of right lower extremity 05/23/2016  . ICH (intracerebral hemorrhage) (HCC) 02/07/2013    No Known Allergies  No past surgical history on file.  Social History   Tobacco Use  . Smoking status: Former Smoker    Packs/day: 0.25    Years: 50.00    Pack years: 12.50    Types: Cigarettes    Quit date: 2009    Years since quitting: 12.6  . Smokeless tobacco: Never Used  . Tobacco comment: smoking cessation materials not required  Vaping Use  . Vaping Use: Never used  Substance Use Topics  . Alcohol use: Yes    Comment: every now and then  . Drug use: Never     Medication list has been reviewed and updated.  No outpatient medications have been marked as taking for the 09/16/19 encounter (Office Visit) with Reubin Milan, MD.    [facility] 2/9 Scores 07/22/2019 08/17/2018 07/16/2018 04/27/2018  PHQ - 2 Score 0 0 0 0  PHQ- 9 Score - 0 - -    No flowsheet data found.  BP Readings from Last 3 Encounters:  07/22/19 120/70  04/08/19 128/78  04/03/19 137/71    Physical Exam Vitals and nursing note reviewed.  Constitutional:      General: She is not in acute distress.    Appearance: She is well-developed.  HENT:     Head: Normocephalic and atraumatic.     Right Ear: Tympanic membrane and ear canal normal.     Left Ear: Tympanic membrane and ear canal normal.     Nose:     Right Sinus: No maxillary sinus tenderness.     Left Sinus: No maxillary sinus tenderness.  Eyes:     General: No scleral icterus.       Right eye: No discharge.        Left eye: No discharge.     Conjunctiva/sclera: Conjunctivae normal.  Neck:     Thyroid: No thyromegaly.     Vascular: No carotid bruit.  Cardiovascular:     Rate and Rhythm: Normal rate and regular rhythm.     Pulses: Normal pulses.     Heart  sounds: Normal heart sounds.  Pulmonary:     Effort: Pulmonary effort is normal. No respiratory distress.     Breath sounds: No wheezing.  Chest:     Breasts:        Right: No mass, nipple discharge, skin change or tenderness.        Left: No mass, nipple discharge, skin change or tenderness.  Abdominal:     General: Bowel sounds are normal.     Palpations: Abdomen is soft.     Tenderness: There is no abdominal tenderness.  Musculoskeletal:     Cervical back: Normal range of motion. No erythema.     Right lower leg: No edema.     Left lower leg: No edema.  Lymphadenopathy:     Cervical: No cervical adenopathy.  Skin:    General: Skin is warm and dry.     Findings: No rash.  Neurological:     Mental Status: She is alert and oriented to person, place, and time.     Cranial Nerves: No cranial nerve  deficit.     Sensory: No sensory deficit.     Deep Tendon Reflexes: Reflexes are normal and symmetric.  Psychiatric:        Attention and Perception: Attention normal.        Mood and Affect: Mood normal.     Wt Readings from Last 3 Encounters:  07/22/19 200 lb 12.8 oz (91.1 kg)  04/08/19 203 lb (92.1 kg)  04/01/19 180 lb (81.6 kg)    There were no vitals taken for this visit.  Assessment and Plan:

## 2019-09-17 DIAGNOSIS — Z0000 Encounter for general adult medical examination without abnormal findings: Secondary | ICD-10-CM | POA: Insufficient documentation

## 2019-09-22 ENCOUNTER — Other Ambulatory Visit: Payer: Self-pay | Admitting: Internal Medicine

## 2019-09-22 DIAGNOSIS — E118 Type 2 diabetes mellitus with unspecified complications: Secondary | ICD-10-CM

## 2019-09-22 NOTE — Telephone Encounter (Signed)
Requested medication (s) are due for refill today: yes  Requested medication (s) are on the active medication list: yes  Last refill:  08/17/18  Future visit scheduled: yes   Notes to clinic:  > 3 months overdue for OV- sending to office to refill or refuse. Called pt and schedule her for CPE. Advised pt to be NPO after midnight.   Requested Prescriptions  Pending Prescriptions Disp Refills   JANUMET 50-500 MG tablet [Pharmacy Med Name: Janumet 50-500 MG Oral Tablet] 60 tablet 0    Sig: TAKE 1 TABLET BY MOUTH TWICE DAILY WITH A MEAL      Endocrinology:  Diabetes - Biguanide + DPP-4 Inhibitor Combos Failed - 09/22/2019  2:26 PM      Failed - Cr in normal range and within 360 days    Creatinine  Date Value Ref Range Status  09/05/2019 1.3 (A) 0.5 - 1.1 Final   Creatinine, Ser  Date Value Ref Range Status  04/02/2019 0.86 0.44 - 1.00 mg/dL Final          Failed - Valid encounter within last 6 months    Recent Outpatient Visits           1 year ago Annual physical exam   [facility] Glean Hess, MD   1 year ago Hyperlipidemia associated with type 2 diabetes mellitus [facility])   [facility] Glean Hess, MD   1 year ago Essential hypertension   [facility] Glean Hess, MD   2 years ago Diabetes mellitus type 2, uncontrolled, without complications [facility])   [facility] Plonk, William, MD   3 years ago Uncontrolled type 2 diabetes mellitus without complication, without long-term current use of insulin [facility])   [facility] Adline Potter, MD       Future Appointments             In 1 week Glean Hess, MD [facility] [place] is between 0 and 7.9 and within 180 days    Hemoglobin A1C  Date Value Ref Range Status  09/05/2019 6.4  Final          Passed - eGFR in normal range and within 360 days    GFR calc Af Amer  Date Value Ref Range Status  04/02/2019 >60 >60 mL/min  Final   GFR calc non Af Amer  Date Value Ref Range Status  09/05/2019 48  Final

## 2019-09-24 ENCOUNTER — Other Ambulatory Visit: Payer: Self-pay | Admitting: Internal Medicine

## 2019-09-24 DIAGNOSIS — E118 Type 2 diabetes mellitus with unspecified complications: Secondary | ICD-10-CM

## 2019-09-25 ENCOUNTER — Other Ambulatory Visit: Payer: Self-pay | Admitting: Internal Medicine

## 2019-09-25 DIAGNOSIS — E118 Type 2 diabetes mellitus with unspecified complications: Secondary | ICD-10-CM

## 2019-09-29 NOTE — Progress Notes (Deleted)
Date:  09/30/2019   Name:  [name]   DOB:  05-Sep-1940   MRN:  [id number]   Chief Complaint: No chief complaint on file. [name] is a 79 y.o. female who presents today for her Complete Annual Exam. She feels {DESC; WELL/FAIRLY WELL/POORLY:18703}. She reports exercising ***. She reports she is sleeping {DESC; WELL/FAIRLY WELL/POORLY:18703}. Breast complaints ***.  Mammogram: declined DEXA: declined Pap smear: discontinued Colonoscopy: aged out Declines all immunizations There is no immunization history for the selected administration types on file for this patient.   Diabetes She presents for her follow-up diabetic visit. She has type 2 diabetes mellitus. Her disease course has been stable. Pertinent negatives for hypoglycemia include no dizziness, headaches, nervousness/anxiousness or tremors. Pertinent negatives for diabetes include no chest pain, no fatigue, no polydipsia and no polyuria. Current diabetic treatment includes oral agent (dual therapy). She is compliant with treatment all of the time. An ACE inhibitor/angiotensin II receptor blocker is being taken. Eye exam is not current.  Hypertension This is a chronic problem. The problem is controlled. Pertinent negatives include no chest pain, headaches, palpitations or shortness of breath. Past treatments include ACE inhibitors and diuretics. The current treatment provides significant improvement.  Hyperlipidemia This is a chronic problem. The problem is controlled. Pertinent negatives include no chest pain or shortness of breath. Current antihyperlipidemic treatment includes statins. The current treatment provides significant improvement of lipids.  Seizure disorder - followed by Neurology. Alos has memory loss and started on Aricept along with Keppra.   Recently tested for deficiencies - Vtiamin D 51; B1 114;  B12 463; Folate >22; TSH 1.7; ferritin 121.   Lab Results  Component Value Date   CREATININE 1.3 (A)  09/05/2019   BUN 20 09/05/2019   NA 139 04/02/2019   K 4.3 04/02/2019   CL 104 04/02/2019   CO2 25 04/02/2019   Lab Results  Component Value Date   CHOL 154 08/17/2018   HDL 47 08/17/2018   LDLCALC 81 08/17/2018   TRIG 128 08/17/2018   CHOLHDL 3.3 08/17/2018   Lab Results  Component Value Date   TSH 1.70 09/05/2019   Lab Results  Component Value Date   HGBA1C 6.4 09/05/2019   Lab Results  Component Value Date   WBC 6.7 09/05/2019   HGB 12.9 09/05/2019   HCT 41 09/05/2019   MCV 88.9 04/02/2019   PLT 192 04/02/2019   Lab Results  Component Value Date   ALT 16 09/05/2019   AST 15 09/05/2019   ALKPHOS 60 04/02/2019   BILITOT 0.5 04/02/2019     Review of Systems  Constitutional: Negative for chills, fatigue and fever.  HENT: Negative for congestion, hearing loss, tinnitus, trouble swallowing and voice change.   Eyes: Negative for visual disturbance.  Respiratory: Negative for cough, chest tightness, shortness of breath and wheezing.   Cardiovascular: Negative for chest pain, palpitations and leg swelling.  Gastrointestinal: Negative for abdominal pain, constipation, diarrhea and vomiting.  Endocrine: Negative for polydipsia and polyuria.  Genitourinary: Negative for dysuria, frequency, genital sores, vaginal bleeding and vaginal discharge.  Musculoskeletal: Negative for arthralgias, gait problem and joint swelling.  Skin: Negative for color change and rash.  Neurological: Negative for dizziness, tremors, light-headedness and headaches.  Hematological: Negative for adenopathy. Does not bruise/bleed easily.  Psychiatric/Behavioral: Negative for dysphoric mood and sleep disturbance. The patient is not nervous/anxious.     Patient Active Problem List   Diagnosis Date Noted  . Annual physical exam 09/17/2019  .  Pulmonary nodule 07/23/2019  . Syncope and collapse 04/08/2019  . Elevated troponin   . Cerebrovascular accident (CVA) (HCC) 12/31/2018  . Seizure disorder  as sequela of cerebrovascular accident (HCC) 12/31/2018  . Peripheral vascular disease with stasis dermatitis 04/27/2018  . 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 Essential hypertension 08/18/2016  . Microalbuminuria 05/24/2016  . Type II diabetes mellitus with complication (HCC) 05/23/2016  . Hyperlipidemia associated with type 2 diabetes mellitus (HCC) 05/23/2016  . B12 deficiency 05/23/2016  . Short-term memory loss 05/23/2016  . Overweight (BMI 25.0-29.9) 05/23/2016  . Edema of right lower extremity 05/23/2016  . ICH (intracerebral hemorrhage) (HCC) 02/07/2013    No Known Allergies  No past surgical history on file.  Social History   Tobacco Use  . Smoking status: Former Smoker    Packs/day: 0.25    Years: 50.00    Pack years: 12.50    Types: Cigarettes    Quit date: 2009    Years since quitting: 12.6  . Smokeless tobacco: Never Used  . Tobacco comment: smoking cessation materials not required  Vaping Use  . Vaping Use: Never used  Substance Use Topics  . Alcohol use: Yes    Comment: every now and then  . Drug use: Never     Medication list has been reviewed and updated.  No outpatient medications have been marked as taking for the 09/30/19 encounter (Office Visit) with Reubin Milan, MD.    [facility] 2/9 Scores 07/22/2019 08/17/2018 07/16/2018 04/27/2018  PHQ - 2 Score 0 0 0 0  PHQ- 9 Score - 0 - -    No flowsheet data found.  BP Readings from Last 3 Encounters:  07/22/19 120/70  04/08/19 128/78  04/03/19 137/71    Physical Exam Vitals and nursing note reviewed.  Constitutional:      General: She is not in acute distress.    Appearance: She is well-developed.  HENT:     Head: Normocephalic and atraumatic.     Right Ear: Tympanic membrane and ear canal normal.     Left Ear: Tympanic membrane and ear canal normal.     Nose:     Right Sinus: No maxillary sinus tenderness.     Left Sinus: No maxillary sinus tenderness.  Eyes:     General: No scleral icterus.        Right eye: No discharge.        Left eye: No discharge.     Conjunctiva/sclera: Conjunctivae normal.  Neck:     Thyroid: No thyromegaly.     Vascular: No carotid bruit.  Cardiovascular:     Rate and Rhythm: Normal rate and regular rhythm.     Pulses: Normal pulses.     Heart sounds: Normal heart sounds.  Pulmonary:     Effort: Pulmonary effort is normal. No respiratory distress.     Breath sounds: No wheezing.  Chest:     Breasts:        Right: No mass, nipple discharge, skin change or tenderness.        Left: No mass, nipple discharge, skin change or tenderness.  Abdominal:     General: Bowel sounds are normal.     Palpations: Abdomen is soft.     Tenderness: There is no abdominal tenderness.  Musculoskeletal:     Cervical back: Normal range of motion. No erythema.     Right lower leg: No edema.     Left lower leg: No edema.  Lymphadenopathy:  Cervical: No cervical adenopathy.  Skin:    General: Skin is warm and dry.     Findings: No rash.  Neurological:     Mental Status: She is alert and oriented to person, place, and time.     Cranial Nerves: No cranial nerve deficit.     Sensory: No sensory deficit.     Deep Tendon Reflexes: Reflexes are normal and symmetric.  Psychiatric:        Attention and Perception: Attention normal.        Mood and Affect: Mood normal.     Wt Readings from Last 3 Encounters:  07/22/19 200 lb 12.8 oz (91.1 kg)  04/08/19 203 lb (92.1 kg)  04/01/19 180 lb (81.6 kg)    There were no vitals taken for this visit.  Assessment and Plan:

## 2019-09-30 ENCOUNTER — Encounter: Payer: Self-pay | Admitting: Internal Medicine

## 2019-10-23 ENCOUNTER — Other Ambulatory Visit: Payer: Self-pay | Admitting: Internal Medicine

## 2019-10-23 DIAGNOSIS — E785 Hyperlipidemia, unspecified: Secondary | ICD-10-CM

## 2019-10-23 DIAGNOSIS — I10 Essential (primary) hypertension: Secondary | ICD-10-CM

## 2019-10-23 NOTE — Telephone Encounter (Signed)
Requested medication (s) are due for refill today: Yes  Requested medication (s) are on the active medication list: Yes  Last refill:  08/17/18  Future visit scheduled: No  Notes to clinic:  Prescriptions have expired.    Requested Prescriptions  Pending Prescriptions Disp Refills   lisinopril-hydrochlorothiazide (ZESTORETIC) 10-12.5 MG tablet [Pharmacy Med Name: Lisinopril-hydroCHLOROthiazide 10-12.5 MG Oral Tablet] 90 tablet 0    Sig: Take 1 tablet by mouth once daily      Cardiovascular:  ACEI + Diuretic Combos Failed - 10/23/2019 12:15 PM      Failed - Na in normal range and within 180 days    Sodium  Date Value Ref Range Status  04/02/2019 139 135 - 145 mmol/L Final  08/17/2018 139 134 - 144 mmol/L Final          Failed - K in normal range and within 180 days    Potassium  Date Value Ref Range Status  04/02/2019 4.3 3.5 - 5.1 mmol/L Final          Failed - Cr in normal range and within 180 days    Creatinine  Date Value Ref Range Status  09/05/2019 1.3 (A) 0.5 - 1.1 Final   Creatinine, Ser  Date Value Ref Range Status  04/02/2019 0.86 0.44 - 1.00 mg/dL Final          Failed - Valid encounter within last 6 months    Recent Outpatient Visits           1 year ago Annual physical exam   [facility] Reubin Milan, MD   1 year ago Hyperlipidemia associated with type 2 diabetes mellitus [facility])   [facility] Reubin Milan, MD   1 year ago Essential hypertension   [facility] Reubin Milan, MD   2 years ago Diabetes mellitus type 2, uncontrolled, without complications [facility])   [facility] Plonk, Chrissie Noa, MD   3 years ago Uncontrolled type 2 diabetes mellitus without complication, without long-term current use of insulin (HCC)   [facility] Plonk, Chrissie Noa, MD              Passed - Ca in normal range and within 180 days    Calcium  Date Value Ref Range Status  09/05/2019 10.5 8.7 - 10.7 Final           Passed - Patient is not pregnant      Passed - Last BP in normal range    BP Readings from Last 1 Encounters:  07/22/19 120/70            pravastatin (PRAVACHOL) 40 MG tablet [Pharmacy Med Name: Pravastatin Sodium 40 MG Oral Tablet] 90 tablet 0    Sig: Take 1 tablet by mouth once daily      Cardiovascular:  Antilipid - Statins Failed - 10/23/2019 12:15 PM      Failed - Total Cholesterol in normal range and within 360 days    Cholesterol, Total  Date Value Ref Range Status  08/17/2018 154 100 - 199 mg/dL Final          Failed - LDL in normal range and within 360 days    LDL Calculated  Date Value Ref Range Status  08/17/2018 81 0 - 99 mg/dL Final          Failed - HDL in normal range and within 360 days    HDL  Date Value Ref Range Status  08/17/2018 47 >39  mg/dL Final          Failed - Triglycerides in normal range and within 360 days    Triglycerides  Date Value Ref Range Status  08/17/2018 128 0 - 149 mg/dL Final          Failed - Valid encounter within last 12 months    Recent Outpatient Visits           1 year ago Annual physical exam   [facility] Reubin Milan, MD   1 year ago Hyperlipidemia associated with type 2 diabetes mellitus [facility])   [facility] Reubin Milan, MD   1 year ago Essential hypertension   [facility] Reubin Milan, MD   2 years ago Diabetes mellitus type 2, uncontrolled, without complications [facility])   [facility] Plonk, William, MD   3 years ago Uncontrolled type 2 diabetes mellitus without complication, without long-term current use of insulin [facility])   [facility] Schuyler Amor, MD              Passed - Patient is not pregnant

## 2019-12-12 ENCOUNTER — Telehealth: Payer: Self-pay | Admitting: Internal Medicine

## 2019-12-12 DIAGNOSIS — I10 Essential (primary) hypertension: Secondary | ICD-10-CM

## 2019-12-12 DIAGNOSIS — E1169 Type 2 diabetes mellitus with other specified complication: Secondary | ICD-10-CM

## 2019-12-12 NOTE — Telephone Encounter (Signed)
Requested medication (s) are due for refill today: yes  Requested medication (s) are on the active medication list: yes  Last refill:  10/23/19 #30 0 refills   Future visit scheduled: no   Notes to clinic:  attempted to schedule appt. Patient last seen 1 year ago. Overdue labs 08/17/2018. Patient reports she only has 2 pills left of zestoretic and pravachol. Please notify patient's husband if Rx can have courtesy refill.     Requested Prescriptions  Pending Prescriptions Disp Refills   lisinopril-hydrochlorothiazide (ZESTORETIC) 10-12.5 MG tablet [Pharmacy Med Name: Lisinopril-hydroCHLOROthiazide 10-12.5 MG Oral Tablet] 30 tablet 0    Sig: Take 1 tablet by mouth once daily      Cardiovascular:  ACEI + Diuretic Combos Failed - 12/12/2019  5:53 PM      Failed - Na in normal range and within 180 days    Sodium  Date Value Ref Range Status  04/02/2019 139 135 - 145 mmol/L Final  08/17/2018 139 134 - 144 mmol/L Final          Failed - K in normal range and within 180 days    Potassium  Date Value Ref Range Status  04/02/2019 4.3 3.5 - 5.1 mmol/L Final          Failed - Cr in normal range and within 180 days    Creatinine  Date Value Ref Range Status  09/05/2019 1.3 (A) 0.5 - 1.1 Final   Creatinine, Ser  Date Value Ref Range Status  04/02/2019 0.86 0.44 - 1.00 mg/dL Final          Failed - Valid encounter within last 6 months    Recent Outpatient Visits           1 year ago Annual physical exam   [facility] Reubin Milan, MD   1 year ago Hyperlipidemia associated with type 2 diabetes mellitus [facility])   [facility] Reubin Milan, MD   1 year ago Essential hypertension   [facility] Reubin Milan, MD   2 years ago Diabetes mellitus type 2, uncontrolled, without complications [facility])   [facility] Plonk, Chrissie Noa, MD   3 years ago Uncontrolled type 2 diabetes mellitus without complication, without long-term current use of  insulin (HCC)   [facility] Plonk, Chrissie Noa, MD              Passed - Ca in normal range and within 180 days    Calcium  Date Value Ref Range Status  09/05/2019 10.5 8.7 - 10.7 Final          Passed - Patient is not pregnant      Passed - Last BP in normal range    BP Readings from Last 1 Encounters:  07/22/19 120/70            pravastatin (PRAVACHOL) 40 MG tablet [Pharmacy Med Name: Pravastatin Sodium 40 MG Oral Tablet] 30 tablet 0    Sig: Take 1 tablet by mouth once daily      Cardiovascular:  Antilipid - Statins Failed - 12/12/2019  5:53 PM      Failed - Total Cholesterol in normal range and within 360 days    Cholesterol, Total  Date Value Ref Range Status  08/17/2018 154 100 - 199 mg/dL Final          Failed - LDL in normal range and within 360 days    LDL Calculated  Date Value Ref Range Status  08/17/2018 81 0 - 99 mg/dL Final          Failed - HDL in normal range and within 360 days    HDL  Date Value Ref Range Status  08/17/2018 47 >39 mg/dL Final          Failed - Triglycerides in normal range and within 360 days    Triglycerides  Date Value Ref Range Status  08/17/2018 128 0 - 149 mg/dL Final          Failed - Valid encounter within last 12 months    Recent Outpatient Visits           1 year ago Annual physical exam   [facility] Reubin Milan, MD   1 year ago Hyperlipidemia associated with type 2 diabetes mellitus [facility])   [facility] Reubin Milan, MD   1 year ago Essential hypertension   [facility] Reubin Milan, MD   2 years ago Diabetes mellitus type 2, uncontrolled, without complications [facility])   [facility] Plonk, William, MD   3 years ago Uncontrolled type 2 diabetes mellitus without complication, without long-term current use of insulin [facility])   [facility] Schuyler Amor, MD              Passed - Patient is not pregnant

## 2019-12-13 ENCOUNTER — Other Ambulatory Visit: Payer: Self-pay | Admitting: Internal Medicine

## 2019-12-13 DIAGNOSIS — E1169 Type 2 diabetes mellitus with other specified complication: Secondary | ICD-10-CM

## 2019-12-13 DIAGNOSIS — E785 Hyperlipidemia, unspecified: Secondary | ICD-10-CM

## 2019-12-13 DIAGNOSIS — I10 Essential (primary) hypertension: Secondary | ICD-10-CM

## 2019-12-13 NOTE — Telephone Encounter (Signed)
Pt husband is calling and said [facility] does not have rx's and please send to [facility] [address]

## 2019-12-13 NOTE — Telephone Encounter (Addendum)
Courtesy RF was sent to [facility]. Per note patient has decided to change providers- sent to office for review

## 2019-12-13 NOTE — Telephone Encounter (Signed)
Pt will be seeing a new provider per husband

## 2020-02-11 ENCOUNTER — Telehealth: Payer: Self-pay | Admitting: *Deleted

## 2020-02-11 NOTE — Telephone Encounter (Signed)
Attempted to contact pt to review referral and upcoming appts in the lung nodule clinic. Pt did not answer and unable to leave message. Will try again at a later date.

## 2020-02-19 ENCOUNTER — Other Ambulatory Visit: Payer: Self-pay | Admitting: Internal Medicine

## 2020-02-19 DIAGNOSIS — I10 Essential (primary) hypertension: Secondary | ICD-10-CM

## 2020-02-19 DIAGNOSIS — E785 Hyperlipidemia, unspecified: Secondary | ICD-10-CM

## 2020-02-19 DIAGNOSIS — E1169 Type 2 diabetes mellitus with other specified complication: Secondary | ICD-10-CM

## 2020-02-19 NOTE — Telephone Encounter (Signed)
}  Notes to clinic : Patient keeps requesting a 90 day fill Looks like patient needs appt and has decide to change providers Review for change    Requested Prescriptions  Pending Prescriptions Disp Refills   pravastatin (PRAVACHOL) 40 MG tablet [Pharmacy Med Name: PRAVASTATIN 40MG  TABLETS] 90 tablet     Sig: Take 1 tablet by mouth once daily      Cardiovascular:  Antilipid - Statins Failed - 02/19/2020 12:45 PM      Failed - Total Cholesterol in normal range and within 360 days    Cholesterol, Total  Date Value Ref Range Status  08/17/2018 154 100 - 199 mg/dL Final          Failed - LDL in normal range and within 360 days    LDL Calculated  Date Value Ref Range Status  08/17/2018 81 0 - 99 mg/dL Final          Failed - HDL in normal range and within 360 days    HDL  Date Value Ref Range Status  08/17/2018 47 >39 mg/dL Final          Failed - Triglycerides in normal range and within 360 days    Triglycerides  Date Value Ref Range Status  08/17/2018 128 0 - 149 mg/dL Final          Failed - Valid encounter within last 12 months    Recent Outpatient Visits           1 year ago Annual physical exam   [facility], MD   1 year ago Hyperlipidemia associated with type 2 diabetes mellitus [facility])   [facility], MD   2 years ago Essential hypertension   [facility], MD   2 years ago Diabetes mellitus type 2, uncontrolled, without complications [facility])   [facility] Plonk, William, MD   3 years ago Uncontrolled type 2 diabetes mellitus without complication, without long-term current use of insulin (HCC)   [facility] Plonk, [facility], MD                Passed - Patient is not pregnant        lisinopril-hydrochlorothiazide (ZESTORETIC) 10-12.5 MG tablet [Pharmacy Med Name: LISINOPRIL-HCTZ 10/12.5MG  TABLETS] 90 tablet     Sig: Take 1 tablet by mouth once daily       Cardiovascular:  ACEI + Diuretic Combos Failed - 02/19/2020 12:45 PM      Failed - Na in normal range and within 180 days    Sodium  Date Value Ref Range Status  04/02/2019 139 135 - 145 mmol/L Final  08/17/2018 139 134 - 144 mmol/L Final          Failed - K in normal range and within 180 days    Potassium  Date Value Ref Range Status  04/02/2019 4.3 3.5 - 5.1 mmol/L Final          Failed - Cr in normal range and within 180 days    Creatinine  Date Value Ref Range Status  09/05/2019 1.3 (A) 0.5 - 1.1 Final   Creatinine, Ser  Date Value Ref Range Status  04/02/2019 0.86 0.44 - 1.00 mg/dL Final          Failed - Valid encounter within last 6 months    Recent Outpatient Visits           1 year ago Annual physical exam  [facility] Reubin Milan, MD   1 year ago Hyperlipidemia associated with type 2 diabetes mellitus [facility])   [facility] Reubin Milan, MD   2 years ago Essential hypertension   [facility] Reubin Milan, MD   2 years ago Diabetes mellitus type 2, uncontrolled, without complications [facility])   [facility] Plonk, Chrissie Noa, MD   3 years ago Uncontrolled type 2 diabetes mellitus without complication, without long-term current use of insulin [facility])   [facility] Plonk, Chrissie Noa, MD                Passed - Ca in normal range and within 180 days    Calcium  Date Value Ref Range Status  09/05/2019 10.5 8.7 - 10.7 Final          Passed - Patient is not pregnant      Passed - Last BP in normal range    BP Readings from Last 1 Encounters:  07/22/19 120/70

## 2020-02-21 ENCOUNTER — Telehealth: Payer: Self-pay | Admitting: *Deleted

## 2020-02-21 ENCOUNTER — Other Ambulatory Visit: Payer: Self-pay | Admitting: Internal Medicine

## 2020-02-21 DIAGNOSIS — E785 Hyperlipidemia, unspecified: Secondary | ICD-10-CM

## 2020-02-21 DIAGNOSIS — I10 Essential (primary) hypertension: Secondary | ICD-10-CM

## 2020-02-21 DIAGNOSIS — E1169 Type 2 diabetes mellitus with other specified complication: Secondary | ICD-10-CM

## 2020-02-21 NOTE — Telephone Encounter (Signed)
Called pt to review and discuss referral to the lung nodule clinic. Informed that it is recommended to have follow up CT scan 12 months from her last scan to follow up on lung nodule that was incidentally found during ED visit in Feb 2021. Pt stated that she is not interested at this time in scheduling follow up but requests a call back in a few months to schedule follow up at that time. Will call back in March 2022 to follow up with patient.

## 2020-04-03 ENCOUNTER — Other Ambulatory Visit: Payer: Self-pay | Admitting: Internal Medicine

## 2020-04-03 ENCOUNTER — Telehealth: Payer: Self-pay | Admitting: Internal Medicine

## 2020-04-03 ENCOUNTER — Other Ambulatory Visit: Payer: Self-pay

## 2020-04-03 ENCOUNTER — Telehealth: Payer: Self-pay

## 2020-04-03 ENCOUNTER — Other Ambulatory Visit
Admission: RE | Admit: 2020-04-03 | Discharge: 2020-04-03 | Disposition: A | Discharge status: Home / Self Care | Payer: Medicare HMO | Attending: Internal Medicine | Admitting: Internal Medicine

## 2020-04-03 ENCOUNTER — Encounter: Payer: Self-pay | Admitting: Internal Medicine

## 2020-04-03 ENCOUNTER — Ambulatory Visit (INDEPENDENT_AMBULATORY_CARE_PROVIDER_SITE_OTHER): Payer: Medicare HMO | Admitting: Internal Medicine

## 2020-04-03 VITALS — BP 130/80 | HR 66 | Temp 98.0°F | Ht 69.0 in | Wt 195.0 lb

## 2020-04-03 DIAGNOSIS — E118 Type 2 diabetes mellitus with unspecified complications: Secondary | ICD-10-CM | POA: Diagnosis not present

## 2020-04-03 DIAGNOSIS — E785 Hyperlipidemia, unspecified: Secondary | ICD-10-CM

## 2020-04-03 DIAGNOSIS — I10 Essential (primary) hypertension: Secondary | ICD-10-CM

## 2020-04-03 DIAGNOSIS — R569 Unspecified convulsions: Secondary | ICD-10-CM | POA: Diagnosis not present

## 2020-04-03 DIAGNOSIS — L03116 Cellulitis of left lower limb: Secondary | ICD-10-CM

## 2020-04-03 DIAGNOSIS — E1169 Type 2 diabetes mellitus with other specified complication: Secondary | ICD-10-CM | POA: Insufficient documentation

## 2020-04-03 DIAGNOSIS — E1151 Type 2 diabetes mellitus with diabetic peripheral angiopathy without gangrene: Secondary | ICD-10-CM

## 2020-04-03 LAB — CBC WITH DIFFERENTIAL/PLATELET
Abs Immature Granulocytes: 0.01 10*3/uL (ref 0.00–0.07)
Basophils Absolute: 0.1 10*3/uL (ref 0.0–0.1)
Basophils Relative: 1 %
Eosinophils Absolute: 0.2 10*3/uL (ref 0.0–0.5)
Eosinophils Relative: 4 %
HCT: 40.5 % (ref 36.0–46.0)
Hemoglobin: 13.2 g/dL (ref 12.0–15.0)
Immature Granulocytes: 0 %
Lymphocytes Relative: 42 %
Lymphs Abs: 2.2 10*3/uL (ref 0.7–4.0)
MCH: 28.5 pg (ref 26.0–34.0)
MCHC: 32.6 g/dL (ref 30.0–36.0)
MCV: 87.5 fL (ref 80.0–100.0)
Monocytes Absolute: 0.3 10*3/uL (ref 0.1–1.0)
Monocytes Relative: 6 %
Neutro Abs: 2.5 10*3/uL (ref 1.7–7.7)
Neutrophils Relative %: 47 %
Platelets: 235 10*3/uL (ref 150–400)
RBC: 4.63 MIL/uL (ref 3.87–5.11)
RDW: 14.2 % (ref 11.5–15.5)
WBC: 5.3 10*3/uL (ref 4.0–10.5)
nRBC: 0 % (ref 0.0–0.2)

## 2020-04-03 LAB — LIPID PANEL
Cholesterol: 199 mg/dL (ref 0–200)
HDL: 56 mg/dL (ref >40)
LDL Cholesterol: 120 mg/dL — ABNORMAL HIGH (ref 0–99)
Total CHOL/HDL Ratio: 3.6 RATIO
Triglycerides: 117 mg/dL (ref <150)
VLDL: 23 mg/dL (ref 0–40)

## 2020-04-03 LAB — COMPREHENSIVE METABOLIC PANEL
ALT: 18 U/L (ref 0–44)
AST: 19 U/L (ref 15–41)
Albumin: 4.4 g/dL (ref 3.5–5.0)
Alkaline Phosphatase: 68 U/L (ref 38–126)
Anion gap: 11 (ref 5–15)
BUN: 16 mg/dL (ref 8–23)
CO2: 26 mmol/L (ref 22–32)
Calcium: 10.3 mg/dL (ref 8.9–10.3)
Chloride: 101 mmol/L (ref 98–111)
Creatinine, Ser: 1 mg/dL (ref 0.44–1.00)
GFR, Estimated: 57 mL/min — ABNORMAL LOW (ref >60)
Glucose, Bld: 97 mg/dL (ref 70–99)
Potassium: 4.1 mmol/L (ref 3.5–5.1)
Sodium: 138 mmol/L (ref 135–145)
Total Bilirubin: 0.5 mg/dL (ref 0.3–1.2)
Total Protein: 8.7 g/dL — ABNORMAL HIGH (ref 6.5–8.1)

## 2020-04-03 LAB — HEMOGLOBIN A1C
Hgb A1c MFr Bld: 6.5 % — ABNORMAL HIGH (ref 4.8–5.6)
Mean Plasma Glucose: 139.85 mg/dL

## 2020-04-03 MED ORDER — AMOXICILLIN-POT CLAVULANATE 875-125 MG PO TABS: 1.0000 | ORAL_TABLET | Freq: Two times a day (BID) | ORAL | 0 refills | Status: AC

## 2020-04-03 MED ORDER — MUPIROCIN 2 % EX OINT: 1.0000 "application " | TOPICAL_OINTMENT | Freq: Two times a day (BID) | CUTANEOUS | 0 refills | Status: DC

## 2020-04-03 MED ORDER — LEVETIRACETAM 500 MG PO TABS: 500.0000 mg | ORAL_TABLET | Freq: Two times a day (BID) | ORAL | 5 refills | Status: DC

## 2020-04-03 MED ORDER — MUPIROCIN CALCIUM 2 % EX CREA: 1.0000 "application " | TOPICAL_CREAM | Freq: Two times a day (BID) | CUTANEOUS | 0 refills | Status: DC

## 2020-04-03 NOTE — Telephone Encounter (Signed)
Copied from CRM [id number]. Topic: Referral - Status >> Apr 03, 2020  4:15 PM Randol Kern wrote: Linton Rump, [facility] in [place], called to report that the patient cannot be seen at their facility she has been a no show for scheduled appt 3 consecutive times. Please resubmit referral elsewhere.

## 2020-04-03 NOTE — Progress Notes (Signed)
Date:  04/03/2020   Name:  [name]   DOB:  [birth year]   MRN:  [id number]  Seen today in conjunction with her husband [name]. Chief Complaint: Referral (Ophthalmologist- for glasses )  Hypertension This is a chronic problem. The problem is controlled. Pertinent negatives include no chest pain, headaches or shortness of breath. Past treatments include ACE inhibitors and diuretics. The current treatment provides significant improvement. Hypertensive end-organ damage includes kidney disease.  Diabetes She presents for her follow-up diabetic visit. She has type 2 diabetes mellitus. Her disease course has been stable. Pertinent negatives for hypoglycemia include no dizziness, headaches or nervousness/anxiousness. Associated symptoms include foot ulcerations and visual change. Pertinent negatives for diabetes include no chest pain, no foot paresthesias and no weight loss. Current diabetic treatment includes oral agent (dual therapy) (janumet). She is compliant with treatment most of the time.  Foot wound - she has a wound on the inside of her left foot.  It is not clear how long it has been present.  She would not have mentioned it but we asked her to take off her socks for a foot exam. She is cleaning it with peroxide but not applying any ointment.  She has noticed some drainage on her sock.  Lab Results  Component Value Date   CREATININE 1.3 (A) 09/05/2019   BUN 20 09/05/2019   NA 139 04/02/2019   K 4.3 04/02/2019   CL 104 04/02/2019   CO2 25 04/02/2019   Lab Results  Component Value Date   CHOL 154 08/17/2018   HDL 47 08/17/2018   LDLCALC 81 08/17/2018   TRIG 128 08/17/2018   CHOLHDL 3.3 08/17/2018   Lab Results  Component Value Date   TSH 1.70 09/05/2019   Lab Results  Component Value Date   HGBA1C 6.4 09/05/2019   Lab Results  Component Value Date   WBC 6.7 09/05/2019   HGB 12.9 09/05/2019   HCT 41 09/05/2019   MCV 88.9 04/02/2019   PLT 192 04/02/2019    Lab Results  Component Value Date   ALT 16 09/05/2019   AST 15 09/05/2019   ALKPHOS 60 04/02/2019   BILITOT 0.5 04/02/2019     Review of Systems  Constitutional: Negative for chills, diaphoresis, fever, unexpected weight change and weight loss.  HENT: Negative for trouble swallowing.   Respiratory: Negative for chest tightness and shortness of breath.   Cardiovascular: Positive for leg swelling. Negative for chest pain.  Genitourinary: Negative for difficulty urinating.  Skin: Positive for wound.  Neurological: Negative for dizziness and headaches.  Psychiatric/Behavioral: Positive for decreased concentration. Negative for dysphoric mood and sleep disturbance. The patient is not nervous/anxious.     Patient Active Problem List   Diagnosis Date Noted  . Annual physical exam 09/17/2019  . Pulmonary nodule 07/23/2019  . Syncope and collapse 04/08/2019  . Elevated troponin   . Cerebrovascular accident (CVA) ([place]) 12/31/2018  . Seizure disorder as sequela of cerebrovascular accident ([place]) 12/31/2018  . Peripheral vascular disease with stasis dermatitis 04/27/2018  . 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 Essential hypertension 08/18/2016  . Microalbuminuria 05/24/2016  . Type II diabetes mellitus with complication ([place]) 67/01/4579  . Hyperlipidemia associated with type 2 diabetes mellitus ([place]) 05/23/2016  . B12 deficiency 05/23/2016  . Short-term memory loss 05/23/2016  . Overweight (BMI 25.0-29.9) 05/23/2016  . Edema of right lower extremity 05/23/2016  . ICH (intracerebral hemorrhage) ([place]) 02/07/2013    No Known Allergies  History reviewed. No pertinent  surgical history.  Social History   Tobacco Use  . Smoking status: Former Smoker    Packs/day: 0.25    Years: 50.00    Pack years: 12.50    Types: Cigarettes    Quit date: 2009    Years since quitting: 13.1  . Smokeless tobacco: Never Used  . Tobacco comment: smoking cessation materials not required  Vaping Use   . Vaping Use: Never used  Substance Use Topics  . Alcohol use: Yes    Comment: every now and then  . Drug use: Never     Medication list has been reviewed and updated.  Current Meds  Medication Sig  . Blood Glucose Monitoring Suppl (ACCU-CHEK GUIDE ME) w/Device KIT USE UP TO FOUR TIMES DAILY AS DIRECTED  . donepezil (ARICEPT) 5 MG tablet   . glucose blood test strip Use per insurance brand. Diagnosis:Diabetes Mellitus 2 Uncontrolled without Complications = W09. 65. Check Blood Sugar up to four times daily.  Marland Kitchen JANUMET 50-500 MG tablet TAKE 1 TABLET BY MOUTH TWICE DAILY WITH A MEAL  . Lancets MISC Use per insurance brand. To use with Glucometer to monitor sugar up to four times daily.  Marland Kitchen levETIRAcetam (KEPPRA) 500 MG tablet   . lisinopril-hydrochlorothiazide (ZESTORETIC) 10-12.5 MG tablet Take 1 tablet by mouth once daily  . pravastatin (PRAVACHOL) 40 MG tablet Take 40 mg by mouth daily.  . Vitamin D, Cholecalciferol, 25 MCG (1000 UT) CAPS Take 1 capsule by mouth daily.  . [DISCONTINUED] pravastatin (PRAVACHOL) 40 MG tablet Take 1 tablet by mouth once daily    PHQ 2/9 Scores 04/03/2020 07/22/2019 08/17/2018 07/16/2018  PHQ - 2 Score 0 0 0 0  PHQ- 9 Score 0 - 0 -    GAD 7 : Generalized Anxiety Score 04/03/2020  Nervous, Anxious, on Edge 0  Control/stop worrying 0  Worry too much - different things 0  Trouble relaxing 0  Restless 0  Easily annoyed or irritable 0  Afraid - awful might happen 0  Total GAD 7 Score 0    BP Readings from Last 3 Encounters:  04/03/20 130/80  07/22/19 120/70  04/08/19 128/78    Physical Exam Vitals and nursing note reviewed.  Constitutional:      General: She is not in acute distress.    Appearance: She is well-developed. She is obese.  HENT:     Head: Normocephalic and atraumatic.  Cardiovascular:     Rate and Rhythm: Normal rate and regular rhythm.     Pulses: Normal pulses.  Pulmonary:     Effort: Pulmonary effort is normal. No  respiratory distress.     Breath sounds: No wheezing or rhonchi.  Musculoskeletal:     Cervical back: Normal range of motion.  Feet:     Comments: DP 1+ bilat unable to palpate PT Thickened skin with cracking medial left foot - odor and thick purulent drainage noted. No open skin on right foot Lymphadenopathy:     Cervical: No cervical adenopathy.  Skin:    General: Skin is warm and dry.     Findings: No rash.  Neurological:     Mental Status: She is alert and oriented to person, place, and time.  Psychiatric:        Mood and Affect: Mood normal.        Behavior: Behavior normal.     Wt Readings from Last 3 Encounters:  04/03/20 195 lb (88.5 kg)  07/22/19 200 lb 12.8 oz (91.1 kg)  04/08/19 203  lb (92.1 kg)    BP 130/80   Pulse 66   Temp 98 F (36.7 C) (Oral)   Ht '5\' 9"'  (1.753 m)   Wt 195 lb (88.5 kg)   SpO2 98%   BMI 28.80 kg/m   Assessment and Plan: 1. Type II diabetes mellitus with complication (HCC) Continue current medications Check labs and advised Patient requested ophthalmology referral - Comprehensive metabolic panel - Hemoglobin A1c - Ambulatory referral to Ophthalmology  2. Essential hypertension Clinically stable exam with well controlled BP. Tolerating medications without side effects at this time. Pt to continue current regimen and low sodium diet; benefits of regular exercise as able discussed.  3. Seizure ([place]) Seen at [facility] ER with seizure and started on Keppra - she is currently in need of a refill No Neurology follow up is planned. She has seen Dr. Melrose Nakayama and Michaelyn Barter in the past - levETIRAcetam (KEPPRA) 500 MG tablet; Take 1 tablet (500 mg total) by mouth 2 (two) times daily.  Dispense: 60 tablet; Refill: 5  4. Type II diabetes mellitus with peripheral circulatory disorder (HCC) Now with foot ulcer/infection of unclear extent  5. Cellulitis of left lower extremity Local care advise given to patient and husband Begin antibiotics Xray  to rule out deep tissue or bone involvement Follow up in 2 weeks - amoxicillin-clavulanate (AUGMENTIN) 875-125 MG tablet; Take 1 tablet by mouth 2 (two) times daily for 10 days.  Dispense: 20 tablet; Refill: 0 - DG Foot Complete Left; Future - CBC with Differential/Platelet - mupirocin cream (BACTROBAN) 2 %; Apply 1 application topically 2 (two) times daily. To wound on foot  Dispense: 15 g; Refill: 0  6. Hyperlipidemia associated with type 2 diabetes mellitus ([place]) Continue statin therapy - Lipid panel   Partially dictated using [employer]. Any errors are unintentional.  Halina Maidens, MD [facility]  04/03/2020

## 2020-04-03 NOTE — Telephone Encounter (Signed)
Noted all medications listed is on pts current medication list.   KP

## 2020-04-03 NOTE — Telephone Encounter (Signed)
Pt's husband is calling to report to Dr. Judithann Graves that the Pt is taking the following medications.  JANUMET 50-500 MG tablet [[id number]] ,pravastatin (PRAVACHOL) 40 MG tablet [[id number]] ,lisinopril-hydrochlorothiazide (ZESTORETIC) 10-12.5 MG tablet [[id number]] , levETIRAcetam (KEPPRA) 500 MG tablet [[id number]] ,donepezil (ARICEPT) 5 MG tablet [[id number]] ,

## 2020-04-03 NOTE — Patient Instructions (Signed)
Cleanse wound with warm water only and pat dry.  Do not use alcohol or hydrogen peroxide.  Apply Mupirocin cream twice a day and cover with dressing.

## 2020-04-06 ENCOUNTER — Other Ambulatory Visit: Payer: Self-pay | Admitting: Internal Medicine

## 2020-04-06 ENCOUNTER — Other Ambulatory Visit: Payer: Medicare HMO

## 2020-04-06 DIAGNOSIS — E118 Type 2 diabetes mellitus with unspecified complications: Secondary | ICD-10-CM

## 2020-04-06 MED ORDER — JANUMET 50-500 MG PO TABS: 1.0000 | ORAL_TABLET | Freq: Two times a day (BID) | ORAL | 1 refills | Status: DC

## 2020-04-06 NOTE — Telephone Encounter (Signed)
Medication Refill - Medication:   JANUMET 50-500 MG tablet     Preferred Pharmacy (with phone number or street name):  [id number] - [place], [place] - [address] [place] [address] AT [facility] [address] Phone:  [phone]  Fax:  [phone]       Agent: Please be advised that RX refills may take up to 3 business days. We ask that you follow-up with your pharmacy.

## 2020-04-06 NOTE — Telephone Encounter (Signed)
Requested Prescriptions  Pending Prescriptions Disp Refills  . sitaGLIPtin-metformin (JANUMET) 50-500 MG tablet 180 tablet 1    Sig: Take 1 tablet by mouth 2 (two) times daily with a meal.     Endocrinology:  Diabetes - Biguanide + DPP-4 Inhibitor Combos Failed - 04/06/2020  3:08 PM      Failed - AA eGFR in normal range and within 360 days    GFR calc Af Amer  Date Value Ref Range Status  04/02/2019 >60 >60 mL/min Final   GFR, Estimated  Date Value Ref Range Status  04/03/2020 57 (L) >60 mL/min Final    Comment:    (NOTE) Calculated using the CKD-EPI Creatinine Equation (2021)          Passed - HBA1C is between 0 and 7.9 and within 180 days    Hemoglobin A1C  Date Value Ref Range Status  09/05/2019 6.4  Final   Hgb A1c MFr Bld  Date Value Ref Range Status  04/03/2020 6.5 (H) 4.8 - 5.6 % Final    Comment:    (NOTE) Pre diabetes:          5.7%-6.4%  Diabetes:              >6.4%  Glycemic control for   <7.0% adults with diabetes          Passed - Cr in normal range and within 360 days    Creatinine, Ser  Date Value Ref Range Status  04/03/2020 1.00 0.44 - 1.00 mg/dL Final         Passed - Valid encounter within last 6 months    Recent Outpatient Visits          3 days ago Type II diabetes mellitus with complication [facility])   [facility] Glean Hess, MD   1 year ago Annual physical exam   [facility] Glean Hess, MD   1 year ago Hyperlipidemia associated with type 2 diabetes mellitus [facility])   [place], Laura H, MD   2 years ago Essential hypertension   [facility] Glean Hess, MD   2 years ago Diabetes mellitus type 2, uncontrolled, without complications [facility])   [facility] Adline Potter, MD      Future Appointments            In 2 weeks Army Melia Jesse Sans, MD [facility]

## 2020-04-06 NOTE — Progress Notes (Signed)
Called [place] [facility] and left a VM for them to call our office back and let us know if patient did complete XR or not since there is no results pending in system.

## 2020-04-06 NOTE — Telephone Encounter (Signed)
Tried calling patient about labs. VM "is not yet activated." Unable to leave a message. Per Dr. Judithann Graves patient needs to call and schedule an eye doctor appt on her own at facility of her choice since she cannot go to referred center.

## 2020-04-07 ENCOUNTER — Ambulatory Visit: Payer: Medicare HMO | Admitting: Oncology

## 2020-04-07 NOTE — Progress Notes (Signed)
Spoke with [employer] and they said patient did not come to get the Xray per their records. Called patients husband and he insists she got the Xray and labs the same day. Unsure of what is going on but there is no record of Xray in system.

## 2020-04-13 ENCOUNTER — Ambulatory Visit: Payer: Self-pay | Admitting: Internal Medicine

## 2020-04-16 ENCOUNTER — Ambulatory Visit: Payer: Self-pay | Admitting: Internal Medicine

## 2020-04-21 ENCOUNTER — Ambulatory Visit: Payer: Medicare HMO | Admitting: Internal Medicine

## 2020-04-24 ENCOUNTER — Ambulatory Visit: Payer: Medicare HMO | Admitting: Internal Medicine

## 2020-04-27 ENCOUNTER — Encounter: Payer: Self-pay | Admitting: Internal Medicine

## 2020-04-27 ENCOUNTER — Ambulatory Visit (INDEPENDENT_AMBULATORY_CARE_PROVIDER_SITE_OTHER): Payer: Medicare HMO | Admitting: Internal Medicine

## 2020-04-27 ENCOUNTER — Other Ambulatory Visit: Payer: Self-pay

## 2020-04-27 VITALS — BP 124/82 | HR 74 | Temp 98.4°F | Ht 69.0 in | Wt 197.0 lb

## 2020-04-27 DIAGNOSIS — L03116 Cellulitis of left lower limb: Secondary | ICD-10-CM | POA: Diagnosis not present

## 2020-04-27 DIAGNOSIS — E1151 Type 2 diabetes mellitus with diabetic peripheral angiopathy without gangrene: Secondary | ICD-10-CM

## 2020-04-27 NOTE — Progress Notes (Signed)
Date:  04/27/2020   Name:  [name]   DOB:  [birth year]   MRN:  [id number]   Chief Complaint: Wound Check (X- ray)  HPI Wound check - she was seen three weeks ago with cellulitis of the left foot.  WBC was normal but xrays were not done - pt was confused about the directions.  She completed the antibiotics and has been using bactroban topically.  She says that there is no drainage or pain.  Lab Results  Component Value Date   CREATININE 1.00 04/03/2020   BUN 16 04/03/2020   NA 138 04/03/2020   K 4.1 04/03/2020   CL 101 04/03/2020   CO2 26 04/03/2020   Lab Results  Component Value Date   CHOL 199 04/03/2020   HDL 56 04/03/2020   LDLCALC 120 (H) 04/03/2020   TRIG 117 04/03/2020   CHOLHDL 3.6 04/03/2020   Lab Results  Component Value Date   TSH 1.70 09/05/2019   Lab Results  Component Value Date   HGBA1C 6.5 (H) 04/03/2020   Lab Results  Component Value Date   WBC 5.3 04/03/2020   HGB 13.2 04/03/2020   HCT 40.5 04/03/2020   MCV 87.5 04/03/2020   PLT 235 04/03/2020   Lab Results  Component Value Date   ALT 18 04/03/2020   AST 19 04/03/2020   ALKPHOS 68 04/03/2020   BILITOT 0.5 04/03/2020     Review of Systems  Constitutional: Negative for chills, fatigue, fever and unexpected weight change.  Respiratory: Negative for shortness of breath.   Cardiovascular: Negative for chest pain.  Skin: Positive for color change, rash and wound.  Neurological: Negative for dizziness and headaches.  Psychiatric/Behavioral: Positive for confusion. The patient is not nervous/anxious.     Patient Active Problem List   Diagnosis Date Noted  . Annual physical exam 09/17/2019  . Pulmonary nodule 07/23/2019  . Syncope and collapse 04/08/2019  . Elevated troponin   . Cerebrovascular accident (CVA) ([place]) 12/31/2018  . Seizure disorder as sequela of cerebrovascular accident ([place]) 12/31/2018  . Peripheral vascular disease with stasis dermatitis 04/27/2018  . Detrusor  instability 05/01/2017  . Essential hypertension 08/18/2016  . Microalbuminuria 05/24/2016  . Type II diabetes mellitus with peripheral circulatory disorder ([place]) 05/23/2016  . Hyperlipidemia associated with type 2 diabetes mellitus ([place]) 05/23/2016  . B12 deficiency 05/23/2016  . Short-term memory loss 05/23/2016  . Overweight (BMI 25.0-29.9) 05/23/2016  . Edema of right lower extremity 05/23/2016  . ICH (intracerebral hemorrhage) ([place]) 02/07/2013    No Known Allergies  History reviewed. No pertinent surgical history.  Social History   Tobacco Use  . Smoking status: Former Smoker    Packs/day: 0.25    Years: 50.00    Pack years: 12.50    Types: Cigarettes    Quit date: 2009    Years since quitting: 13.2  . Smokeless tobacco: Never Used  . Tobacco comment: smoking cessation materials not required  Vaping Use  . Vaping Use: Never used  Substance Use Topics  . Alcohol use: Yes    Comment: every now and then  . Drug use: Never     Medication list has been reviewed and updated.  Current Meds  Medication Sig  . Blood Glucose Monitoring Suppl (ACCU-CHEK GUIDE ME) w/Device KIT USE UP TO FOUR TIMES DAILY AS DIRECTED  . glucose blood test strip Use per insurance brand. Diagnosis:Diabetes Mellitus 2 Uncontrolled without Complications = H70. 65. Check Blood Sugar up to four times  daily.  . Lancets MISC Use per insurance brand. To use with Glucometer to monitor sugar up to four times daily.  Marland Kitchen levETIRAcetam (KEPPRA) 500 MG tablet Take 1 tablet (500 mg total) by mouth 2 (two) times daily.  Marland Kitchen lisinopril-hydrochlorothiazide (ZESTORETIC) 10-12.5 MG tablet Take 1 tablet by mouth once daily  . mupirocin ointment (BACTROBAN) 2 % Apply 1 application topically 2 (two) times daily. To foot wound  . pravastatin (PRAVACHOL) 40 MG tablet Take 40 mg by mouth daily.  . sitaGLIPtin-metformin (JANUMET) 50-500 MG tablet Take 1 tablet by mouth 2 (two) times daily with a meal.  . Vitamin D,  Cholecalciferol, 25 MCG (1000 UT) CAPS Take 1 capsule by mouth daily.    PHQ 2/9 Scores 04/27/2020 04/03/2020 07/22/2019 08/17/2018  PHQ - 2 Score 0 0 0 0  PHQ- 9 Score 0 0 - 0    GAD 7 : Generalized Anxiety Score 04/27/2020 04/03/2020  Nervous, Anxious, on Edge 0 0  Control/stop worrying 0 0  Worry too much - different things 0 0  Trouble relaxing 0 0  Restless 0 0  Easily annoyed or irritable 0 0  Afraid - awful might happen 0 0  Total GAD 7 Score 0 0    BP Readings from Last 3 Encounters:  04/27/20 124/82  04/03/20 130/80  07/22/19 120/70    Physical Exam Vitals and nursing note reviewed.  Constitutional:      General: She is not in acute distress.    Appearance: Normal appearance. She is well-developed.  HENT:     Head: Normocephalic and atraumatic.  Cardiovascular:     Rate and Rhythm: Normal rate and regular rhythm.     Pulses:          Dorsalis pedis pulses are 0 on the right side and 0 on the left side.       Posterior tibial pulses are 0 on the right side and 0 on the left side.     Comments: Unable to detect pulses in either foot Distal skin shiny atrophic Pulmonary:     Effort: Pulmonary effort is normal. No respiratory distress.     Breath sounds: No wheezing or rhonchi.  Musculoskeletal:     Cervical back: Normal range of motion.  Lymphadenopathy:     Cervical: No cervical adenopathy.  Skin:    General: Skin is warm and dry.     Findings: No rash.     Comments: Medial left foot - closed wound, thickened skin with scaling over heel/lateral/medial foot Similar skin changes on right Thickened nails  Neurological:     Mental Status: She is alert.  Psychiatric:        Mood and Affect: Mood normal.        Behavior: Behavior normal.     Wt Readings from Last 3 Encounters:  04/27/20 197 lb (89.4 kg)  04/03/20 195 lb (88.5 kg)  07/22/19 200 lb 12.8 oz (91.1 kg)    BP 124/82   Pulse 74   Temp 98.4 F (36.9 C) (Oral)   Ht '5\' 9"'  (1.753 m)   Wt 197 lb  (89.4 kg)   SpO2 97%   BMI 29.09 kg/m   Assessment and Plan: 1. Cellulitis of left lower extremity Antibiotics completed; lesion closed without evidence of fluctuance - difficult to access skin color due to excessive hypertrophy/callus formation Continue local care - Ambulatory referral to Podiatry  2. Type II diabetes mellitus with peripheral circulatory disorder (HCC) Clinically stable by exam and  report without s/s of hypoglycemia. DM complicated by PVD. Tolerating medications  well without side effects or other concerns. - Ambulatory referral to Podiatry   Partially dictated using [employer]. Any errors are unintentional.  Halina Maidens, MD [facility]  04/27/2020

## 2020-06-10 ENCOUNTER — Telehealth: Payer: Self-pay

## 2020-06-10 NOTE — Telephone Encounter (Signed)
Copied from CRM [id number]. Topic: Referral - Request for Referral >> Jun 10, 2020 11:26 AM Marylen Ponto wrote: Has patient seen PCP for this complaint? yes  *If NO, is insurance requiring patient see PCP for this issue before PCP can refer them? Referral for which specialty: Ophthalmology  Preferred provider/office: no specific provider Reason for referral: eye exam

## 2020-06-10 NOTE — Telephone Encounter (Signed)
Tried contacting patient but it says I could not leave a message

## 2020-07-03 ENCOUNTER — Other Ambulatory Visit: Payer: Self-pay

## 2020-07-03 ENCOUNTER — Telehealth: Payer: Self-pay

## 2020-07-03 DIAGNOSIS — R569 Unspecified convulsions: Secondary | ICD-10-CM

## 2020-07-03 MED ORDER — LEVETIRACETAM 1000 MG PO TABS: 1000.0000 mg | ORAL_TABLET | Freq: Two times a day (BID) | ORAL | 0 refills | Status: DC

## 2020-07-03 NOTE — Telephone Encounter (Signed)
Copied from CRM [id number]. Topic: General - Other >> Jul 03, 2020 11:52 AM Gaetana Michaelis A wrote: Reason for CRM: Fleet Contras with [facility] neurology has questions related to changes in patient's seizure medications  Fleet Contras would like to be contacted by patient's PCP or a member of clinical staff when available   Please contact to further advise

## 2020-07-03 NOTE — Telephone Encounter (Signed)
Spoke with Fleet Contras - explained that patient in February told Dr Judithann Graves she could not get a refill from her previous Neurologist at [facility]. Fleet Contras said she is currently intubated at the [facility] and they are trying to get her back on track with her seizures. Asked why Dr Judithann Graves filled keppra 500 mg BID. Explained she just said she needed a refill and this is what Dr Judithann Graves filled because this is what patient reported she was taking.  Changing to Keppra 1000 mg BID and changed this does in her records. Patient said husband is planning to switch her care to [facility] Neurology now and they will continue to fill patients medications and keep this under control.

## 2020-07-22 ENCOUNTER — Ambulatory Visit (INDEPENDENT_AMBULATORY_CARE_PROVIDER_SITE_OTHER): Payer: Medicare Other

## 2020-07-22 DIAGNOSIS — Z0000 Encounter for general adult medical examination without abnormal findings: Secondary | ICD-10-CM

## 2020-07-22 NOTE — Patient Instructions (Signed)
Ms. Kever , Thank you for taking time to come for your Medicare Wellness Visit. I appreciate your ongoing commitment to your health goals. Please review the following plan we discussed and let me know if I can assist you in the future.   Screening recommendations/referrals: Colonoscopy: no longer required Mammogram: no longer required Bone Density: no longer required Recommended yearly ophthalmology/optometry visit for glaucoma screening and checkup Recommended yearly dental visit for hygiene and checkup  Vaccinations: Influenza vaccine: declined Pneumococcal vaccine: declined Tdap vaccine: due Shingles vaccine: Shingrix discussed. Please contact your pharmacy for coverage information.  Covid-19: done 04/07/19, 05/05/19 & 01/23/20  Advanced directives: Advance directive discussed with you today. I have provided a copy for you to complete at home and have notarized. Once this is complete please bring a copy in to our office so we can scan it into your chart.   Conditions/risks identified: Recommend increasing physical activity as tolerated  Next appointment: Follow up in one year for your annual wellness visit    Preventive Care 65 Years and Older, Female Preventive care refers to lifestyle choices and visits with your health care provider that can promote health and wellness. What does preventive care include? A yearly physical exam. This is also called an annual well check. Dental exams once or twice a year. Routine eye exams. Ask your health care provider how often you should have your eyes checked. Personal lifestyle choices, including: Daily care of your teeth and gums. Regular physical activity. Eating a healthy diet. Avoiding tobacco and drug use. Limiting alcohol use. Practicing safe sex. Taking low-dose aspirin every day. Taking vitamin and mineral supplements as recommended by your health care provider. What happens during an annual well check? The services and  screenings done by your health care provider during your annual well check will depend on your age, overall health, lifestyle risk factors, and family history of disease. Counseling  Your health care provider may ask you questions about your: Alcohol use. Tobacco use. Drug use. Emotional well-being. Home and relationship well-being. Sexual activity. Eating habits. History of falls. Memory and ability to understand (cognition). Work and work Astronomer. Reproductive health. Screening  You may have the following tests or measurements: Height, weight, and BMI. Blood pressure. Lipid and cholesterol levels. These may be checked every 5 years, or more frequently if you are over 52 years old. Skin check. Lung cancer screening. You may have this screening every year starting at age 35 if you have a 30-pack-year history of smoking and currently smoke or have quit within the past 15 years. Fecal occult blood test (FOBT) of the stool. You may have this test every year starting at age 85. Flexible sigmoidoscopy or colonoscopy. You may have a sigmoidoscopy every 5 years or a colonoscopy every 10 years starting at age 75. Hepatitis C blood test. Hepatitis B blood test. Sexually transmitted disease (STD) testing. Diabetes screening. This is done by checking your blood sugar (glucose) after you have not eaten for a while (fasting). You may have this done every 1-3 years. Bone density scan. This is done to screen for osteoporosis. You may have this done starting at age 4. Mammogram. This may be done every 1-2 years. Talk to your health care provider about how often you should have regular mammograms. Talk with your health care provider about your test results, treatment options, and if necessary, the need for more tests. Vaccines  Your health care provider may recommend certain vaccines, such as: Influenza vaccine. This is recommended  every year. Tetanus, diphtheria, and acellular pertussis (Tdap,  Td) vaccine. You may need a Td booster every 10 years. Zoster vaccine. You may need this after age 57. Pneumococcal 13-valent conjugate (PCV13) vaccine. One dose is recommended after age 81. Pneumococcal polysaccharide (PPSV23) vaccine. One dose is recommended after age 82. Talk to your health care provider about which screenings and vaccines you need and how often you need them. This information is not intended to replace advice given to you by your health care provider. Make sure you discuss any questions you have with your health care provider. Document Released: 02/20/2015 Document Revised: 10/14/2015 Document Reviewed: 11/25/2014 Elsevier Interactive Patient Education  2017 [place] Prevention in the Home Falls can cause injuries. They can happen to people of all ages. There are many things you can do to make your home safe and to help prevent falls. What can I do on the outside of my home? Regularly fix the edges of walkways and driveways and fix any cracks. Remove anything that might make you trip as you walk through a door, such as a raised step or threshold. Trim any bushes or trees on the path to your home. Use bright outdoor lighting. Clear any walking paths of anything that might make someone trip, such as rocks or tools. Regularly check to see if handrails are loose or broken. Make sure that both sides of any steps have handrails. Any raised decks and porches should have guardrails on the edges. Have any leaves, snow, or ice cleared regularly. Use sand or salt on walking paths during winter. Clean up any spills in your garage right away. This includes oil or grease spills. What can I do in the bathroom? Use night lights. Install grab bars by the toilet and in the tub and shower. Do not use towel bars as grab bars. Use non-skid mats or decals in the tub or shower. If you need to sit down in the shower, use a plastic, non-slip stool. Keep the floor dry. Clean up any  water that spills on the floor as soon as it happens. Remove soap buildup in the tub or shower regularly. Attach bath mats securely with double-sided non-slip rug tape. Do not have throw rugs and other things on the floor that can make you trip. What can I do in the bedroom? Use night lights. Make sure that you have a light by your bed that is easy to reach. Do not use any sheets or blankets that are too big for your bed. They should not hang down onto the floor. Have a firm chair that has side arms. You can use this for support while you get dressed. Do not have throw rugs and other things on the floor that can make you trip. What can I do in the kitchen? Clean up any spills right away. Avoid walking on wet floors. Keep items that you use a lot in easy-to-reach places. If you need to reach something above you, use a strong step stool that has a grab bar. Keep electrical cords out of the way. Do not use floor polish or wax that makes floors slippery. If you must use wax, use non-skid floor wax. Do not have throw rugs and other things on the floor that can make you trip. What can I do with my stairs? Do not leave any items on the stairs. Make sure that there are handrails on both sides of the stairs and use them. Fix handrails that are broken  or loose. Make sure that handrails are as long as the stairways. Check any carpeting to make sure that it is firmly attached to the stairs. Fix any carpet that is loose or worn. Avoid having throw rugs at the top or bottom of the stairs. If you do have throw rugs, attach them to the floor with carpet tape. Make sure that you have a light switch at the top of the stairs and the bottom of the stairs. If you do not have them, ask someone to add them for you. What else can I do to help prevent falls? Wear shoes that: Do not have high heels. Have rubber bottoms. Are comfortable and fit you well. Are closed at the toe. Do not wear sandals. If you use a  stepladder: Make sure that it is fully opened. Do not climb a closed stepladder. Make sure that both sides of the stepladder are locked into place. Ask someone to hold it for you, if possible. Clearly mark and make sure that you can see: Any grab bars or handrails. First and last steps. Where the edge of each step is. Use tools that help you move around (mobility aids) if they are needed. These include: Canes. Walkers. Scooters. Crutches. Turn on the lights when you go into a dark area. Replace any light bulbs as soon as they burn out. Set up your furniture so you have a clear path. Avoid moving your furniture around. If any of your floors are uneven, fix them. If there are any pets around you, be aware of where they are. Review your medicines with your doctor. Some medicines can make you feel dizzy. This can increase your chance of falling. Ask your doctor what other things that you can do to help prevent falls. This information is not intended to replace advice given to you by your health care provider. Make sure you discuss any questions you have with your health care provider. Document Released: 11/20/2008 Document Revised: 07/02/2015 Document Reviewed: 02/28/2014 Elsevier Interactive Patient Education  2017 [employer].

## 2020-07-22 NOTE — Progress Notes (Signed)
Subjective:   [name] is a 80 y.o. female who presents for Medicare Annual (Subsequent) preventive examination.  Virtual Visit via Telephone Note  I connected with  [name] on 07/22/20 at 10:00 AM EDT by telephone and verified that I am speaking with the correct person using two identifiers.  Location: Patient: home Provider: [facility] Persons participating in the virtual visit: patient & husband [place]   I discussed the limitations, risks, security and privacy concerns of performing an evaluation and management service by telephone and the availability of in person appointments. The patient expressed understanding and agreed to proceed.  Interactive audio and video telecommunications were attempted between this nurse and patient, however failed, due to patient having technical difficulties OR patient did not have access to video capability.  We continued and completed visit with audio only.  Some vital signs may be absent or patient reported.   Clemetine Marker, LPN   Review of Systems     Cardiac Risk Factors include: advanced age (>13mn, >>51women);diabetes mellitus;dyslipidemia;hypertension;sedentary lifestyle     Objective:    There were no vitals filed for this visit. There is no height or weight on file to calculate BMI.  Advanced Directives 07/22/2020 04/01/2019 04/01/2019 07/16/2018 07/12/2017 08/18/2016 06/17/2016  Does Patient Have a Medical Advance Directive? No No Unable to assess, patient is non-responsive or altered mental status No No No No  Would patient like information on creating a medical advance directive? Yes (MAU/Ambulatory/Procedural Areas - Information given) No - Patient declined - No - Patient declined Yes (MAU/Ambulatory/Procedural Areas - Information given) - -    Current Medications (verified) Outpatient Encounter Medications as of 07/22/2020  Medication Sig   Blood Glucose Monitoring Suppl (ACCU-CHEK GUIDE ME) w/Device  KIT USE UP TO FOUR TIMES DAILY AS DIRECTED   donepezil (ARICEPT) 5 MG tablet Take 5 mg by mouth daily.   glucose blood test strip Use per insurance brand. Diagnosis:Diabetes Mellitus 2 Uncontrolled without Complications = ET70 65. Check Blood Sugar up to four times daily.   Lancets MISC Use per insurance brand. To use with Glucometer to monitor sugar up to four times daily.   levETIRAcetam (KEPPRA) 1000 MG tablet Take 1 tablet (1,000 mg total) by mouth 2 (two) times daily.   lisinopril-hydrochlorothiazide (ZESTORETIC) 10-12.5 MG tablet Take 1 tablet by mouth once daily   pravastatin (PRAVACHOL) 40 MG tablet Take 40 mg by mouth daily.   sitaGLIPtin-metformin (JANUMET) 50-500 MG tablet Take 1 tablet by mouth 2 (two) times daily with a meal.   Vitamin D, Cholecalciferol, 25 MCG (1000 UT) CAPS Take 1 capsule by mouth daily.   [DISCONTINUED] mupirocin ointment (BACTROBAN) 2 % Apply 1 application topically 2 (two) times daily. To foot wound   No facility-administered encounter medications on file as of 07/22/2020.    Allergies (verified) Patient has no known allergies.   History: Past Medical History:  Diagnosis Date   Cellulitis of left foot 05/23/2016   Di[place] complication ([place]    Diabetic ulcer of left foot ([place] 05/23/2016   Hyperlipidemia    Hypertension    Peripheral vascular disease ([place]    [place]ure disorder ([place]    Vitamin D deficiency 05/23/2016   History reviewed. No pertinent surgical history. Family History  Problem Relation Age of Onset   Healthy Mother    Healthy Father    Social History   Socioeconomic History   Marital status: Married    Spouse name: AArnell Sieving  Number  of children: 2   Years of education: Not on file   Highest education level: 12th grade  Occupational History   Occupation: Retired  Tobacco Use   Smoking status: Former    Packs/day: 0.25    Years: 50.00    Pack years: 12.50    Types: Cigarettes    Quit date: 2009    Years  since quitting: 13.4   Smokeless tobacco: Never   Tobacco comments:    smoking cessation materials not required  Vaping Use   Vaping Use: Never used  Substance and Sexual Activity   Alcohol use: Yes    Comment: every now and then   Drug use: Never   Sexual activity: Not Currently  Other Topics Concern   Not on file  Social History Narrative   Not on file   Social Determinants of Health   Financial Resource Strain: Low Risk    Difficulty of Paying Living Expenses: Not hard at all  Food Insecurity: No Food Insecurity   Worried About Charity fundraiser in the Last Year: Never true   [place] in the Last Year: Never true  Transportation Needs: No Transportation Needs   Lack of Transportation (Medical): No   Lack of Transportation (Non-Medical): No  Physical Activity: Inactive   Days of Exercise per Week: 0 days   Minutes of Exercise per Session: 0 min  Stress: No Stress Concern Present   Feeling of Stress : Not at all  Social Connections: Moderately Integrated   Frequency of Communication with Friends and Family: More than three times a week   Frequency of Social Gatherings with Friends and Family: Three times a week   Attends Religious Services: More than 4 times per year   Active Member of Clubs or Organizations: No   Attends Archivist Meetings: Never   Marital Status: Married    Tobacco Counseling Counseling given: No Tobacco comments: smoking cessation materials not required   Clinical Intake:  Pre-visit preparation completed: Yes  Pain : No/denies pain     Nutritional Risks: None Diabetes: Yes CBG done?: No Did pt. bring in CBG monitor from home?: No  How often do you need to have someone help you when you read instructions, pamphlets, or other written materials from your doctor or pharmacy?: 1 - Never  Nutrition Risk Assessment:  Has the patient had any N/V/D within the last 2 months?  No  Does the patient have any non-healing  wounds?  No  Has the patient had any unintentional weight loss or weight gain?  No   Diabetes:  Is the patient diabetic?  Yes  If diabetic, was a CBG obtained today?  No  Did the patient bring in their glucometer from home?  No  How often do you monitor your CBG's? Daily fasting in AM.   Financial Strains and Diabetes Management:  Are you having any financial strains with the device, your supplies or your medication? No .  Does the patient want to be seen by Chronic Care Management for management of their diabetes?  No  Would the patient like to be referred to a Nutritionist or for Diabetic Management?  No   Diabetic Exams:  Diabetic Eye Exam: Completed per patient; need [facility].   Diabetic Foot Exam: Completed 04/03/20. Referred to podiatry 04/27/2020. No encounter seen.   Interpreter Needed?: No  Information entered by :: Clemetine Marker LPN   Activities of Daily Living In your present state  of health, do you have any difficulty performing the following activities: 07/22/2020  Hearing? N  Comment declines hearing aid  Vision? N  Difficulty concentrating or making decisions? Y  Walking or climbing stairs? N  Dressing or bathing? N  Doing errands, shopping? Y  Comment pt does not drive  Preparing Food and eating ? N  Using the Toilet? N  In the past six months, have you accidently leaked urine? N  Do you have problems with loss of bowel control? N  Managing your Medications? N  Managing your Finances? N  Housekeeping or managing your Housekeeping? N  Some recent data might be hidden    Patient Care Team: Glean Hess, MD as PCP - General (Internal Medicine) End, Harrell Gave, MD as PCP - Cardiology (Cardiology) Jannifer Franklin, NP as Nurse Practitioner (Neurology)  Indicate any recent Medical Services you may have received from other than [facility] providers in the past year (date may be approximate).     Assessment:   This is a routine wellness  examination for [name].  Hearing/Vision screen Hearing Screening - Comments:: Pt denies hearing difficulty Vision Screening - Comments:: Annual vision screenings done at [facility] in [place] issues and exercise activities discussed: Current Exercise Habits: The patient does not participate in regular exercise at present, Exercise limited by: None identified   Goals Addressed             This Visit's Progress    DIET - INCREASE WATER INTAKE   On track    Recommend to drink at least 6-8 8oz glasses of water per day.      Patient Stated   On track    Patient states she would like to maintain A1c and keep diabetes well controlled        Depression Screen PHQ 2/9 Scores 07/22/2020 04/27/2020 04/03/2020 07/22/2019 08/17/2018 07/16/2018 04/27/2018  PHQ - 2 Score 0 0 0 0 0 0 0  PHQ- 9 Score - 0 0 - 0 - -    Fall Risk Fall Risk  07/22/2020 04/27/2020 04/03/2020 07/22/2019 08/17/2018  Falls in the past year? 1 0 0 0 0  Number falls in past yr: 0 - - 0 0  Injury with Fall? 0 - - 0 0  Risk for fall due to : No Fall Risks - - No Fall Risks Impaired balance/gait;History of fall(s)  Risk for fall due to: Comment - - - - -  Follow up Falls prevention discussed Falls evaluation completed Falls evaluation completed Falls prevention discussed Falls evaluation completed    [place]:  Any stairs in or around the home? No  If so, are there any without handrails? No  Home free of loose throw rugs in walkways, pet beds, electrical cords, etc? Yes  Adequate lighting in your home to reduce risk of falls? Yes   ASSISTIVE DEVICES UTILIZED TO PREVENT FALLS:  Life alert? No  Use of a cane, walker or w/c? No  Grab bars in the bathroom? Yes  Shower chair or bench in shower? Yes  Elevated toilet seat or a handicapped toilet? No   TIMED UP AND GO:  Was the test performed? No . Telephonic visit.   Cognitive Function: Cognitive status assessed by direct  observation. Patient has current diagnosis of cognitive impairment. Patient is followed by neurology for ongoing assessment.      6CIT Screen 07/22/2019 07/16/2018 07/12/2017  What Year? 0 points 0 points 0 points  What  month? 0 points 3 points 3 points  What time? 0 points 0 points 0 points  Count back from 20 0 points 0 points 0 points  Months in reverse 4 points 0 points 2 points  Repeat phrase 8 points 4 points 10 points  Total Score _0 Immunizations Immunization History  Administered Date(s) Administered   Moderna Sars-Covid-2 Vaccination 04/07/2019, 05/05/2019, 01/23/2020    TDAP status: Due, Education has been provided regarding the importance of this vaccine. Advised may receive this vaccine at local pharmacy or Health Dept. Aware to provide a copy of the vaccination record if obtained from local pharmacy or Health Dept. Verbalized acceptance and understanding.  Flu Vaccine status: Declined, Education has been provided regarding the importance of this vaccine but patient still declined. Advised may receive this vaccine at local pharmacy or Health Dept. Aware to provide a copy of the vaccination record if obtained from local pharmacy or Health Dept. Verbalized acceptance and understanding.  Pneumococcal vaccine status: Declined,  Education has been provided regarding the importance of this vaccine but patient still declined. Advised may receive this vaccine at local pharmacy or Health Dept. Aware to provide a copy of the vaccination record if obtained from local pharmacy or Health Dept. Verbalized acceptance and understanding.   Covid-19 vaccine status: Completed vaccines  Qualifies for Shingles Vaccine? Yes   Zostavax completed No   Shingrix Completed?: No.    Education has been provided regarding the importance of this vaccine. Patient has been advised to call insurance company to determine out of pocket expense if they have not yet received this vaccine. Advised may also  receive vaccine at local pharmacy or Health Dept. Verbalized acceptance and understanding.  Screening Tests Health Maintenance  Topic Date Due   OPHTHALMOLOGY EXAM  04/07/2017   COVID-19 Vaccine (4 - Booster for Moderna series) 08/07/2020 (Originally 04/22/2020)   DEXA SCAN  09/15/2020 (Originally 10/30/2005)   Zoster Vaccines- Shingrix (1 of 2) 10/22/2020 (Originally 10/31/1959)   TETANUS/TDAP  04/03/2021 (Originally 10/31/1959)   Hepatitis C Screening  04/03/2021 (Originally 10/31/1958)   PNA vac Low Risk Adult (1 of 2 - PCV13) 04/03/2021 (Originally 10/30/2005)   INFLUENZA VACCINE  09/07/2020   HEMOGLOBIN A1C  10/01/2020   FOOT EXAM  04/03/2021   HPV VACCINES  Aged Out    Health Maintenance  Health Maintenance Due  Topic Date Due   OPHTHALMOLOGY EXAM  04/07/2017    Colorectal cancer screening: No longer required.   Mammogram status: No longer required due to age.  Bone density screening: no longer required due to age  Lung Cancer Screening: (Low Dose CT Chest recommended if Age 16-80 years, 30 pack-year currently smoking OR have quit w/in 15years.) does not qualify.   Additional Screening:  Hepatitis C Screening: does qualify; postponed  Vision Screening: Recommended annual ophthalmology exams for early detection of glaucoma and other disorders of the eye. Is the patient up to date with their annual eye exam?  Yes  Who is the provider or what is the name of the office in which the patient attends annual eye exams? [facility] [place].   Dental Screening: Recommended annual dental exams for proper oral hygiene  Community Resource Referral / Chronic Care Management: CRR required this visit?  No   CCM required this visit?  No      Plan:     I have personally reviewed and noted the following in the patient's chart:   Medical and social history Use of  alcohol, tobacco or illicit drugs  Current medications and supplements including opioid prescriptions.  Functional  ability and status Nutritional status Physical activity Advanced directives List of other physicians Hospitalizations, surgeries, and ER visits in previous 12 months Vitals Screenings to include cognitive, depression, and falls Referrals and appointments  In addition, I have reviewed and discussed with patient certain preventive protocols, quality metrics, and best practice recommendations. A written personalized care plan for preventive services as well as general preventive health recommendations were provided to patient.     Clemetine Marker, LPN   [id number]/81/[id number]   Nurse Notes: pt recently hospitalized due to seizure activity. Pt's husband Arnell Sieving states she does not have an appointment with [facility] at this time; offered to provide him with the phone # to contact them and he stated he would wait for them to call. Also offered CCM care manager for follow up outreach call and care coordination and he declined. Patient was in good spirits with no complaints other than she said she was doing stretching exercises in bed this morning and fell out of bed but no injury.

## 2020-11-18 ENCOUNTER — Encounter: Payer: Self-pay | Admitting: *Deleted

## 2020-11-18 NOTE — Progress Notes (Signed)
Reminder letter sent to patient requesting to call back to schedule follow up appts in the lung nodule clinic. Awaiting call back. 

## 2020-12-01 ENCOUNTER — Other Ambulatory Visit: Payer: Self-pay | Admitting: Internal Medicine

## 2020-12-01 DIAGNOSIS — E118 Type 2 diabetes mellitus with unspecified complications: Secondary | ICD-10-CM

## 2020-12-01 NOTE — Telephone Encounter (Signed)
Patient will need an office visit for further refills. Requested Prescriptions  Pending Prescriptions Disp Refills  . JANUMET 50-500 MG tablet [Pharmacy Med Name: JANUMET 50/500MG TABLETS] 60 tablet 0    Sig: TAKE 1 TABLET BY MOUTH TWICE DAILY WITH A MEAL     Endocrinology:  Diabetes - Biguanide + DPP-4 Inhibitor Combos Failed - 12/01/2020  1:47 PM      Failed - HBA1C is between 0 and 7.9 and within 180 days    Hemoglobin A1C  Date Value Ref Range Status  09/05/2019 6.4  Final   Hgb A1c MFr Bld  Date Value Ref Range Status  04/03/2020 6.5 (H) 4.8 - 5.6 % Final    Comment:    (NOTE) Pre diabetes:          5.7%-6.4%  Diabetes:              >6.4%  Glycemic control for   <7.0% adults with diabetes          Failed - AA eGFR in normal range and within 360 days    GFR calc Af Amer  Date Value Ref Range Status  04/02/2019 >60 >60 mL/min Final   GFR, Estimated  Date Value Ref Range Status  04/03/2020 57 (L) >60 mL/min Final    Comment:    (NOTE) Calculated using the CKD-EPI Creatinine Equation (2021)          Failed - Valid encounter within last 6 months    Recent Outpatient Visits          7 months ago Cellulitis of left lower extremity   [facility] Glean Hess, MD   8 months ago Type II diabetes mellitus with complication [facility])   [facility] Glean Hess, MD   2 years ago Annual physical exam   [facility] Glean Hess, MD   2 years ago Hyperlipidemia associated with type 2 diabetes mellitus [facility])   [place], Laura H, MD   2 years ago Essential hypertension   [facility] Glean Hess, MD             Passed - Cr in normal range and within 360 days    Creatinine, Ser  Date Value Ref Range Status  04/03/2020 1.00 0.44 - 1.00 mg/dL Final

## 2020-12-19 ENCOUNTER — Other Ambulatory Visit: Payer: Self-pay | Admitting: Internal Medicine

## 2020-12-19 DIAGNOSIS — R569 Unspecified convulsions: Secondary | ICD-10-CM

## 2020-12-19 NOTE — Telephone Encounter (Signed)
Requested medication (s) are due for refill today: Yes  Requested medication (s) are on the active medication list: Yes  Last refill:  07/03/20  Future visit scheduled: No  Notes to clinic:  Unable to refill per protocol, cannot delegate. Pt suppose to follow [facility] Neurology for this med.       Requested Prescriptions  Pending Prescriptions Disp Refills   levETIRAcetam (KEPPRA) 500 MG tablet [Pharmacy Med Name: LEVETIRACETAM 500MG  TABLETS] 60 tablet 5    Sig: TAKE 1 TABLET(500 MG) BY MOUTH TWICE DAILY     Not Delegated - Neurology:  Anticonvulsants Failed - 12/19/2020  9:37 AM      Failed - This refill cannot be delegated      Passed - HCT in normal range and within 360 days    HCT  Date Value Ref Range Status  04/03/2020 40.5 36.0 - 46.0 % Final   Hematocrit  Date Value Ref Range Status  08/17/2018 38.6 34.0 - 46.6 % Final          Passed - HGB in normal range and within 360 days    Hemoglobin  Date Value Ref Range Status  04/03/2020 13.2 12.0 - 15.0 g/dL Final  04/05/2020 16/11/9602 11.1 - 15.9 g/dL Final          Passed - PLT in normal range and within 360 days    Platelets  Date Value Ref Range Status  04/03/2020 235 150 - 400 K/uL Final  08/17/2018 208 150 - 450 x10E3/uL Final          Passed - WBC in normal range and within 360 days    WBC  Date Value Ref Range Status  04/03/2020 5.3 4.0 - 10.5 K/uL Final          Passed - Valid encounter within last 12 months    Recent Outpatient Visits           7 months ago Cellulitis of left lower extremity   [facility], MD   8 months ago Type II diabetes mellitus with complication [facility])   [facility], MD   2 years ago Annual physical exam   [facility], MD   2 years ago Hyperlipidemia associated with type 2 diabetes mellitus [facility])   [facility], MD   2 years ago Essential hypertension   [facility], MD

## 2020-12-20 ENCOUNTER — Other Ambulatory Visit: Payer: Self-pay | Admitting: Internal Medicine

## 2020-12-20 DIAGNOSIS — E118 Type 2 diabetes mellitus with unspecified complications: Secondary | ICD-10-CM

## 2020-12-21 ENCOUNTER — Other Ambulatory Visit: Payer: Self-pay | Admitting: Internal Medicine

## 2020-12-21 DIAGNOSIS — R569 Unspecified convulsions: Secondary | ICD-10-CM

## 2020-12-21 NOTE — Telephone Encounter (Signed)
Appointment tomorrow- last RF 12/01/20 #60- has to keep appointment notes on last RF Requested Prescriptions  Pending Prescriptions Disp Refills  . JANUMET 50-500 MG tablet [Pharmacy Med Name: JANUMET 50/500MG TABLETS] 60 tablet 0    Sig: TAKE 1 TABLET BY MOUTH TWICE DAILY WITH A MEAL     Endocrinology:  Diabetes - Biguanide + DPP-4 Inhibitor Combos Failed - 12/20/2020  1:24 PM      Failed - HBA1C is between 0 and 7.9 and within 180 days    Hemoglobin A1C  Date Value Ref Range Status  09/05/2019 6.4  Final   Hgb A1c MFr Bld  Date Value Ref Range Status  04/03/2020 6.5 (H) 4.8 - 5.6 % Final    Comment:    (NOTE) Pre diabetes:          5.7%-6.4%  Diabetes:              >6.4%  Glycemic control for   <7.0% adults with diabetes          Failed - AA eGFR in normal range and within 360 days    GFR calc Af Amer  Date Value Ref Range Status  04/02/2019 >60 >60 mL/min Final   GFR, Estimated  Date Value Ref Range Status  04/03/2020 57 (L) >60 mL/min Final    Comment:    (NOTE) Calculated using the CKD-EPI Creatinine Equation (2021)          Failed - Valid encounter within last 6 months    Recent Outpatient Visits          7 months ago Cellulitis of left lower extremity   [facility] Glean Hess, MD   8 months ago Type II diabetes mellitus with complication [facility])   [facility] Glean Hess, MD   2 years ago Annual physical exam   [facility] Glean Hess, MD   2 years ago Hyperlipidemia associated with type 2 diabetes mellitus [facility])   [place], Laura H, MD   2 years ago Essential hypertension   [facility] Glean Hess, MD      Future Appointments            Tomorrow Glean Hess, MD [facility]           Passed - Cr in normal range and within 360 days    Creatinine, Ser  Date Value Ref Range Status  04/03/2020 1.00 0.44 - 1.00 mg/dL Final

## 2020-12-22 ENCOUNTER — Ambulatory Visit (INDEPENDENT_AMBULATORY_CARE_PROVIDER_SITE_OTHER): Payer: Medicare Other | Admitting: Internal Medicine

## 2020-12-22 ENCOUNTER — Other Ambulatory Visit: Payer: Self-pay | Admitting: Internal Medicine

## 2020-12-22 ENCOUNTER — Encounter: Payer: Self-pay | Admitting: Internal Medicine

## 2020-12-22 ENCOUNTER — Other Ambulatory Visit: Payer: Self-pay

## 2020-12-22 VITALS — BP 146/96 | HR 76 | Ht 69.0 in | Wt 199.4 lb

## 2020-12-22 DIAGNOSIS — E1151 Type 2 diabetes mellitus with diabetic peripheral angiopathy without gangrene: Secondary | ICD-10-CM | POA: Diagnosis not present

## 2020-12-22 DIAGNOSIS — I10 Essential (primary) hypertension: Secondary | ICD-10-CM

## 2020-12-22 DIAGNOSIS — E785 Hyperlipidemia, unspecified: Secondary | ICD-10-CM

## 2020-12-22 DIAGNOSIS — G40909 Epilepsy, unspecified, not intractable, without status epilepticus: Secondary | ICD-10-CM

## 2020-12-22 DIAGNOSIS — E118 Type 2 diabetes mellitus with unspecified complications: Secondary | ICD-10-CM

## 2020-12-22 DIAGNOSIS — E1169 Type 2 diabetes mellitus with other specified complication: Secondary | ICD-10-CM | POA: Diagnosis not present

## 2020-12-22 DIAGNOSIS — I69398 Other sequelae of cerebral infarction: Secondary | ICD-10-CM | POA: Diagnosis not present

## 2020-12-22 DIAGNOSIS — R413 Other amnesia: Secondary | ICD-10-CM

## 2020-12-22 DIAGNOSIS — I639 Cerebral infarction, unspecified: Secondary | ICD-10-CM

## 2020-12-22 MED ORDER — DONEPEZIL HCL 5 MG PO TABS: 5.0000 mg | ORAL_TABLET | Freq: Every day | ORAL | 1 refills | Status: AC

## 2020-12-22 MED ORDER — LISINOPRIL-HYDROCHLOROTHIAZIDE 10-12.5 MG PO TABS: 1.0000 | ORAL_TABLET | Freq: Every day | ORAL | 1 refills | Status: DC

## 2020-12-22 MED ORDER — JANUMET 50-500 MG PO TABS: 1.0000 | ORAL_TABLET | Freq: Two times a day (BID) | ORAL | 1 refills | Status: DC

## 2020-12-22 MED ORDER — PRAVASTATIN SODIUM 40 MG PO TABS: 40.0000 mg | ORAL_TABLET | Freq: Every day | ORAL | 1 refills | Status: AC

## 2020-12-22 NOTE — Telephone Encounter (Signed)
Duplicate refill request. Denying for that reason.

## 2020-12-22 NOTE — Patient Instructions (Signed)
Normal Blood Pressure should be under 140/90!   How to Take Your Blood Pressure Blood pressure measures how strongly your blood is pressing against the walls of your arteries. Arteries are blood vessels that carry blood from your heart throughout your body. You can take your blood pressure at home with a machine. You may need to check your blood pressure at home: To check if you have high blood pressure (hypertension). To check your blood pressure over time. To make sure your blood pressure medicine is working. Supplies needed: Blood pressure machine, or monitor. Dining room chair to sit in. Table or desk. Small notebook. Pencil or pen. How to prepare Avoid these things for 30 minutes before checking your blood pressure: Having drinks with caffeine in them, such as coffee or tea. Drinking alcohol. Eating. Smoking. Exercising. Do these things five minutes before checking your blood pressure: Go to the bathroom and pee (urinate). Sit in a dining chair. Do not sit on a soft couch or an armchair. Be quiet. Do not talk. How to take your blood pressure Follow the instructions that came with your machine. If you have a digital blood pressure monitor, these may be the instructions: Sit up straight. Place your feet on the floor. Do not cross your ankles or legs. Rest your left arm at the level of your heart. You may rest it on a table, desk, or chair. Pull up your shirt sleeve. Wrap the blood pressure cuff around the upper part of your left arm. The cuff should be 1 inch (2.5 cm) above your elbow. It is best to wrap the cuff around bare skin. Fit the cuff snugly around your arm. You should be able to place only one finger between the cuff and your arm. Place the cord so that it rests in the bend of your elbow. Press the power button. Sit quietly while the cuff fills with air and loses air. Write down the numbers on the screen. Wait 2-3 minutes and then repeat steps 1-10. What do the  numbers mean? Two numbers make up your blood pressure. The first number is called systolic pressure. The second is called diastolic pressure. An example of a blood pressure reading is "120 over 80" (or 120/80). If you are an adult and do not have a medical condition, use this guide to find out if your blood pressure is normal: Normal First number: below 120. Second number: below 80. Elevated First number: 120-129. Second number: below 80. Hypertension stage 1 First number: 130-139. Second number: 80-89. Hypertension stage 2 First number: 140 or above. Second number: 58 or above. Your blood pressure is above normal even if only the first or only the second number is above normal. Follow these instructions at home: Medicines Take over-the-counter and prescription medicines only as told by your doctor. Tell your doctor if your medicine is causing side effects. General instructions Check your blood pressure as often as your doctor tells you to. Check your blood pressure at the same time every day. Take your monitor to your next doctor's appointment. Your doctor will: Make sure you are using it correctly. Make sure it is working right. Understand what your blood pressure numbers should be. Keep all follow-up visits as told by your doctor. This is important. General tips You will need a blood pressure machine, or monitor. Your doctor can suggest a monitor. You can buy one at a drugstore or online. When choosing one: Choose one with an arm cuff. Choose one that wraps around  your upper arm. Only one finger should fit between your arm and the cuff. Do not choose one that measures your blood pressure from your wrist or finger. Where to find more information American Heart Association: www.heart.org Contact a doctor if: Your blood pressure keeps being high. Your blood pressure is suddenly low. Get help right away if: Your first blood pressure number is higher than 180. Your second blood  pressure number is higher than 120. Summary Check your blood pressure at the same time every day. Avoid caffeine, alcohol, smoking, and exercise for 30 minutes before checking your blood pressure. Make sure you understand what your blood pressure numbers should be. This information is not intended to replace advice given to you by your health care provider. Make sure you discuss any questions you have with your health care provider. Document Revised: 12/04/2019 Document Reviewed: 01/18/2019 Elsevier Patient Education  2022 [employer].

## 2020-12-22 NOTE — Progress Notes (Signed)
Date:  12/22/2020   Name:  [name]   DOB:  Oct 13, 1940   MRN:  [id number]  She presents today with her husband.  He states that she has thrown away most of her medication because he was not paying close attention.   Chief Complaint: Diabetes and Hypertension  Diabetes She presents for her follow-up diabetic visit. She has type 2 diabetes mellitus. Her disease course has been stable. Pertinent negatives for hypoglycemia include no headaches or tremors. Pertinent negatives for diabetes include no chest pain, no fatigue, no polydipsia and no polyuria. Diabetic complications include a CVA. Current diabetic treatments: janumet. An ACE inhibitor/angiotensin II receptor blocker is being taken. Eye exam is not current.  Hypertension This is a chronic problem. The problem is controlled. Pertinent negatives include no chest pain, headaches, palpitations or shortness of breath. Past treatments include ACE inhibitors and diuretics. The current treatment provides significant improvement. There are no compliance problems.  Hypertensive end-organ damage includes CVA. There is no history of kidney disease or CAD/MI.  Hyperlipidemia This is a chronic problem. The problem is resistant. Pertinent negatives include no chest pain or shortness of breath. Current antihyperlipidemic treatment includes statins.  Seizure disorder - onset in 2020 and started on Keppra.  Hospitalized in June with seizures felt to be due to inadequate keppra dosing.  She was referred to [facility] neurology but has not been seen.  She was seen in the past by [facility].  She denies any recent seizures despite being out of keppra for an unknown length of time.  Lab Results  Component Value Date   CREATININE 1.00 04/03/2020   BUN 16 04/03/2020   NA 138 04/03/2020   K 4.1 04/03/2020   CL 101 04/03/2020   CO2 26 04/03/2020   Lab Results  Component Value Date   CHOL 199 04/03/2020   HDL 56 04/03/2020   LDLCALC 120 (H) 04/03/2020    TRIG 117 04/03/2020   CHOLHDL 3.6 04/03/2020   Lab Results  Component Value Date   TSH 1.70 09/05/2019   Lab Results  Component Value Date   HGBA1C 6.5 (H) 04/03/2020   Lab Results  Component Value Date   WBC 5.3 04/03/2020   HGB 13.2 04/03/2020   HCT 40.5 04/03/2020   MCV 87.5 04/03/2020   PLT 235 04/03/2020   Lab Results  Component Value Date   ALT 18 04/03/2020   AST 19 04/03/2020   ALKPHOS 68 04/03/2020   BILITOT 0.5 04/03/2020     Review of Systems  Constitutional:  Negative for appetite change, fatigue, fever and unexpected weight change.  HENT:  Negative for tinnitus and trouble swallowing.   Eyes:  Negative for visual disturbance.  Respiratory:  Negative for cough, chest tightness and shortness of breath.   Cardiovascular:  Negative for chest pain, palpitations and leg swelling.  Gastrointestinal:  Negative for abdominal pain.  Endocrine: Negative for polydipsia and polyuria.  Genitourinary:  Negative for dysuria and hematuria.  Musculoskeletal:  Negative for arthralgias.  Neurological:  Negative for tremors, numbness and headaches.  Psychiatric/Behavioral:  Negative for dysphoric mood.    Patient Active Problem List   Diagnosis Date Noted   Annual physical exam 09/17/2019   Pulmonary nodule 07/23/2019   Syncope and collapse 04/08/2019   Elevated troponin    Cerebrovascular accident (CVA) ([place]) 12/31/2018   Seizure disorder as sequela of cerebrovascular accident ([place]) 12/31/2018   Peripheral vascular disease with stasis dermatitis 04/27/2018   Detrusor instability 05/01/2017  Essential hypertension 08/18/2016   Microalbuminuria 05/24/2016   Type II diabetes mellitus with peripheral circulatory disorder ([place]) 05/23/2016   Hyperlipidemia associated with type 2 diabetes mellitus ([place]) 05/23/2016   B12 deficiency 05/23/2016   Short-term memory loss 05/23/2016   Overweight (BMI 25.0-29.9) 05/23/2016   Edema of right lower extremity 05/23/2016   ICH  (intracerebral hemorrhage) ([place] [employer]) 02/07/2013    No Known Allergies  History reviewed. No pertinent surgical history.  Social History   Tobacco Use   Smoking status: Former    Packs/day: 0.25    Years: 50.00    Pack years: 12.50    Types: Cigarettes    Quit date: 2009    Years since quitting: 13.8   Smokeless tobacco: Never   Tobacco comments:    smoking cessation materials not required  Vaping Use   Vaping Use: Never used  Substance Use Topics   Alcohol use: Yes    Comment: every now and then   Drug use: Never     Medication list has been reviewed and updated.  Current Meds  Medication Sig   Blood Glucose Monitoring Suppl (ACCU-CHEK GUIDE ME) w/Device KIT USE UP TO FOUR TIMES DAILY AS DIRECTED   glucose blood test strip Use per insurance brand. Diagnosis:Diabetes Mellitus 2 Uncontrolled without Complications = H82. 65. Check Blood Sugar up to four times daily.   JANUMET 50-500 MG tablet TAKE 1 TABLET BY MOUTH TWICE DAILY WITH A MEAL   Lancets MISC Use per insurance brand. To use with Glucometer to monitor sugar up to four times daily.   Vitamin D, Cholecalciferol, 25 MCG (1000 UT) CAPS Take 1 capsule by mouth daily.    PHQ 2/9 Scores 12/22/2020 07/22/2020 04/27/2020 04/03/2020  PHQ - 2 Score 0 0 0 0  PHQ- 9 Score 1 - 0 0    GAD 7 : Generalized Anxiety Score 12/22/2020 04/27/2020 04/03/2020  Nervous, Anxious, on Edge 0 0 0  Control/stop worrying 0 0 0  Worry too much - different things 0 0 0  Trouble relaxing 0 0 0  Restless 0 0 0  Easily annoyed or irritable 0 0 0  Afraid - awful might happen 0 0 0  Total GAD 7 Score 0 0 0  Anxiety Difficulty Not difficult at all - -    BP Readings from Last 3 Encounters:  12/22/20 (!) 146/96  04/27/20 124/82  04/03/20 130/80    Physical Exam Vitals and nursing note reviewed.  Constitutional:      General: She is not in acute distress.    Appearance: Normal appearance. She is well-developed.  HENT:     Head:  Normocephalic and atraumatic.  Neck:     Vascular: No carotid bruit.  Cardiovascular:     Rate and Rhythm: Normal rate and regular rhythm.     Pulses: Normal pulses.  Pulmonary:     Effort: Pulmonary effort is normal. No respiratory distress.     Breath sounds: No wheezing or rhonchi.  Musculoskeletal:     Cervical back: Normal range of motion.     Right lower leg: Edema present.     Left lower leg: No edema.  Lymphadenopathy:     Cervical: No cervical adenopathy.  Skin:    General: Skin is warm and dry.     Capillary Refill: Capillary refill takes less than 2 seconds.     Findings: No rash.  Neurological:     General: No focal deficit present.     Mental Status: She  is alert.     Cranial Nerves: Cranial nerves 2-12 are intact.     Gait: Gait normal.  Psychiatric:        Attention and Perception: Attention normal.        Mood and Affect: Mood normal.        Behavior: Behavior normal. Behavior is cooperative.        Cognition and Memory: Memory is impaired.    Wt Readings from Last 3 Encounters:  12/22/20 199 lb 6.4 oz (90.4 kg)  04/27/20 197 lb (89.4 kg)  04/03/20 195 lb (88.5 kg)    BP (!) 146/96   Pulse 76   Ht _0  (1.753 m)   Wt 199 lb 6.4 oz (90.4 kg)   SpO2 97%   BMI 29.45 kg/m   Assessment and Plan: 1. Type II diabetes mellitus with peripheral circulatory disorder (HCC) Continue Janumet as prescribed. Will obtain labs and refill meds.  She may have been out for some time. - sitaGLIPtin-metformin (JANUMET) 50-500 MG tablet; Take 1 tablet by mouth 2 (two) times daily with a meal.  Dispense: 60 tablet; Refill: 1 - Comprehensive metabolic panel - Hemoglobin A1c  2. Essential hypertension BP not controlled today. Resume previous medication and recheck next visit. - lisinopril-hydrochlorothiazide (ZESTORETIC) 10-12.5 MG tablet; Take 1 tablet by mouth daily.  Dispense: 30 tablet; Refill: 1  3. Seizure disorder as sequela of cerebrovascular accident [facility]) No  recent seizures despite being hospitalized in June at [facility]. Meds were refilled earlier today - to be picked up at the pharmacy. Refer back to [facility] - Ambulatory referral to Neurology  4. Hyperlipidemia associated with type 2 diabetes mellitus ([place]) Resume statin medication. - pravastatin (PRAVACHOL) 40 MG tablet; Take 1 tablet (40 mg total) by mouth daily.  Dispense: 30 tablet; Refill: 1  5. Cerebrovascular accident (CVA), unspecified mechanism ([place]) Continue Keppra; not currently on any anti-coagulation - Ambulatory referral to Neurology  6. Memory loss Resume Aricept. Husband will supervise medications from now on - set up pill box and make sure she takes her medications daily. - donepezil (ARICEPT) 5 MG tablet; Take 1 tablet (5 mg total) by mouth daily.  Dispense: 30 tablet; Refill: 1   Partially dictated using Editor, commissioning. Any errors are unintentional.  Halina Maidens, MD [facility]  12/22/2020

## 2020-12-22 NOTE — Telephone Encounter (Signed)
Requested medication (s) are due for refill today:   Yes Pt is requesting a 90 day supply  Requested medication (s) are on the active medication list:   Yes  Future visit scheduled:   Yes today 12/22/2020   Last ordered: 12/21/2020 #60, 0 refill  Returned because it's non delegated and a 90 day supply is being requested   Requested Prescriptions  Pending Prescriptions Disp Refills   levETIRAcetam (KEPPRA) 1000 MG tablet [Pharmacy Med Name: LEVETIRACETAM 1000MG  TABLETS] 180 tablet     Sig: TAKE 1 TABLET(1000 MG) BY MOUTH TWICE DAILY     Not Delegated - Neurology:  Anticonvulsants Failed - 12/21/2020  4:06 PM      Failed - This refill cannot be delegated      Passed - HCT in normal range and within 360 days    HCT  Date Value Ref Range Status  04/03/2020 40.5 36.0 - 46.0 % Final   Hematocrit  Date Value Ref Range Status  08/17/2018 38.6 34.0 - 46.6 % Final          Passed - HGB in normal range and within 360 days    Hemoglobin  Date Value Ref Range Status  04/03/2020 13.2 12.0 - 15.0 g/dL Final  04/05/2020 42/35/3614 11.1 - 15.9 g/dL Final          Passed - PLT in normal range and within 360 days    Platelets  Date Value Ref Range Status  04/03/2020 235 150 - 400 K/uL Final  08/17/2018 208 150 - 450 x10E3/uL Final          Passed - WBC in normal range and within 360 days    WBC  Date Value Ref Range Status  04/03/2020 5.3 4.0 - 10.5 K/uL Final          Passed - Valid encounter within last 12 months    Recent Outpatient Visits           7 months ago Cellulitis of left lower extremity   [facility] 04/05/2020, MD   8 months ago Type II diabetes mellitus with complication [facility])   [facility], MD   2 years ago Annual physical exam   [facility], MD   2 years ago Hyperlipidemia associated with type 2 diabetes mellitus [facility])   [facility], MD   2 years ago Essential  hypertension   [facility], MD       Future Appointments             Today Reubin [place], MD [facility]

## 2020-12-23 LAB — COMPREHENSIVE METABOLIC PANEL
ALT: 15 IU/L (ref 0–32)
AST: 16 IU/L (ref 0–40)
Albumin/Globulin Ratio: 1.3 (ref 1.2–2.2)
Albumin: 4.7 g/dL (ref 3.7–4.7)
Alkaline Phosphatase: 96 IU/L (ref 44–121)
BUN/Creatinine Ratio: 10 — ABNORMAL LOW (ref 12–28)
BUN: 10 mg/dL (ref 8–27)
Bilirubin Total: 0.3 mg/dL (ref 0.0–1.2)
CO2: 18 mmol/L — ABNORMAL LOW (ref 20–29)
Calcium: 10.3 mg/dL (ref 8.7–10.3)
Chloride: 104 mmol/L (ref 96–106)
Creatinine, Ser: 0.97 mg/dL (ref 0.57–1.00)
Globulin, Total: 3.6 g/dL (ref 1.5–4.5)
Glucose: 84 mg/dL (ref 70–99)
Potassium: 4.1 mmol/L (ref 3.5–5.2)
Sodium: 142 mmol/L (ref 134–144)
Total Protein: 8.3 g/dL (ref 6.0–8.5)
eGFR: 59 mL/min/{1.73_m2} — ABNORMAL LOW (ref >59)

## 2020-12-23 LAB — HEMOGLOBIN A1C
Est. average glucose Bld gHb Est-mCnc: 137 mg/dL
Hgb A1c MFr Bld: 6.4 % — ABNORMAL HIGH (ref 4.8–5.6)

## 2021-01-21 ENCOUNTER — Other Ambulatory Visit: Payer: Self-pay | Admitting: Internal Medicine

## 2021-01-21 DIAGNOSIS — I10 Essential (primary) hypertension: Secondary | ICD-10-CM

## 2021-01-21 DIAGNOSIS — E785 Hyperlipidemia, unspecified: Secondary | ICD-10-CM

## 2021-01-21 DIAGNOSIS — R413 Other amnesia: Secondary | ICD-10-CM

## 2021-01-22 NOTE — Telephone Encounter (Signed)
Requested medication (s) are due for refill today: NO, written and filled 11/15 with one refill  Requested medication (s) are on the active medication list: yes  Last refill:  12/22/20  Future visit scheduled: yes 02/03/21  Notes to clinic:  has refill left on each rx, as well as appt this month, 02/03/21. Please assess.   Requested Prescriptions  Pending Prescriptions Disp Refills   lisinopril-hydrochlorothiazide (ZESTORETIC) 10-12.5 MG tablet [Pharmacy Med Name: LISINOPRIL-HCTZ 10/12.5MG  TABLETS] 90 tablet     Sig: TAKE 1 TABLET BY MOUTH DAILY     Cardiovascular:  ACEI + Diuretic Combos Failed - 01/21/2021 11:27 AM      Failed - Last BP in normal range    BP Readings from Last 1 Encounters:  12/22/20 (!) 146/96          Passed - Na in normal range and within 180 days    Sodium  Date Value Ref Range Status  12/22/2020 142 134 - 144 mmol/L Final          Passed - K in normal range and within 180 days    Potassium  Date Value Ref Range Status  12/22/2020 4.1 3.5 - 5.2 mmol/L Final          Passed - Cr in normal range and within 180 days    Creatinine, Ser  Date Value Ref Range Status  12/22/2020 0.97 0.57 - 1.00 mg/dL Final          Passed - Ca in normal range and within 180 days    Calcium  Date Value Ref Range Status  12/22/2020 10.3 8.7 - 10.3 mg/dL Final          Passed - Patient is not pregnant      Passed - Valid encounter within last 6 months    Recent Outpatient Visits           1 month ago Type II diabetes mellitus with peripheral circulatory disorder [facility])   [facility] Reubin Milan, MD   9 months ago Cellulitis of left lower extremity   [facility] Reubin Milan, MD   9 months ago Type II diabetes mellitus with complication [facility])   [facility] Reubin Milan, MD   2 years ago Annual physical exam   [facility] Reubin Milan, MD   2 years ago Hyperlipidemia associated with type 2  diabetes mellitus [facility])   [facility] Reubin Milan, MD       Future Appointments             In 1 week Reubin Milan, MD [facility]             pravastatin (PRAVACHOL) 40 MG tablet [Pharmacy Med Name: PRAVASTATIN 40MG  TABLETS] 90 tablet     Sig: TAKE 1 TABLET(40 MG) BY MOUTH DAILY     Cardiovascular:  Antilipid - Statins Failed - 01/21/2021 11:27 AM      Failed - LDL in normal range and within 360 days    LDL Calculated  Date Value Ref Range Status  08/17/2018 81 0 - 99 mg/dL Final   LDL Cholesterol  Date Value Ref Range Status  04/03/2020 120 (H) 0 - 99 mg/dL Final    Comment:           Total Cholesterol/HDL:CHD Risk Coronary Heart Disease Risk Table  Men   Women  1/2 Average Risk   3.4   3.3  Average Risk       5.0   4.4  2 X Average Risk   9.6   7.1  3 X Average Risk  23.4   11.0        Use the calculated Patient Ratio above and the CHD Risk Table to determine the patient's CHD Risk.        ATP III CLASSIFICATION (LDL):  <100     mg/dL   Optimal  017-494  mg/dL   Near or Above                    Optimal  130-159  mg/dL   Borderline  496-759  mg/dL   High  >163     mg/dL   Very High Performed at [facility], [address] [place], [place] [address]           Passed - Total Cholesterol in normal range and within 360 days    Cholesterol, Total  Date Value Ref Range Status  08/17/2018 154 100 - 199 mg/dL Final   Cholesterol  Date Value Ref Range Status  04/03/2020 199 0 - 200 mg/dL Final          Passed - HDL in normal range and within 360 days    HDL  Date Value Ref Range Status  04/03/2020 56 >40 mg/dL Final  99/35/7017 47 >79 mg/dL Final          Passed - Triglycerides in normal range and within 360 days    Triglycerides  Date Value Ref Range Status  04/03/2020 117 <150 mg/dL Final          Passed - Patient is not pregnant      Passed - Valid encounter within last 12  months    Recent Outpatient Visits           1 month ago Type II diabetes mellitus with peripheral circulatory disorder [facility])   [facility] Reubin Milan, MD   9 months ago Cellulitis of left lower extremity   [facility] Reubin Milan, MD   9 months ago Type II diabetes mellitus with complication [facility])   [facility] Reubin Milan, MD   2 years ago Annual physical exam   [facility] Reubin Milan, MD   2 years ago Hyperlipidemia associated with type 2 diabetes mellitus [facility])   [facility] Reubin Milan, MD       Future Appointments             In 1 week Judithann Graves Nyoka Cowden, MD [facility]             donepezil (ARICEPT) 5 MG tablet [Pharmacy Med Name: DONEPEZIL 5MG  TABLETS] 90 tablet     Sig: TAKE 1 TABLET(5 MG) BY MOUTH DAILY     Neurology:  Alzheimer's Agents Passed - 01/21/2021 11:27 AM      Passed - Valid encounter within last 6 months    Recent Outpatient Visits           1 month ago Type II diabetes mellitus with peripheral circulatory disorder [facility])   [facility], MD   9 months ago Cellulitis of left lower extremity   [facility], MD   9 months ago Type II diabetes mellitus with complication (HCC)  [facility] Reubin Milan, MD   2 years ago Annual physical exam   [facility] Reubin Milan, MD   2 years ago Hyperlipidemia associated with type 2 diabetes mellitus [facility])   [facility] Reubin Milan, MD       Future Appointments             In 1 week Judithann Graves Nyoka Cowden, MD [facility], [facility]

## 2021-01-29 ENCOUNTER — Ambulatory Visit: Payer: Medicare Other | Admitting: Internal Medicine

## 2021-04-14 ENCOUNTER — Other Ambulatory Visit: Payer: Self-pay | Admitting: Internal Medicine

## 2021-04-14 DIAGNOSIS — E1151 Type 2 diabetes mellitus with diabetic peripheral angiopathy without gangrene: Secondary | ICD-10-CM

## 2021-04-15 NOTE — Telephone Encounter (Signed)
Requested Prescriptions  ?Pending Prescriptions Disp Refills  ?? JANUMET 50-500 MG tablet [Pharmacy Med Name: JANUMET 50/500MG TABLETS] 60 tablet 1  ?  Sig: TAKE 1 TABLET BY MOUTH TWICE DAILY WITH A MEAL  ?  ? Endocrinology:  Diabetes - Biguanide + DPP-4 Inhibitor Combos Failed - 04/14/2021  7:19 PM  ?  ?  Failed - eGFR in normal range and within 360 days  ?  GFR calc Af Amer  ?Date Value Ref Range Status  ?04/02/2019 >60 >60 mL/min Final  ? ?GFR, Estimated  ?Date Value Ref Range Status  ?04/03/2020 57 (L) >60 mL/min Final  ?  Comment:  ?  (NOTE) ?Calculated using the CKD-EPI Creatinine Equation (2021) ?  ? ?eGFR  ?Date Value Ref Range Status  ?12/22/2020 59 (L) >59 mL/min/1.73 Final  ?   ?  ?  Failed - CBC within normal limits and completed in the last 12 months  ?  WBC  ?Date Value Ref Range Status  ?04/03/2020 5.3 4.0 - 10.5 K/uL Final  ? ?RBC  ?Date Value Ref Range Status  ?04/03/2020 4.63 3.87 - 5.11 MIL/uL Final  ? ?Hemoglobin  ?Date Value Ref Range Status  ?04/03/2020 13.2 12.0 - 15.0 g/dL Final  ?08/17/2018 12.5 11.1 - 15.9 g/dL Final  ? ?HCT  ?Date Value Ref Range Status  ?04/03/2020 40.5 36.0 - 46.0 % Final  ? ?Hematocrit  ?Date Value Ref Range Status  ?08/17/2018 38.6 34.0 - 46.6 % Final  ? ?MCHC  ?Date Value Ref Range Status  ?04/03/2020 32.6 30.0 - 36.0 g/dL Final  ? ?MCH  ?Date Value Ref Range Status  ?04/03/2020 28.5 26.0 - 34.0 pg Final  ? ?MCV  ?Date Value Ref Range Status  ?04/03/2020 87.5 80.0 - 100.0 fL Final  ?08/17/2018 86 79 - 97 fL Final  ? ?No results found for: PLTCOUNTKUC, LABPLAT, [place] ?RDW  ?Date Value Ref Range Status  ?04/03/2020 14.2 11.5 - 15.5 % Final  ?08/17/2018 13.9 11.7 - 15.4 % Final  ? ?  ?  ?  Passed - HBA1C is between 0 and 7.9 and within 180 days  ?  Hemoglobin A1C  ?Date Value Ref Range Status  ?09/05/2019 6.4  Final  ? ?Hgb A1c MFr Bld  ?Date Value Ref Range Status  ?12/22/2020 6.4 (H) 4.8 - 5.6 % Final  ?  Comment:  ?           Prediabetes: 5.7 - 6.4 ?         Diabetes:  >6.4 ?         Glycemic control for adults with diabetes: <7.0 ?  ?   ?  ?  Passed - Cr in normal range and within 360 days  ?  Creatinine, Ser  ?Date Value Ref Range Status  ?12/22/2020 0.97 0.57 - 1.00 mg/dL Final  ?   ?  ?  Passed - B12 Level in normal range and within 720 days  ?  Vitamin B-12  ?Date Value Ref Range Status  ?09/05/2019 463  Final  ?   ?  ?  Passed - Valid encounter within last 6 months  ?  Recent Outpatient Visits   ?      ? 3 months ago Type II diabetes mellitus with peripheral circulatory disorder ([place])  ? [facility] Glean Hess, MD  ? 11 months ago Cellulitis of left lower extremity  ? [facility] Glean Hess, MD  ? 1 year ago Type II  diabetes mellitus with complication ([place])  ? [facility] Glean Hess, MD  ? 2 years ago Annual physical exam  ? [facility] Glean Hess, MD  ? 2 years ago Hyperlipidemia associated with type 2 diabetes mellitus ([place])  ? [facility] Glean Hess, MD  ?  ?  ? ?  ?  ?  ? ? ?

## 2021-04-24 ENCOUNTER — Other Ambulatory Visit: Payer: Self-pay | Admitting: Internal Medicine

## 2021-04-24 DIAGNOSIS — E1151 Type 2 diabetes mellitus with diabetic peripheral angiopathy without gangrene: Secondary | ICD-10-CM

## 2021-04-26 NOTE — Telephone Encounter (Signed)
Requested by interface surescripts. Receipt confirmed by pharmacy 04/15/21 at 11:30 am. ?Requested Prescriptions  ?Refused Prescriptions Disp Refills  ?? JANUMET 50-500 MG tablet [Pharmacy Med Name: JANUMET 50/500MG TABLETS] 60 tablet 2  ?  Sig: TAKE 1 TABLET BY MOUTH TWICE DAILY WITH A MEAL  ?  ? Endocrinology:  Diabetes - Biguanide + DPP-4 Inhibitor Combos Failed - 04/24/2021 10:51 AM  ?  ?  Failed - eGFR in normal range and within 360 days  ?  GFR calc Af Amer  ?Date Value Ref Range Status  ?04/02/2019 >60 >60 mL/min Final  ? ?GFR, Estimated  ?Date Value Ref Range Status  ?04/03/2020 57 (L) >60 mL/min Final  ?  Comment:  ?  (NOTE) ?Calculated using the CKD-EPI Creatinine Equation (2021) ?  ? ?eGFR  ?Date Value Ref Range Status  ?12/22/2020 59 (L) >59 mL/min/1.73 Final  ?   ?  ?  Failed - CBC within normal limits and completed in the last 12 months  ?  WBC  ?Date Value Ref Range Status  ?04/03/2020 5.3 4.0 - 10.5 K/uL Final  ? ?RBC  ?Date Value Ref Range Status  ?04/03/2020 4.63 3.87 - 5.11 MIL/uL Final  ? ?Hemoglobin  ?Date Value Ref Range Status  ?04/03/2020 13.2 12.0 - 15.0 g/dL Final  ?08/17/2018 12.5 11.1 - 15.9 g/dL Final  ? ?HCT  ?Date Value Ref Range Status  ?04/03/2020 40.5 36.0 - 46.0 % Final  ? ?Hematocrit  ?Date Value Ref Range Status  ?08/17/2018 38.6 34.0 - 46.6 % Final  ? ?MCHC  ?Date Value Ref Range Status  ?04/03/2020 32.6 30.0 - 36.0 g/dL Final  ? ?MCH  ?Date Value Ref Range Status  ?04/03/2020 28.5 26.0 - 34.0 pg Final  ? ?MCV  ?Date Value Ref Range Status  ?04/03/2020 87.5 80.0 - 100.0 fL Final  ?08/17/2018 86 79 - 97 fL Final  ? ?No results found for: PLTCOUNTKUC, LABPLAT, [place] ?RDW  ?Date Value Ref Range Status  ?04/03/2020 14.2 11.5 - 15.5 % Final  ?08/17/2018 13.9 11.7 - 15.4 % Final  ? ?  ?  ?  Passed - HBA1C is between 0 and 7.9 and within 180 days  ?  Hemoglobin A1C  ?Date Value Ref Range Status  ?09/05/2019 6.4  Final  ? ?Hgb A1c MFr Bld  ?Date Value Ref Range Status  ?12/22/2020 6.4 (H)  4.8 - 5.6 % Final  ?  Comment:  ?           Prediabetes: 5.7 - 6.4 ?         Diabetes: >6.4 ?         Glycemic control for adults with diabetes: <7.0 ?  ?   ?  ?  Passed - Cr in normal range and within 360 days  ?  Creatinine, Ser  ?Date Value Ref Range Status  ?12/22/2020 0.97 0.57 - 1.00 mg/dL Final  ?   ?  ?  Passed - B12 Level in normal range and within 720 days  ?  Vitamin B-12  ?Date Value Ref Range Status  ?09/05/2019 463  Final  ?   ?  ?  Passed - Valid encounter within last 6 months  ?  Recent Outpatient Visits   ?      ? 4 months ago Type II diabetes mellitus with peripheral circulatory disorder ([place])  ? [facility] Glean Hess, MD  ? 12 months ago Cellulitis of left lower extremity  ? [facility]  Glean Hess, MD  ? 1 year ago Type II diabetes mellitus with complication [facility])  ? [facility] Glean Hess, MD  ? 2 years ago Annual physical exam  ? [facility] Glean Hess, MD  ? 3 years ago Hyperlipidemia associated with type 2 diabetes mellitus ([place])  ? [facility] Glean Hess, MD  ?  ?  ? ?  ?  ?  ? ?

## 2021-05-04 ENCOUNTER — Ambulatory Visit: Payer: Medicare Other | Admitting: Internal Medicine

## 2021-05-07 ENCOUNTER — Ambulatory Visit: Payer: Medicare Other | Admitting: Internal Medicine

## 2021-05-07 NOTE — Progress Notes (Deleted)
? ? ?Date:  05/07/2021  ? ?Name:  [name]   DOB:  02/16/1940   MRN:  [id number] ? ? ?Chief Complaint: No chief complaint on file. ? ?Diabetes ?She presents for her follow-up diabetic visit. She has type 2 diabetes mellitus. Her disease course has been stable. Diabetic complications include a CVA. Current diabetic treatments: janumet. She is compliant with treatment all of the time. An ACE inhibitor/angiotensin II receptor blocker is being taken.  ?Hypertension ?The problem is controlled. Past treatments include ACE inhibitors and diuretics. Hypertensive end-organ damage includes kidney disease and CVA. There is no history of CAD/MI.  ?Hyperlipidemia ?This is a chronic problem. Current antihyperlipidemic treatment includes statins. The current treatment provides moderate improvement of lipids.  ? ?Lab Results  ?Component Value Date  ? NA 142 12/22/2020  ? K 4.1 12/22/2020  ? CO2 18 (L) 12/22/2020  ? GLUCOSE 84 12/22/2020  ? BUN 10 12/22/2020  ? CREATININE 0.97 12/22/2020  ? CALCIUM 10.3 12/22/2020  ? EGFR 59 (L) 12/22/2020  ? GFRNONAA 57 (L) 04/03/2020  ? ?Lab Results  ?Component Value Date  ? CHOL 199 04/03/2020  ? HDL 56 04/03/2020  ? LDLCALC 120 (H) 04/03/2020  ? TRIG 117 04/03/2020  ? CHOLHDL 3.6 04/03/2020  ? ?Lab Results  ?Component Value Date  ? TSH 1.70 09/05/2019  ? ?Lab Results  ?Component Value Date  ? HGBA1C 6.4 (H) 12/22/2020  ? ?Lab Results  ?Component Value Date  ? WBC 5.3 04/03/2020  ? HGB 13.2 04/03/2020  ? HCT 40.5 04/03/2020  ? MCV 87.5 04/03/2020  ? PLT 235 04/03/2020  ? ?Lab Results  ?Component Value Date  ? ALT 15 12/22/2020  ? AST 16 12/22/2020  ? ALKPHOS 96 12/22/2020  ? BILITOT 0.3 12/22/2020  ? ?Lab Results  ?Component Value Date  ? VD25OH 51 09/05/2019  ?  ? ?Review of Systems ? ?Patient Active Problem List  ? Diagnosis Date Noted  ? Annual physical exam 09/17/2019  ? Pulmonary nodule 07/23/2019  ? Syncope and collapse 04/08/2019  ? Elevated troponin   ? Cerebrovascular accident  (CVA) ([place]) 12/31/2018  ? Seizure disorder as sequela of cerebrovascular accident ([place]) 12/31/2018  ? Peripheral vascular disease with stasis dermatitis 04/27/2018  ? 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 Essential hypertension 08/18/2016  ? Microalbuminuria 05/24/2016  ? Type II diabetes mellitus with peripheral circulatory disorder ([place]) 05/23/2016  ? Hyperlipidemia associated with type 2 diabetes mellitus ([place]) 05/23/2016  ? B12 deficiency 05/23/2016  ? Short-term memory loss 05/23/2016  ? Overweight (BMI 25.0-29.9) 05/23/2016  ? Edema of right lower extremity 05/23/2016  ? ICH (intracerebral hemorrhage) ([place]) 02/07/2013  ? ? ?No Known Allergies ? ?No past surgical history on file. ? ?Social History  ? ?Tobacco Use  ? Smoking status: Former  ?  Packs/day: 0.25  ?  Years: 50.00  ?  Pack years: 12.50  ?  Types: Cigarettes  ?  Quit date: 2009  ?  Years since quitting: 14.2  ? Smokeless tobacco: Never  ? Tobacco comments:  ?  smoking cessation materials not required  ?Vaping Use  ? Vaping Use: Never used  ?Substance Use Topics  ? Alcohol use: Yes  ?  Comment: every now and then  ? Drug use: Never  ? ? ? ?Medication list has been reviewed and updated. ? ?No outpatient medications have been marked as taking for the 05/07/21 encounter (Appointment) with Glean Hess, MD.  ? ? ? ?  12/22/2020  ? 10:19 AM  04/27/2020  ? 11:05 AM 04/03/2020  ? 10:49 AM  ?GAD 7 : Generalized Anxiety Score  ?Nervous, Anxious, on Edge 0 0 0  ?Control/stop worrying 0 0 0  ?Worry too much - different things 0 0 0  ?Trouble relaxing 0 0 0  ?Restless 0 0 0  ?Easily annoyed or irritable 0 0 0  ?Afraid - awful might happen 0 0 0  ?Total GAD 7 Score 0 0 0  ?Anxiety Difficulty Not difficult at all    ? ? ? ?  12/22/2020  ? 10:19 AM  ?Depression screen PHQ 2/9  ?Decreased Interest 0  ?Down, Depressed, Hopeless 0  ?PHQ - 2 Score 0  ?Altered sleeping 0  ?Tired, decreased energy 0  ?Change in appetite 0  ?Feeling bad or failure about yourself  0  ?Trouble  concentrating 1  ?Moving slowly or fidgety/restless 0  ?Suicidal thoughts 0  ?PHQ-9 Score 1  ?Difficult doing work/chores Not difficult at all  ? ? ?BP Readings from Last 3 Encounters:  ?12/22/20 (!) 146/96  ?04/27/20 124/82  ?04/03/20 130/80  ? ? ?Physical Exam ? ?Wt Readings from Last 3 Encounters:  ?12/22/20 199 lb 6.4 oz (90.4 kg)  ?04/27/20 197 lb (89.4 kg)  ?04/03/20 195 lb (88.5 kg)  ? ? ?There were no vitals taken for this visit. ? ?Assessment and Plan: ? ? ? ? ?

## 2021-05-11 ENCOUNTER — Encounter: Payer: Self-pay | Admitting: Internal Medicine

## 2021-05-11 ENCOUNTER — Ambulatory Visit: Payer: Medicare Other | Admitting: Internal Medicine

## 2021-05-11 NOTE — Progress Notes (Deleted)
? ? ?Date:  05/11/2021  ? ?Name:  [name]   DOB:  08/13/1940   MRN:  [id number] ? ? ?Chief Complaint: No chief complaint on file. ? ?Diabetes ?She presents for her follow-up diabetic visit. She has type 2 diabetes mellitus. Pertinent negatives for hypoglycemia include no headaches or tremors. Pertinent negatives for diabetes include no chest pain, no fatigue, no polydipsia and no polyuria. Symptoms are stable. Current diabetic treatment includes oral agent (dual therapy) (janumet). She is compliant with treatment all of the time.  ?Hypertension ?This is a chronic problem. The problem is controlled. Pertinent negatives include no chest pain, headaches, palpitations or shortness of breath. Past treatments include ACE inhibitors and diuretics. The current treatment provides significant improvement.  ?Hyperlipidemia ?This is a chronic problem. The problem is uncontrolled. Pertinent negatives include no chest pain or shortness of breath. Current antihyperlipidemic treatment includes statins. The current treatment provides moderate improvement of lipids.  ? ?Lab Results  ?Component Value Date  ? NA 142 12/22/2020  ? K 4.1 12/22/2020  ? CO2 18 (L) 12/22/2020  ? GLUCOSE 84 12/22/2020  ? BUN 10 12/22/2020  ? CREATININE 0.97 12/22/2020  ? CALCIUM 10.3 12/22/2020  ? EGFR 59 (L) 12/22/2020  ? GFRNONAA 57 (L) 04/03/2020  ? ?Lab Results  ?Component Value Date  ? CHOL 199 04/03/2020  ? HDL 56 04/03/2020  ? LDLCALC 120 (H) 04/03/2020  ? TRIG 117 04/03/2020  ? CHOLHDL 3.6 04/03/2020  ? ?Lab Results  ?Component Value Date  ? TSH 1.70 09/05/2019  ? ?Lab Results  ?Component Value Date  ? HGBA1C 6.4 (H) 12/22/2020  ? ?Lab Results  ?Component Value Date  ? WBC 5.3 04/03/2020  ? HGB 13.2 04/03/2020  ? HCT 40.5 04/03/2020  ? MCV 87.5 04/03/2020  ? PLT 235 04/03/2020  ? ?Lab Results  ?Component Value Date  ? ALT 15 12/22/2020  ? AST 16 12/22/2020  ? ALKPHOS 96 12/22/2020  ? BILITOT 0.3 12/22/2020  ? ?Lab Results  ?Component Value  Date  ? VD25OH 51 09/05/2019  ?  ? ?Review of Systems  ?Constitutional:  Negative for appetite change, fatigue, fever and unexpected weight change.  ?HENT:  Negative for tinnitus and trouble swallowing.   ?Eyes:  Negative for visual disturbance.  ?Respiratory:  Negative for cough, chest tightness and shortness of breath.   ?Cardiovascular:  Negative for chest pain, palpitations and leg swelling.  ?Gastrointestinal:  Negative for abdominal pain.  ?Endocrine: Negative for polydipsia and polyuria.  ?Genitourinary:  Negative for dysuria and hematuria.  ?Musculoskeletal:  Negative for arthralgias.  ?Neurological:  Negative for tremors, numbness and headaches.  ?Psychiatric/Behavioral:  Negative for dysphoric mood.   ? ?Patient Active Problem List  ? Diagnosis Date Noted  ? Annual physical exam 09/17/2019  ? Pulmonary nodule 07/23/2019  ? Syncope and collapse 04/08/2019  ? Elevated troponin   ? Cerebrovascular accident (CVA) ([facility]) 12/31/2018  ? Seizure disorder as sequela of cerebrovascular accident ([facility]) 12/31/2018  ? Peripheral vascular disease with stasis dermatitis 04/27/2018  ? 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 Essential hypertension 08/18/2016  ? Microalbuminuria 05/24/2016  ? Type II diabetes mellitus with peripheral circulatory disorder ([facility]) 05/23/2016  ? Hyperlipidemia associated with type 2 diabetes mellitus ([facility]) 05/23/2016  ? B12 deficiency 05/23/2016  ? Short-term memory loss 05/23/2016  ? Overweight (BMI 25.0-29.9) 05/23/2016  ? Edema of right lower extremity 05/23/2016  ? ICH (intracerebral hemorrhage) ([facility]) 02/07/2013  ? ? ?No Known Allergies ? ?No past surgical history on file. ? ?  Social History  ? ?Tobacco Use  ? Smoking status: Former  ?  Packs/day: 0.25  ?  Years: 50.00  ?  Pack years: 12.50  ?  Types: Cigarettes  ?  Quit date: 2009  ?  Years since quitting: 14.2  ? Smokeless tobacco: Never  ? Tobacco comments:  ?  smoking cessation materials not required  ?Vaping Use  ? Vaping Use: Never used   ?Substance Use Topics  ? Alcohol use: Yes  ?  Comment: every now and then  ? Drug use: Never  ? ? ? ?Medication list has been reviewed and updated. ? ?No outpatient medications have been marked as taking for the 05/11/21 encounter (Appointment) with ,  H, MD.  ? ? ? ?  12/22/2020  ? 10:19 AM 04/27/2020  ? 11:05 AM 04/03/2020  ? 10:49 AM  ?GAD 7 : Generalized Anxiety Score  ?Nervous, Anxious, on Edge 0 0 0  ?Control/stop worrying 0 0 0  ?Worry too much - different things 0 0 0  ?Trouble relaxing 0 0 0  ?Restless 0 0 0  ?Easily annoyed or irritable 0 0 0  ?Afraid - awful might happen 0 0 0  ?Total GAD 7 Score 0 0 0  ?Anxiety Difficulty Not difficult at all    ? ? ? ?  12/22/2020  ? 10:19 AM  ?Depression screen PHQ 2/9  ?Decreased Interest 0  ?Down, Depressed, Hopeless 0  ?PHQ - 2 Score 0  ?Altered sleeping 0  ?Tired, decreased energy 0  ?Change in appetite 0  ?Feeling bad or failure about yourself  0  ?Trouble concentrating 1  ?Moving slowly or fidgety/restless 0  ?Suicidal thoughts 0  ?PHQ-9 Score 1  ?Difficult doing work/chores Not difficult at all  ? ? ?BP Readings from Last 3 Encounters:  ?12/22/20 (!) 146/96  ?04/27/20 124/82  ?04/03/20 130/80  ? ? ?Physical Exam ?Vitals and nursing note reviewed.  ?Constitutional:   ?   General: She is not in acute distress. ?   Appearance: She is well-developed.  ?HENT:  ?   Head: Normocephalic and atraumatic.  ?Neck:  ?   Vascular: No carotid bruit.  ?Cardiovascular:  ?   Rate and Rhythm: Normal rate and regular rhythm.  ?Pulmonary:  ?   Effort: Pulmonary effort is normal. No respiratory distress.  ?Musculoskeletal:  ?   Cervical back: Normal range of motion.  ?   Right lower leg: No edema.  ?   Left lower leg: No edema.  ?Lymphadenopathy:  ?   Cervical: No cervical adenopathy.  ?Skin: ?   General: Skin is warm and dry.  ?   Findings: No rash.  ?Neurological:  ?   Mental Status: She is alert and oriented to person, place, and time.  ?Psychiatric:     ?   Mood and  Affect: Mood normal.     ?   Behavior: Behavior normal.  ? ? ?Wt Readings from Last 3 Encounters:  ?12/22/20 199 lb 6.4 oz (90.4 kg)  ?04/27/20 197 lb (89.4 kg)  ?04/03/20 195 lb (88.5 kg)  ? ? ?There were no vitals taken for this visit. ? ?Assessment and Plan: ? ? ? ? ?

## 2021-06-02 DIAGNOSIS — I517 Cardiomegaly: Secondary | ICD-10-CM | POA: Diagnosis not present

## 2021-06-02 DIAGNOSIS — G40101 Localization-related (focal) (partial) symptomatic epilepsy and epileptic syndromes with simple partial seizures, not intractable, with status epilepticus: Secondary | ICD-10-CM | POA: Diagnosis not present

## 2021-06-02 DIAGNOSIS — Z659 Problem related to unspecified psychosocial circumstances: Secondary | ICD-10-CM | POA: Diagnosis not present

## 2021-06-02 DIAGNOSIS — G40901 Epilepsy, unspecified, not intractable, with status epilepticus: Secondary | ICD-10-CM | POA: Diagnosis not present

## 2021-06-02 DIAGNOSIS — I248 Other forms of acute ischemic heart disease: Secondary | ICD-10-CM | POA: Diagnosis not present

## 2021-06-02 DIAGNOSIS — R7989 Other specified abnormal findings of blood chemistry: Secondary | ICD-10-CM | POA: Diagnosis not present

## 2021-06-02 DIAGNOSIS — Z7984 Long term (current) use of oral hypoglycemic drugs: Secondary | ICD-10-CM | POA: Diagnosis not present

## 2021-06-02 DIAGNOSIS — I10 Essential (primary) hypertension: Secondary | ICD-10-CM | POA: Diagnosis not present

## 2021-06-02 DIAGNOSIS — R4020 Unspecified coma: Secondary | ICD-10-CM | POA: Diagnosis not present

## 2021-06-02 DIAGNOSIS — S01511A Laceration without foreign body of lip, initial encounter: Secondary | ICD-10-CM | POA: Diagnosis not present

## 2021-06-02 DIAGNOSIS — R569 Unspecified convulsions: Secondary | ICD-10-CM | POA: Diagnosis not present

## 2021-06-02 DIAGNOSIS — M21331 Wrist drop, right wrist: Secondary | ICD-10-CM | POA: Diagnosis not present

## 2021-06-02 DIAGNOSIS — M20091 Other deformity of right finger(s): Secondary | ICD-10-CM | POA: Diagnosis not present

## 2021-06-02 DIAGNOSIS — S0003XA Contusion of scalp, initial encounter: Secondary | ICD-10-CM | POA: Diagnosis not present

## 2021-06-02 DIAGNOSIS — R29818 Other symptoms and signs involving the nervous system: Secondary | ICD-10-CM | POA: Diagnosis not present

## 2021-06-02 DIAGNOSIS — I5189 Other ill-defined heart diseases: Secondary | ICD-10-CM | POA: Diagnosis not present

## 2021-06-02 DIAGNOSIS — R4182 Altered mental status, unspecified: Secondary | ICD-10-CM | POA: Diagnosis not present

## 2021-06-02 DIAGNOSIS — Z91199 Patient's noncompliance with other medical treatment and regimen due to unspecified reason: Secondary | ICD-10-CM | POA: Diagnosis not present

## 2021-06-02 DIAGNOSIS — R0902 Hypoxemia: Secondary | ICD-10-CM | POA: Diagnosis not present

## 2021-06-02 DIAGNOSIS — M47812 Spondylosis without myelopathy or radiculopathy, cervical region: Secondary | ICD-10-CM | POA: Diagnosis not present

## 2021-06-02 DIAGNOSIS — E119 Type 2 diabetes mellitus without complications: Secondary | ICD-10-CM | POA: Diagnosis not present

## 2021-06-02 DIAGNOSIS — R404 Transient alteration of awareness: Secondary | ICD-10-CM | POA: Diagnosis not present

## 2021-06-02 DIAGNOSIS — R778 Other specified abnormalities of plasma proteins: Secondary | ICD-10-CM | POA: Diagnosis not present

## 2021-06-02 DIAGNOSIS — S0083XA Contusion of other part of head, initial encounter: Secondary | ICD-10-CM | POA: Diagnosis not present

## 2021-06-02 DIAGNOSIS — W19XXXA Unspecified fall, initial encounter: Secondary | ICD-10-CM | POA: Diagnosis not present

## 2021-06-02 DIAGNOSIS — S3993XA Unspecified injury of pelvis, initial encounter: Secondary | ICD-10-CM | POA: Diagnosis not present

## 2021-06-02 DIAGNOSIS — E785 Hyperlipidemia, unspecified: Secondary | ICD-10-CM | POA: Diagnosis not present

## 2021-06-13 LAB — BASIC METABOLIC PANEL
BUN: 16 (ref 4–21)
CO2: 25 — AB (ref 13–22)
Chloride: 110 — AB (ref 99–108)
Creatinine: 1 (ref 0.5–1.1)
Glucose: 123
Potassium: 3.9 mEq/L (ref 3.5–5.1)
Sodium: 145 (ref 137–147)

## 2021-06-13 LAB — CBC AND DIFFERENTIAL
HCT: 35 — AB (ref 36–46)
Hemoglobin: 12.1 (ref 12.0–16.0)
Platelets: 168 10*3/uL (ref 150–400)
WBC: 9.9

## 2021-06-13 LAB — COMPREHENSIVE METABOLIC PANEL
Calcium: 9.4 (ref 8.7–10.7)
eGFR: 61

## 2021-06-13 LAB — CBC: RBC: 4.14 (ref 3.87–5.11)

## 2021-06-15 ENCOUNTER — Telehealth: Payer: Self-pay

## 2021-06-15 NOTE — Telephone Encounter (Signed)
Transition Care Management Follow-up Telephone Call ?Date of discharge and from where: 06/13/21 [facility]  ?How have you been since you were released from the hospital? Pt's husband states she is doing okay ?Any questions or concerns? No ? ?Items Reviewed: ?Did the pt receive and understand the discharge instructions provided? Yes  ?Medications obtained and verified? Yes  ?Other? No  ?Any new allergies since your discharge? No  ?Dietary orders reviewed? Yes ?Do you have support at home? Yes  ? ?Home Care and Equipment/Supplies: ?Were home health services ordered? yes ?If so, what is the name of the agency? Pt's husband Merton Border unsure but states she has a caregiver with her  ?Has the agency set up a time to come to the patient's home? unknown ?Were any new equipment or medical supplies ordered?  Yes: transfer bench, walker, commode seat ?What is the name of the medical supply agency? N/a Merton Border states they have necessary DME supplies at home ? ? ?Functional Questionnaire: (I = Independent and D = Dependent) ?ADLs: I with assistance ? ?Bathing/Dressing- D ? ?Meal Prep- D ? ?Eating- I ? ?Maintaining continence- I ? ?Transferring/Ambulation- I with walker ? ?Managing Meds- D ? ?Follow up appointments reviewed: ? ?PCP Hospital f/u appt confirmed? Yes  Scheduled to see Dr. Judithann Graves on 06/21/21 @ 3:40. ?Specialist Hospital f/u appt confirmed? No  Pt to scheduled with neurology ?Are transportation arrangements needed? No  ?If their condition worsens, is the pt aware to call PCP or go to the Emergency Dept.? Yes ?Was the patient provided with contact information for the PCP's office or ED? Yes ?Was to pt encouraged to call back with questions or concerns? Yes ? ?

## 2021-06-21 ENCOUNTER — Encounter: Payer: Self-pay | Admitting: Internal Medicine

## 2021-06-21 ENCOUNTER — Ambulatory Visit (INDEPENDENT_AMBULATORY_CARE_PROVIDER_SITE_OTHER): Payer: Medicare (Managed Care) | Admitting: Internal Medicine

## 2021-06-21 VITALS — BP 138/82 | HR 87 | Ht 69.0 in | Wt 186.0 lb

## 2021-06-21 DIAGNOSIS — E1151 Type 2 diabetes mellitus with diabetic peripheral angiopathy without gangrene: Secondary | ICD-10-CM | POA: Diagnosis not present

## 2021-06-21 DIAGNOSIS — I69398 Other sequelae of cerebral infarction: Secondary | ICD-10-CM | POA: Diagnosis not present

## 2021-06-21 DIAGNOSIS — R748 Abnormal levels of other serum enzymes: Secondary | ICD-10-CM | POA: Diagnosis not present

## 2021-06-21 DIAGNOSIS — M21331 Wrist drop, right wrist: Secondary | ICD-10-CM | POA: Diagnosis not present

## 2021-06-21 DIAGNOSIS — I10 Essential (primary) hypertension: Secondary | ICD-10-CM | POA: Diagnosis not present

## 2021-06-21 DIAGNOSIS — G40909 Epilepsy, unspecified, not intractable, without status epilepticus: Secondary | ICD-10-CM | POA: Diagnosis not present

## 2021-06-21 MED ORDER — JANUMET 50-500 MG PO TABS: 1.0000 | ORAL_TABLET | Freq: Two times a day (BID) | ORAL | 1 refills | Status: AC

## 2021-06-21 MED ORDER — LISINOPRIL-HYDROCHLOROTHIAZIDE 10-12.5 MG PO TABS: 1.0000 | ORAL_TABLET | Freq: Every day | ORAL | 1 refills | Status: DC

## 2021-06-21 NOTE — Patient Instructions (Signed)
[place]   ?[place]   ?[address]   ?[place],  [address]   ?[phone]   ?

## 2021-06-21 NOTE — Progress Notes (Signed)
? ? ?Date:  06/21/2021  ? ?Name:  [name]   DOB:  01-09-1941   MRN:  [id number] ? ? ?Chief Complaint: Hospitalization Follow-up ?Hospital follow up.  Admitted to [facility] 06/02/21 to 06/13/21.  TOC call done on 06/14/21. ?She went home with her husband and a sitter when he is at work.  She has done well, no further seizures, no falls, walking with walker.  Right wrist drop is persistent.  She does not have a neurology follow up. ? ?Assessment: [name] is a 81 y.o. female with a pertinent past medical history of seizures, HTN, HLD, and T2DM who was admitted to [facility] on 06/02/2021 for seizure activity concerning for focal status epilepticus. ? ?ACTIVE PROBLEMS: ?# Focal status epilepticus  Epilepsy: Patient presented to the ED as a code stroke after being found down by her husband after work. LKN 9:30 AM. Initially with fixed left gaze, awakens to noxious but does not respond or follow any commands, briskly withdrawing in all 4 extremities. NIHSS 27. Noted to have laceration on arm, scalp hematoma, lip laceration, and dried blood around the mouth. Overall, highest concern for focal status epilepticus given her history of seizures, with previously documented left gaze deviation associated with seizures. CT head without hemorrhage. She was most recently admitted to [facility] for seizures in June 2022 and discharged on Keppra 1000 mg BID, with last fill history 12/22/2020 for a 90 day supply raising concern for medication non-compliance. She received 2 mg IV Ativan for prolonged gaze deviation concerning for focal seizure without resolution, followed by 3 mg IV Keppra load (Keppra level drawn prior to load) after which gaze returned to midline. Remained responsive only to noxious stimuli but protecting airway. Keppra level <2 in the ED, confirming noncompliance. CBC with leukocytosis to 13.5 which could be attributed to convulsive seizure prior to arrival or infection. Low concern for stroke given lack of  focality outside of gaze deviation. LP deferred as pt afebrile and nontoxic appearing. ?Workup: ?Resulted: ?- EKG with sinus rhythm, prolonged QT ?- CT head wo contrast with no acute intracranial abnormalities ?- CT maxillofacial with possible nondisplaced fracture of anterior inferior wall of left maxillary sinus ?- CT cervical spine with no evidence of acute fracture ?- WBC 13.5, Cr 1.06 ?- Troponin 1275. CK 1790 ?- Levetiracetam level <2.0 ?- Utox negative ?- UA neg for LE and nitrites. Many bacteria. 2 WBC ?- Ethanol level neg ?- EEG with diffuse 6 to 7 Hz theta activity with abundant delta slowing over left hemisphere, maximal over left temporal region. Occasional spike discharges over left fronto temporal region. ? ?Plan: ?- cvEEG stopped 4/28 ?- Continue home Keppra 1000 mg BID ?- Strict delirium and fall precautions ?>>> no further seizures.  She has been compliant with medications - her husband has been administering them. ? ?# R wrist and finger drop, localizes to the motor radial nerve distally from the branches to the triceps muscle: Pt noted 3 days ago (4/30), not previously seen on exam; however, likely not fully evaluated due to placement of PIV. Unable to lift wrist and fingers. Supination of wrist is difficult on right, but forearm supination is intact. Sensation and strength intact in RUE otherwise. No other deficits noted making brachial plexus abnormality unlikely. Removed PIV. Pending imaging of R radial nerve. OT placed custom wrist splint. ?- MRI RUE for radial nerve evaluation.  ?- If cannot be completed prior to discharge tmr, will order outpatient MRI ?>>> wrist drop is persistent.  She has not been wearing her splints.  It is not painful or numb.   ? ?# Elevated Troponin  Possible NSTEMI: Troponin 1275 in the ED. EKG with sinus rhythm, prolonged QT interval. No ST segment changes. Rose to 2242, now downtrending 2073 (4/27)--> 1300 (4/28). Echo with normal EF, grade II diastolic  dysfunction, mildly thickened mitral and aortic leaflets. Spoke with cards, likely represents myocardial injury without infarction vs. noncardiac elevation in the setting of her status epilepticus and LVH. No further workup needed. ?- CTM ?>>> no chest pain or shortness of breath.  Ambulating with a walker with a slow steady pace. ? ?STABLE PROBLEMS: ?# HTN: Previously on lisinopril-hydrochlorothiazide 10/12.5 mg daily at home, last filled 01/22/2021. Hypertensive to 035W systolic in the ED. ?- Lisinopril 48m daily  ?>>> taking medication as prescribed. ? ?# DMII: On Janumet at home, last filled one month ago. ?- SSI ?>>> taking oral medications daily.  Getting 2 meals per day from meals on wheels which are diabetic appropriate.  She does not check her BS.  No symptoms of hypoglycemia. ? ?# HLD:  ?- Holding statin in setting of elevated CK. Downtrending: 1970 (4/27)--> 1160 (4/28) ?>>> Pravachol was stopped due to elevated CPK.  Will need labs rechecked. ? ?# Discharge Planning:  ?- Case management: consulted. Recommendations appreciated. ?- Social work: consulted. Recommendations appreciated. ?- PT: 5xL ?- OT: 5xH ?- SLP: 5xL ?- PM&R: N/A ?- Expected Discharge Disposition: Skilled nursing facility.  ?>>> she did not go to SNF.  She went home with her husband and a daytime sitter. ? ?# Checklist: ?- Diet: Regular diet ?- IV fluids: no ?- Bowel Regimen: senna ?- GI PPX: No GI indications ?- DVT PPX: Lovenox 40 mg [place] daily ? ?Medications at Time of Discharge ?Medication Sig Dispensed Refills Start Date End Date  ?levETIRAcetam (KEPPRA) 1000 MG tablet   Take 1 tablet (1,000 mg total) by mouth Two (2) times a day. 60 tablet   0 06/12/2021 07/12/2021  ?lisinopriL-hydrochlorothiazide (PRINZIDE,ZESTORETIC) 10-12.5 mg per tablet   Take 1 tablet by mouth daily. 30 tablet   0 06/12/2021 07/12/2021  ?sitaGLIPtin-metFORMIN (JANUMET) 50-500 mg per tablet   Take 1 tablet by mouth 2 (two) times a day with meals.   0      ? ? [place]  ?[address]  ?[address]   ?[place] [address]  ?[phone]  ?HPI ? ?Lab Results  ?Component Value Date  ? NA 145 06/13/2021  ? K 3.9 06/13/2021  ? CO2 25 (A) 06/13/2021  ? GLUCOSE 84 12/22/2020  ? BUN 16 06/13/2021  ? CREATININE 1.0 06/13/2021  ? CALCIUM 9.4 06/13/2021  ? EGFR 61 06/13/2021  ? GFRNONAA 57 (L) 04/03/2020  ? ?Lab Results  ?Component Value Date  ? CHOL 199 04/03/2020  ? HDL 56 04/03/2020  ? LDLCALC 120 (H) 04/03/2020  ? TRIG 117 04/03/2020  ? CHOLHDL 3.6 04/03/2020  ? ?Lab Results  ?Component Value Date  ? TSH 1.70 09/05/2019  ? ?Lab Results  ?Component Value Date  ? HGBA1C 6.4 (H) 12/22/2020  ? ?Lab Results  ?Component Value Date  ? WBC 9.9 06/13/2021  ? HGB 12.1 06/13/2021  ? HCT 35 (A) 06/13/2021  ? MCV 87.5 04/03/2020  ? PLT 168 06/13/2021  ? ?Lab Results  ?Component Value Date  ? ALT 15 12/22/2020  ? AST 16 12/22/2020  ? ALKPHOS 96 12/22/2020  ?  BILITOT 0.3 12/22/2020  ? ?Lab Results  ?Component Value Date  ? VD25OH 51 09/05/2019  ?  ? ?Review of Systems  ?Constitutional:  Negative for fatigue and unexpected weight change.  ?HENT:  Negative for nosebleeds.   ?Eyes:  Negative for visual disturbance.  ?Respiratory:  Negative for cough, chest tightness, shortness of breath and wheezing.   ?Cardiovascular:  Negative for chest pain, palpitations and leg swelling.  ?Gastrointestinal:  Negative for abdominal pain, constipation and diarrhea.  ?Musculoskeletal:  Positive for gait problem (walking with a walker).  ?Neurological:  Positive for weakness (right wrist and fingers). Negative for dizziness, light-headedness and headaches.  ?Psychiatric/Behavioral:  Negative for dysphoric mood and sleep disturbance. The patient is not nervous/anxious.   ? ?Patient Active Problem List  ? Diagnosis Date Noted  ? Pulmonary nodule 07/23/2019  ? Cerebrovascular accident (CVA) ([place]) 12/31/2018  ? Seizure disorder as sequela of  cerebrovascular accident ([place]) 12/31/2018  ? Peripheral vascular disease with stasis dermatitis 04/27/2018  ? 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 Essential hypertension 08/18/2016  ? Microalbuminuria 05/24/2016  ? Type II diabet

## 2021-06-23 ENCOUNTER — Other Ambulatory Visit: Payer: Self-pay | Admitting: Internal Medicine

## 2021-06-23 ENCOUNTER — Ambulatory Visit: Payer: Self-pay

## 2021-06-23 DIAGNOSIS — R569 Unspecified convulsions: Secondary | ICD-10-CM

## 2021-06-23 LAB — COMPREHENSIVE METABOLIC PANEL
ALT: 14 IU/L (ref 0–32)
AST: 16 IU/L (ref 0–40)
Albumin/Globulin Ratio: 1.4 (ref 1.2–2.2)
Albumin: 4.4 g/dL (ref 3.7–4.7)
Alkaline Phosphatase: 84 IU/L (ref 44–121)
BUN/Creatinine Ratio: 17 (ref 12–28)
BUN: 30 mg/dL — ABNORMAL HIGH (ref 8–27)
Bilirubin Total: 0.2 mg/dL (ref 0.0–1.2)
CO2: 21 mmol/L (ref 20–29)
Calcium: 10.8 mg/dL — ABNORMAL HIGH (ref 8.7–10.3)
Chloride: 100 mmol/L (ref 96–106)
Creatinine, Ser: 1.78 mg/dL — ABNORMAL HIGH (ref 0.57–1.00)
Globulin, Total: 3.1 g/dL (ref 1.5–4.5)
Glucose: 89 mg/dL (ref 70–99)
Potassium: 4.7 mmol/L (ref 3.5–5.2)
Sodium: 136 mmol/L (ref 134–144)
Total Protein: 7.5 g/dL (ref 6.0–8.5)
eGFR: 29 mL/min/{1.73_m2} — ABNORMAL LOW (ref >59)

## 2021-06-23 LAB — CBC WITH DIFFERENTIAL/PLATELET
Basophils Absolute: 0 10*3/uL (ref 0.0–0.2)
Basos: 0 %
EOS (ABSOLUTE): 0.3 10*3/uL (ref 0.0–0.4)
Eos: 4 %
Hematocrit: 37.5 % (ref 34.0–46.6)
Hemoglobin: 12.3 g/dL (ref 11.1–15.9)
Immature Grans (Abs): 0 10*3/uL (ref 0.0–0.1)
Immature Granulocytes: 0 %
Lymphocytes Absolute: 2.7 10*3/uL (ref 0.7–3.1)
Lymphs: 39 %
MCH: 27.4 pg (ref 26.6–33.0)
MCHC: 32.8 g/dL (ref 31.5–35.7)
MCV: 84 fL (ref 79–97)
Monocytes Absolute: 0.5 10*3/uL (ref 0.1–0.9)
Monocytes: 8 %
Neutrophils Absolute: 3.4 10*3/uL (ref 1.4–7.0)
Neutrophils: 49 %
Platelets: 220 10*3/uL (ref 150–450)
RBC: 4.49 x10E6/uL (ref 3.77–5.28)
RDW: 13.2 % (ref 11.7–15.4)
WBC: 7 10*3/uL (ref 3.4–10.8)

## 2021-06-23 LAB — HEMOGLOBIN A1C
Est. average glucose Bld gHb Est-mCnc: 143 mg/dL
Hgb A1c MFr Bld: 6.6 % — ABNORMAL HIGH (ref 4.8–5.6)

## 2021-06-23 LAB — LEVETIRACETAM LEVEL: Levetiracetam Lvl: 46.3 ug/mL — ABNORMAL HIGH (ref 10.0–40.0)

## 2021-06-23 LAB — CK TOTAL AND CKMB (NOT AT ARMC)
CK-MB Index: 2.2 ng/mL (ref 0.0–5.3)
Total CK: 72 U/L (ref 32–182)

## 2021-06-23 MED ORDER — LEVETIRACETAM 750 MG PO TABS: 750.0000 mg | ORAL_TABLET | Freq: Two times a day (BID) | ORAL | 0 refills | Status: DC

## 2021-06-23 NOTE — Telephone Encounter (Signed)
Called and spoke with pt's husband. He is requesting rx for wrist brace for R wrist drop. He states pt is wearing night time one but is wanting one for daytime too. I advised him I would send a message to provider and unsure if he can get this at [facility] or if he would go to a medical store. He would like a CB.  ? ?Summary: Pt husband requests call back regarding Rx for daytime wrist band  ? Pt husband requests call back regarding Rx for a daytime wrist band. Cb# [phone]   ?  ? ?

## 2021-06-23 NOTE — Telephone Encounter (Signed)
Called pt spoke to her husband told him that the patient can wear the night time brace during the day or he can buy another brace from a medical supply store. He verbalized understanding. ? ?KP ?

## 2021-06-23 NOTE — Telephone Encounter (Signed)
Requested Prescriptions  ?Pending Prescriptions Disp Refills  ?? levETIRAcetam (KEPPRA) 750 MG tablet [Pharmacy Med Name: LEVETIRACETAM 750MG  TABLETS] 180 tablet   ?  Sig: TAKE 1 TABLET(750 MG) BY MOUTH TWICE DAILY  ?  ? Neurology:  Anticonvulsants - levetiracetam Failed - 06/23/2021 12:06 PM  ?  ?  Failed - Cr in normal range and within 360 days  ?  Creatinine, Ser  ?Date Value Ref Range Status  ?06/21/2021 1.78 (H) 0.57 - 1.00 mg/dL Final  ?   ?  ?  Passed - Completed PHQ-2 or PHQ-9 in the last 360 days  ?  ?  Passed - Valid encounter within last 12 months  ?  Recent Outpatient Visits   ?      ? 2 days ago Seizure disorder as sequela of cerebrovascular accident [facility])  ? [facility], MD  ? 6 months ago Type II diabetes mellitus with peripheral circulatory disorder [facility])  ? [facility], MD  ? 1 year ago Cellulitis of left lower extremity  ? [facility], MD  ? 1 year ago Type II diabetes mellitus with complication [facility])  ? [facility], MD  ? 2 years ago Annual physical exam  ? [facility], MD  ?  ?  ? ?  ?  ?  ? ? ?

## 2021-06-24 ENCOUNTER — Other Ambulatory Visit: Payer: Self-pay

## 2021-06-24 DIAGNOSIS — R7989 Other specified abnormal findings of blood chemistry: Secondary | ICD-10-CM | POA: Diagnosis not present

## 2021-06-24 DIAGNOSIS — R7981 Abnormal blood-gas level: Secondary | ICD-10-CM

## 2021-06-24 NOTE — Progress Notes (Unsigned)
Error.     KP 

## 2021-06-25 ENCOUNTER — Other Ambulatory Visit: Payer: Self-pay

## 2021-06-25 DIAGNOSIS — R569 Unspecified convulsions: Secondary | ICD-10-CM

## 2021-06-25 LAB — BASIC METABOLIC PANEL
BUN/Creatinine Ratio: 18 (ref 12–28)
BUN: 27 mg/dL (ref 8–27)
CO2: 21 mmol/L (ref 20–29)
Calcium: 10.2 mg/dL (ref 8.7–10.3)
Chloride: 98 mmol/L (ref 96–106)
Creatinine, Ser: 1.54 mg/dL — ABNORMAL HIGH (ref 0.57–1.00)
Glucose: 97 mg/dL (ref 70–99)
Potassium: 4.8 mmol/L (ref 3.5–5.2)
Sodium: 134 mmol/L (ref 134–144)
eGFR: 34 mL/min/{1.73_m2} — ABNORMAL LOW (ref >59)

## 2021-06-25 MED ORDER — LEVETIRACETAM 750 MG PO TABS: 750.0000 mg | ORAL_TABLET | Freq: Three times a day (TID) | ORAL | 0 refills | Status: AC

## 2021-07-05 ENCOUNTER — Other Ambulatory Visit: Payer: Self-pay | Admitting: Internal Medicine

## 2021-07-05 DIAGNOSIS — R569 Unspecified convulsions: Secondary | ICD-10-CM

## 2021-07-07 NOTE — Telephone Encounter (Signed)
Filled 06/25/21

## 2021-07-22 ENCOUNTER — Telehealth: Payer: Self-pay | Admitting: Internal Medicine

## 2021-07-22 NOTE — Telephone Encounter (Signed)
Copied from CRM [id number]. Topic: General - Other >> Jul 22, 2021  1:27 PM Dondra Prader E wrote: Reason for CRM: Pt's husband called to request an FMLA letter that allows the patient's daughter Ethlyn Gallery to sit with her as a caretaker. The patient has had 5 seizures  Best contact: [phone]  P'ts husband wants a call once this letter is ready for pick up.

## 2021-07-23 NOTE — Telephone Encounter (Signed)
Tried calling back to inform that I spoke with Dr Army Melia and she said that she cannot fill out FMLA papers for this. She has never met patients daughter, and she is NOT on the DPR or in the chart to discuss the patient.  Also, the patient see's a neurologist and they will need to discuss the seizures with them.

## 2021-07-23 NOTE — Telephone Encounter (Signed)
Left VM - Unable to reach.

## 2021-07-26 ENCOUNTER — Ambulatory Visit: Payer: Medicare (Managed Care)

## 2021-07-27 IMAGING — MR MR HEAD W/O CM
12 of 13 series · 39 of 48 positions shown · non-contrast
Comparison: CT of the head April 01, 2019

CLINICAL DATA: Hypertension, hyperlipidemia, prior history of LADINES,
CVA and seizure disorder.

EXAM:
MRI HEAD WITHOUT CONTRAST
TECHNIQUE: Multiplanar, multiecho pulse sequences of the brain and surrounding
structures were obtained without intravenous contrast.

[Series 6: ax dwi_tracew · axial · 3.0mm · 0.60mm/px · z∈[-82,+72]mm · 4 of 48 slices shown]
[im 1/48]
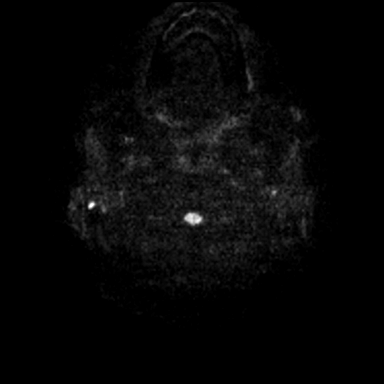
[im 16/48]
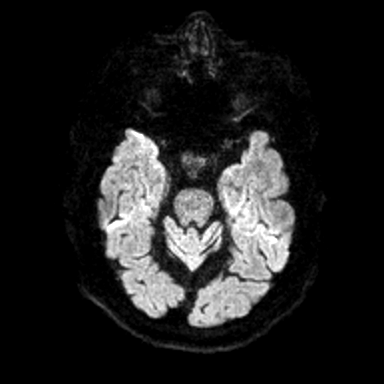
[im 32/48]
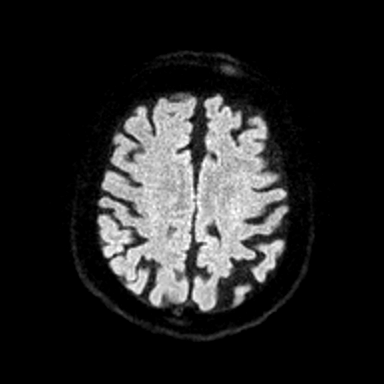
[im 48/48]
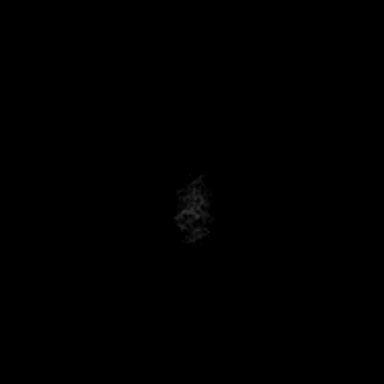

[Series 7: ax dwi_adc · axial · 3.0mm · 0.60mm/px · z∈[-82,+72]mm · 4 of 48 slices shown]
[im 1/48]
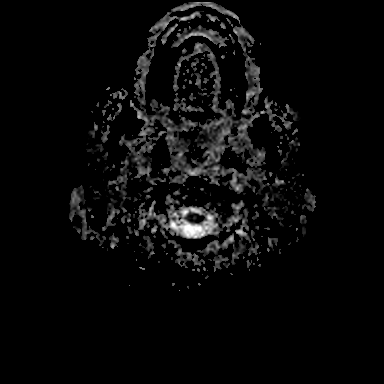
[im 16/48]
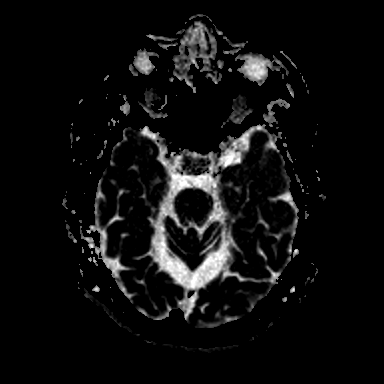
[im 32/48]
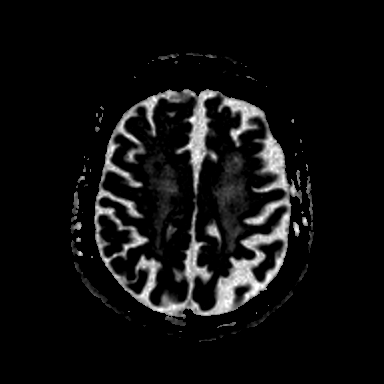
[im 48/48]
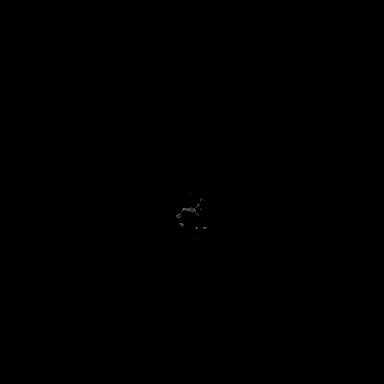

[Series 8: cor dwi_tracew · coronal · 5.0mm · 0.60mm/px · 3 of 40 slices shown]
[im 1/40]
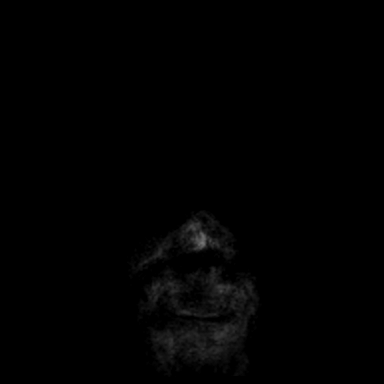
[im 20/40]
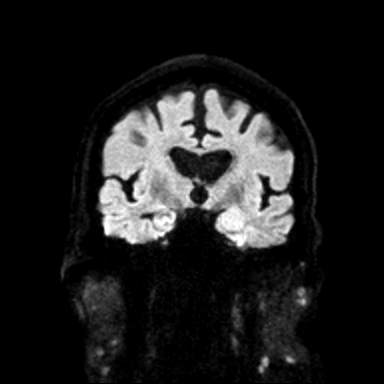
[im 40/40]
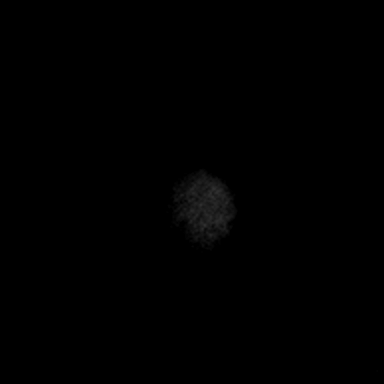

[Series 9: cor dwi_adc · coronal · 5.0mm · 0.60mm/px · 3 of 40 slices shown]
[im 1/40]
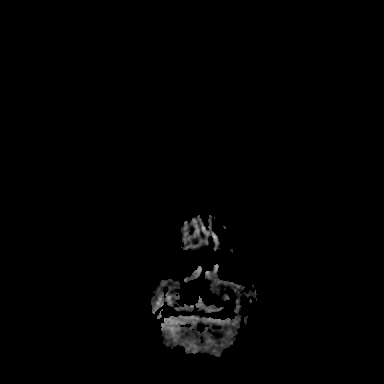
[im 20/40]
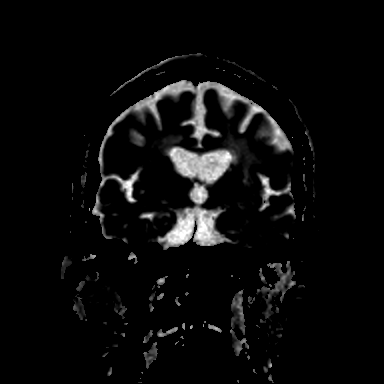
[im 40/40]
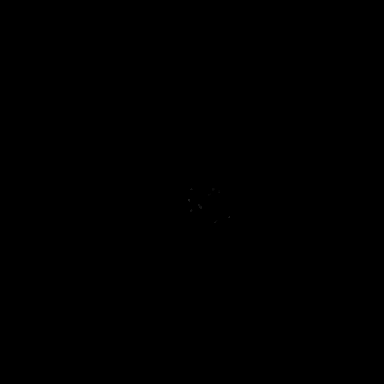

[Series 10: T2 · axial · 5.0mm · 0.53mm/px · z∈[-79,+65]mm · 2 of 25 slices shown (1 of 3)]
[im 1/25]
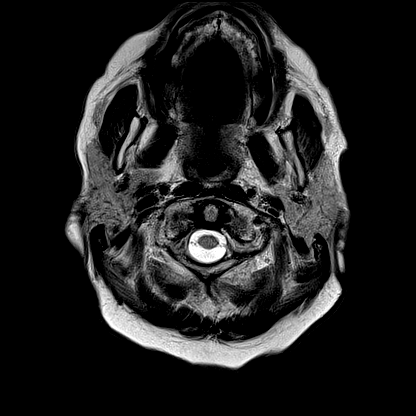
[im 25/25]
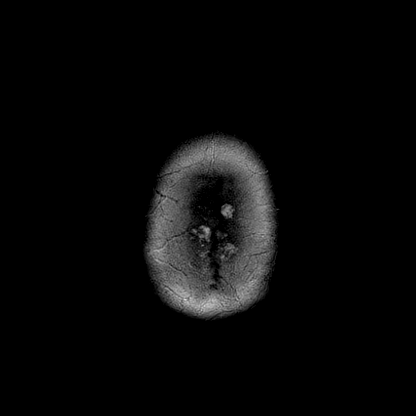

[Series 11: mag_images · axial · 3.0mm · 0.90mm/px · z∈[-95,+69]mm · 4 of 56 slices shown]
[im 1/56]
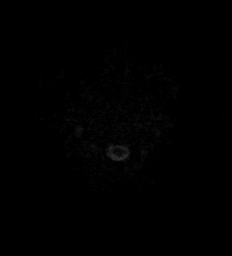
[im 19/56]
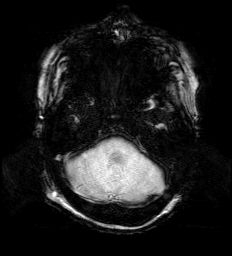
[im 37/56]
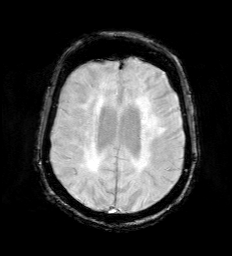
[im 56/56]
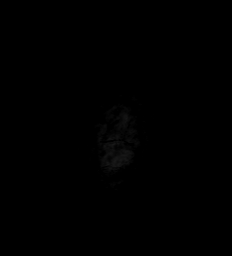

[Series 12: pha_images · axial · 3.0mm · 0.90mm/px · 1 of 55 slices shown]
[im 1/55]
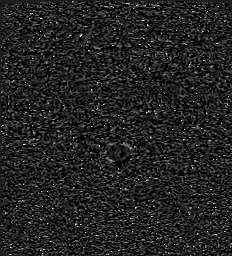

[Series 15: FLAIR · axial · 3.0mm · 0.53mm/px · z∈[-90,+71]mm · 4 of 55 slices shown (1 of 2)]
[im 1/55]
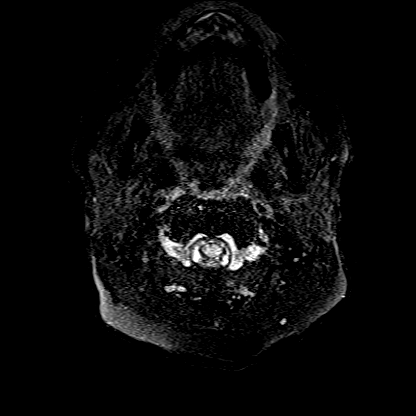
[im 19/55]
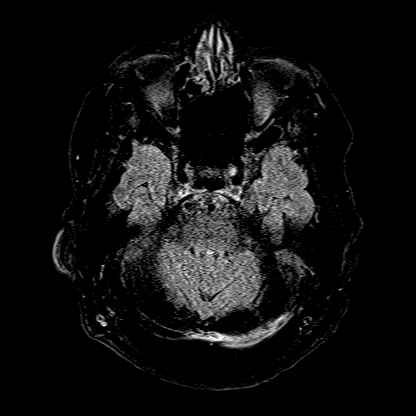
[im 37/55]
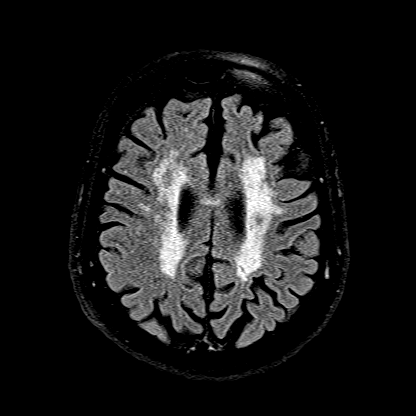
[im 55/55]
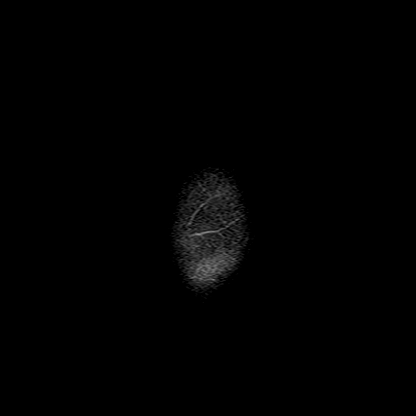

[Series 16: T1 · axial · 1.0mm · 0.98mm/px · z∈[-89,+69]mm · 8 of 160 slices shown]
[im 1/160]
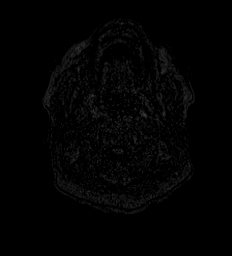
[im 32/160]
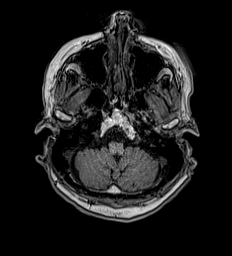
[im 48/160]
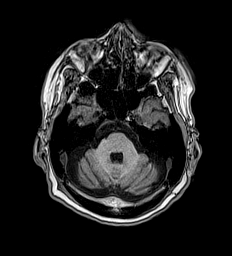
[im 64/160]
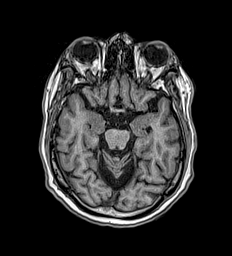
[im 96/160]
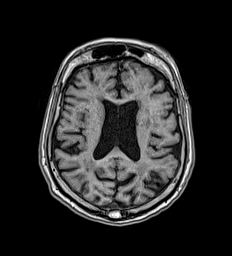
[im 112/160]
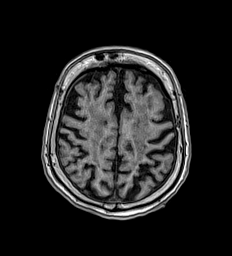
[im 128/160]
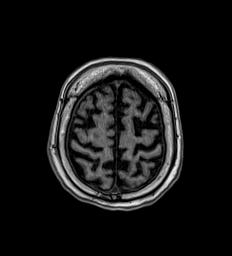
[im 160/160]
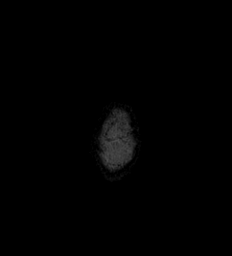

[Series 17: T2 · coronal · 3.0mm · 0.47mm/px · 2 of 35 slices shown (2 of 3)]
[im 1/35]
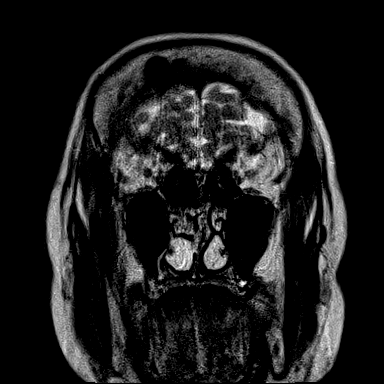
[im 35/35]
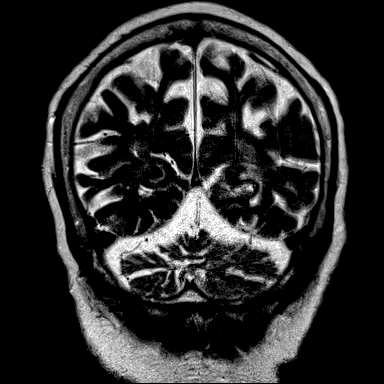

[Series 18: FLAIR · coronal · 3.0mm · 0.47mm/px · 2 of 35 slices shown (2 of 2)]
[im 1/35]
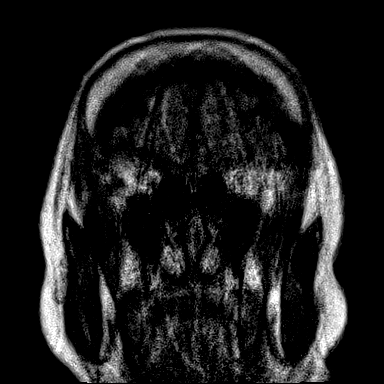
[im 35/35]
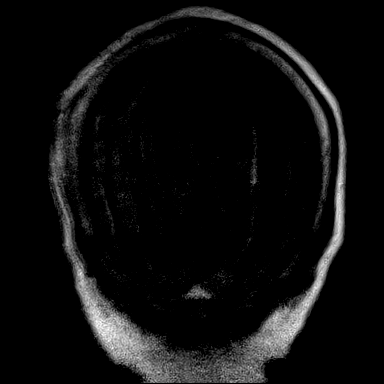

[Series 19: T2 · coronal · 5.0mm · 0.45mm/px · 2 of 31 slices shown (3 of 3)]
[im 1/31]
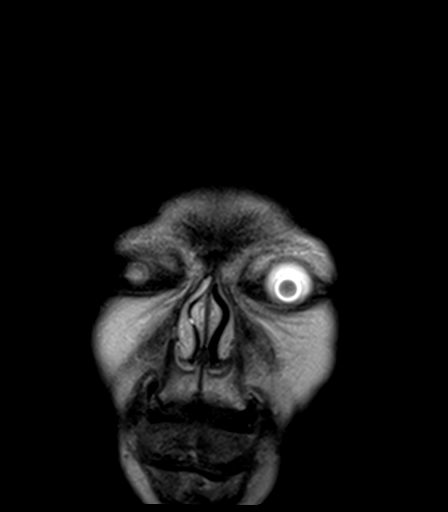
[im 31/31]
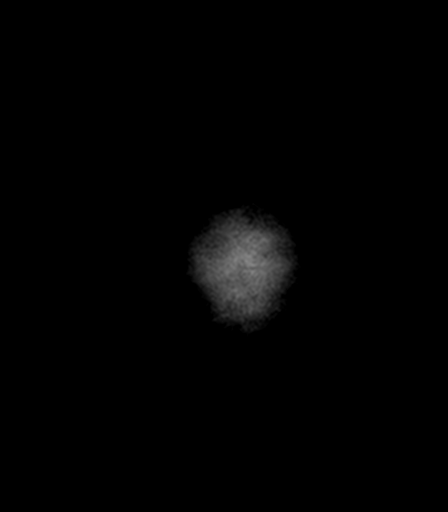

[39 of 48 positions shown; findings below may reference images not displayed]

FINDINGS: The study is partially degraded by motion.

Brain: No acute infarction, hemorrhage, hydrocephalus, extra-axial
collection or mass lesion. Remote lacunar infarcts are seen in the
bilateral cerebellar hemispheres, basal ganglia and thalami.
Scattered and confluent T2 hyperintensity within the white matter of
the cerebral hemispheres nonspecific, most likely related to chronic
small vessel ischemia. There is prominence of the supratentorial
ventricles and cerebral sulci, reflecting parenchymal volume loss.
There is marked cerebella volume loss. This can be seen in long-term
use of Phenytoin.

Vascular: Normal flow voids.

Skull and upper cervical spine: Normal marrow signal.

Sinuses/Orbits: Negative.

Other: None.
IMPRESSION: 1. No acute intracranial abnormality.
2. Moderate to advanced chronic white matter disease.
3. Marked cerebellar parenchymal volume loss. This can be seen in
long-term use of Phenytoin.

## 2021-08-24 ENCOUNTER — Telehealth: Payer: Self-pay | Admitting: Internal Medicine

## 2021-08-24 NOTE — Telephone Encounter (Signed)
Copied from CRM [id number]. Topic: Medicare AWV >> Aug 24, 2021  9:20 AM Zannie Kehr wrote: Reason for CRM:  No answer unable to leave a message for patient to call back and schedule Medicare Annual Wellness Visit (AWV) in office.   If unable to come into the office for AWV,  please offer to do virtually or by telephone.  Last AWV:  07/22/2020  Please schedule at anytime with [facility] Health Advisor.      30 minute appointment for Virtual or phone 45 minute appointment for in office or Initial virtual/phone  Any questions, please call me at [phone]

## 2021-09-02 DIAGNOSIS — Z7189 Other specified counseling: Secondary | ICD-10-CM | POA: Diagnosis not present

## 2021-09-02 DIAGNOSIS — D6489 Other specified anemias: Secondary | ICD-10-CM | POA: Diagnosis not present

## 2021-09-02 DIAGNOSIS — S8991XA Unspecified injury of right lower leg, initial encounter: Secondary | ICD-10-CM | POA: Diagnosis not present

## 2021-09-02 DIAGNOSIS — Z886 Allergy status to analgesic agent status: Secondary | ICD-10-CM | POA: Diagnosis not present

## 2021-09-02 DIAGNOSIS — E785 Hyperlipidemia, unspecified: Secondary | ICD-10-CM | POA: Diagnosis not present

## 2021-09-02 DIAGNOSIS — S3993XA Unspecified injury of pelvis, initial encounter: Secondary | ICD-10-CM | POA: Diagnosis not present

## 2021-09-02 DIAGNOSIS — T148XXA Other injury of unspecified body region, initial encounter: Secondary | ICD-10-CM | POA: Diagnosis not present

## 2021-09-02 DIAGNOSIS — S80211A Abrasion, right knee, initial encounter: Secondary | ICD-10-CM | POA: Diagnosis not present

## 2021-09-02 DIAGNOSIS — M542 Cervicalgia: Secondary | ICD-10-CM | POA: Diagnosis not present

## 2021-09-02 DIAGNOSIS — S06379A Contusion, laceration, and hemorrhage of cerebellum with loss of consciousness of unspecified duration, initial encounter: Secondary | ICD-10-CM | POA: Diagnosis not present

## 2021-09-02 DIAGNOSIS — Z79899 Other long term (current) drug therapy: Secondary | ICD-10-CM | POA: Diagnosis not present

## 2021-09-02 DIAGNOSIS — M25461 Effusion, right knee: Secondary | ICD-10-CM | POA: Diagnosis not present

## 2021-09-02 DIAGNOSIS — Z515 Encounter for palliative care: Secondary | ICD-10-CM | POA: Diagnosis not present

## 2021-09-02 DIAGNOSIS — R739 Hyperglycemia, unspecified: Secondary | ICD-10-CM | POA: Diagnosis not present

## 2021-09-02 DIAGNOSIS — I2699 Other pulmonary embolism without acute cor pulmonale: Secondary | ICD-10-CM | POA: Diagnosis not present

## 2021-09-02 DIAGNOSIS — S066X0A Traumatic subarachnoid hemorrhage without loss of consciousness, initial encounter: Secondary | ICD-10-CM | POA: Diagnosis not present

## 2021-09-02 DIAGNOSIS — F03B0 Unspecified dementia, moderate, without behavioral disturbance, psychotic disturbance, mood disturbance, and anxiety: Secondary | ICD-10-CM | POA: Diagnosis not present

## 2021-09-02 DIAGNOSIS — M1990 Unspecified osteoarthritis, unspecified site: Secondary | ICD-10-CM | POA: Diagnosis not present

## 2021-09-02 DIAGNOSIS — S0001XA Abrasion of scalp, initial encounter: Secondary | ICD-10-CM | POA: Diagnosis not present

## 2021-09-02 DIAGNOSIS — S199XXA Unspecified injury of neck, initial encounter: Secondary | ICD-10-CM | POA: Diagnosis not present

## 2021-09-02 DIAGNOSIS — S8001XA Contusion of right knee, initial encounter: Secondary | ICD-10-CM | POA: Diagnosis not present

## 2021-09-02 DIAGNOSIS — I872 Venous insufficiency (chronic) (peripheral): Secondary | ICD-10-CM | POA: Diagnosis not present

## 2021-09-02 DIAGNOSIS — Z8673 Personal history of transient ischemic attack (TIA), and cerebral infarction without residual deficits: Secondary | ICD-10-CM | POA: Diagnosis not present

## 2021-09-02 DIAGNOSIS — S0990XA Unspecified injury of head, initial encounter: Secondary | ICD-10-CM | POA: Diagnosis not present

## 2021-09-02 DIAGNOSIS — S0081XA Abrasion of other part of head, initial encounter: Secondary | ICD-10-CM | POA: Diagnosis not present

## 2021-09-02 DIAGNOSIS — S3992XA Unspecified injury of lower back, initial encounter: Secondary | ICD-10-CM | POA: Diagnosis not present

## 2021-09-02 DIAGNOSIS — M549 Dorsalgia, unspecified: Secondary | ICD-10-CM | POA: Diagnosis not present

## 2021-09-02 DIAGNOSIS — W19XXXA Unspecified fall, initial encounter: Secondary | ICD-10-CM | POA: Diagnosis not present

## 2021-09-02 DIAGNOSIS — I609 Nontraumatic subarachnoid hemorrhage, unspecified: Secondary | ICD-10-CM | POA: Diagnosis not present

## 2021-09-02 DIAGNOSIS — S299XXA Unspecified injury of thorax, initial encounter: Secondary | ICD-10-CM | POA: Diagnosis not present

## 2021-09-02 DIAGNOSIS — S0003XA Contusion of scalp, initial encounter: Secondary | ICD-10-CM | POA: Diagnosis not present

## 2021-09-02 DIAGNOSIS — R5381 Other malaise: Secondary | ICD-10-CM | POA: Diagnosis not present

## 2021-09-02 DIAGNOSIS — I2694 Multiple subsegmental pulmonary emboli without acute cor pulmonale: Secondary | ICD-10-CM | POA: Diagnosis not present

## 2021-09-02 DIAGNOSIS — Z743 Need for continuous supervision: Secondary | ICD-10-CM | POA: Diagnosis not present

## 2021-09-02 DIAGNOSIS — Y9289 Other specified places as the place of occurrence of the external cause: Secondary | ICD-10-CM | POA: Diagnosis not present

## 2021-09-02 DIAGNOSIS — S129XXA Fracture of neck, unspecified, initial encounter: Secondary | ICD-10-CM | POA: Diagnosis not present

## 2021-09-02 DIAGNOSIS — W109XXA Fall (on) (from) unspecified stairs and steps, initial encounter: Secondary | ICD-10-CM | POA: Diagnosis not present

## 2021-09-02 DIAGNOSIS — S8002XA Contusion of left knee, initial encounter: Secondary | ICD-10-CM | POA: Diagnosis not present

## 2021-09-02 DIAGNOSIS — S8992XA Unspecified injury of left lower leg, initial encounter: Secondary | ICD-10-CM | POA: Diagnosis not present

## 2021-09-02 DIAGNOSIS — S066X9A Traumatic subarachnoid hemorrhage with loss of consciousness of unspecified duration, initial encounter: Secondary | ICD-10-CM | POA: Diagnosis not present

## 2021-09-02 DIAGNOSIS — S12100A Unspecified displaced fracture of second cervical vertebra, initial encounter for closed fracture: Secondary | ICD-10-CM | POA: Diagnosis not present

## 2021-09-02 DIAGNOSIS — S80212A Abrasion, left knee, initial encounter: Secondary | ICD-10-CM | POA: Diagnosis not present

## 2021-09-02 DIAGNOSIS — I10 Essential (primary) hypertension: Secondary | ICD-10-CM | POA: Diagnosis not present

## 2021-09-02 DIAGNOSIS — S6991XA Unspecified injury of right wrist, hand and finger(s), initial encounter: Secondary | ICD-10-CM | POA: Diagnosis not present

## 2021-09-02 DIAGNOSIS — W108XXA Fall (on) (from) other stairs and steps, initial encounter: Secondary | ICD-10-CM | POA: Diagnosis not present

## 2021-09-02 DIAGNOSIS — R531 Weakness: Secondary | ICD-10-CM | POA: Diagnosis not present

## 2021-09-02 DIAGNOSIS — Z7984 Long term (current) use of oral hypoglycemic drugs: Secondary | ICD-10-CM | POA: Diagnosis not present

## 2021-09-10 DIAGNOSIS — E119 Type 2 diabetes mellitus without complications: Secondary | ICD-10-CM | POA: Diagnosis not present

## 2021-09-10 DIAGNOSIS — D649 Anemia, unspecified: Secondary | ICD-10-CM | POA: Diagnosis not present

## 2021-09-10 DIAGNOSIS — I2699 Other pulmonary embolism without acute cor pulmonale: Secondary | ICD-10-CM | POA: Diagnosis not present

## 2021-09-10 DIAGNOSIS — Z7901 Long term (current) use of anticoagulants: Secondary | ICD-10-CM | POA: Diagnosis not present

## 2021-09-16 DIAGNOSIS — S12100A Unspecified displaced fracture of second cervical vertebra, initial encounter for closed fracture: Secondary | ICD-10-CM | POA: Diagnosis not present

## 2021-09-27 DIAGNOSIS — H168 Other keratitis: Secondary | ICD-10-CM | POA: Diagnosis not present

## 2021-09-27 DIAGNOSIS — E1151 Type 2 diabetes mellitus with diabetic peripheral angiopathy without gangrene: Secondary | ICD-10-CM | POA: Diagnosis not present

## 2021-09-27 DIAGNOSIS — B351 Tinea unguium: Secondary | ICD-10-CM | POA: Diagnosis not present

## 2021-09-28 ENCOUNTER — Telehealth: Payer: Self-pay | Admitting: Internal Medicine

## 2021-09-28 NOTE — Telephone Encounter (Signed)
Copied from CRM [id number]. Topic: Medicare AWV >> Sep 28, 2021  1:17 PM Zannie Kehr wrote: Reason for CRM:  No answer unable to leave a message for patient to call back and schedule Medicare Annual Wellness Visit (AWV) in office.   If unable to come into the office for AWV,  please offer to do virtually or by telephone.  Last AWV: 07/22/2020  Please schedule at anytime with [facility] Health Advisor.      30 minute appointment for Virtual or phone 45 minute appointment for in office or Initial virtual/phone  Any questions, please call me at [phone]

## 2021-11-02 DIAGNOSIS — R918 Other nonspecific abnormal finding of lung field: Secondary | ICD-10-CM | POA: Diagnosis not present

## 2021-11-02 DIAGNOSIS — G40909 Epilepsy, unspecified, not intractable, without status epilepticus: Secondary | ICD-10-CM | POA: Diagnosis not present

## 2021-11-02 DIAGNOSIS — N183 Chronic kidney disease, stage 3 unspecified: Secondary | ICD-10-CM | POA: Diagnosis not present

## 2021-11-02 DIAGNOSIS — I2699 Other pulmonary embolism without acute cor pulmonale: Secondary | ICD-10-CM | POA: Diagnosis not present

## 2021-11-02 DIAGNOSIS — R531 Weakness: Secondary | ICD-10-CM | POA: Diagnosis not present

## 2021-11-02 DIAGNOSIS — N3000 Acute cystitis without hematuria: Secondary | ICD-10-CM | POA: Diagnosis not present

## 2021-11-02 DIAGNOSIS — J9811 Atelectasis: Secondary | ICD-10-CM | POA: Diagnosis not present

## 2021-11-02 DIAGNOSIS — I639 Cerebral infarction, unspecified: Secondary | ICD-10-CM | POA: Diagnosis not present

## 2021-11-02 DIAGNOSIS — I69951 Hemiplegia and hemiparesis following unspecified cerebrovascular disease affecting right dominant side: Secondary | ICD-10-CM | POA: Diagnosis not present

## 2021-11-02 DIAGNOSIS — B961 Klebsiella pneumoniae [K. pneumoniae] as the cause of diseases classified elsewhere: Secondary | ICD-10-CM | POA: Diagnosis not present

## 2021-11-02 DIAGNOSIS — E118 Type 2 diabetes mellitus with unspecified complications: Secondary | ICD-10-CM | POA: Diagnosis not present

## 2021-11-02 DIAGNOSIS — I10 Essential (primary) hypertension: Secondary | ICD-10-CM | POA: Diagnosis not present

## 2021-11-02 DIAGNOSIS — E1151 Type 2 diabetes mellitus with diabetic peripheral angiopathy without gangrene: Secondary | ICD-10-CM | POA: Diagnosis not present

## 2021-11-02 DIAGNOSIS — Z794 Long term (current) use of insulin: Secondary | ICD-10-CM | POA: Diagnosis not present

## 2021-11-02 DIAGNOSIS — R569 Unspecified convulsions: Secondary | ICD-10-CM | POA: Diagnosis not present

## 2021-11-02 DIAGNOSIS — I129 Hypertensive chronic kidney disease with stage 1 through stage 4 chronic kidney disease, or unspecified chronic kidney disease: Secondary | ICD-10-CM | POA: Diagnosis not present

## 2021-11-02 DIAGNOSIS — Z79899 Other long term (current) drug therapy: Secondary | ICD-10-CM | POA: Diagnosis not present

## 2021-11-02 DIAGNOSIS — J690 Pneumonitis due to inhalation of food and vomit: Secondary | ICD-10-CM | POA: Diagnosis not present

## 2021-11-02 DIAGNOSIS — E785 Hyperlipidemia, unspecified: Secondary | ICD-10-CM | POA: Diagnosis not present

## 2021-11-02 DIAGNOSIS — Z86711 Personal history of pulmonary embolism: Secondary | ICD-10-CM | POA: Diagnosis not present

## 2021-11-02 DIAGNOSIS — G9340 Encephalopathy, unspecified: Secondary | ICD-10-CM | POA: Diagnosis not present

## 2021-11-02 DIAGNOSIS — Z8673 Personal history of transient ischemic attack (TIA), and cerebral infarction without residual deficits: Secondary | ICD-10-CM | POA: Diagnosis not present

## 2021-11-02 DIAGNOSIS — R0902 Hypoxemia: Secondary | ICD-10-CM | POA: Diagnosis not present

## 2021-11-02 DIAGNOSIS — Z7901 Long term (current) use of anticoagulants: Secondary | ICD-10-CM | POA: Diagnosis not present

## 2021-11-02 DIAGNOSIS — Z743 Need for continuous supervision: Secondary | ICD-10-CM | POA: Diagnosis not present

## 2021-11-02 DIAGNOSIS — E1165 Type 2 diabetes mellitus with hyperglycemia: Secondary | ICD-10-CM | POA: Diagnosis not present

## 2021-11-02 DIAGNOSIS — E119 Type 2 diabetes mellitus without complications: Secondary | ICD-10-CM | POA: Diagnosis not present

## 2021-11-02 DIAGNOSIS — Z885 Allergy status to narcotic agent status: Secondary | ICD-10-CM | POA: Diagnosis not present

## 2021-11-02 DIAGNOSIS — I499 Cardiac arrhythmia, unspecified: Secondary | ICD-10-CM | POA: Diagnosis not present

## 2021-11-10 DIAGNOSIS — I10 Essential (primary) hypertension: Secondary | ICD-10-CM | POA: Diagnosis not present

## 2021-11-10 DIAGNOSIS — G40909 Epilepsy, unspecified, not intractable, without status epilepticus: Secondary | ICD-10-CM | POA: Diagnosis not present

## 2021-11-12 DIAGNOSIS — E1151 Type 2 diabetes mellitus with diabetic peripheral angiopathy without gangrene: Secondary | ICD-10-CM | POA: Diagnosis not present

## 2021-11-24 ENCOUNTER — Telehealth: Payer: Self-pay | Admitting: Internal Medicine

## 2021-11-24 NOTE — Telephone Encounter (Signed)
Copied from [place] [id number]. Topic: Medicare AWV >> Nov 24, 2021  1:55 PM Jae Dire wrote: Reason for CRM:  No answer unable to leave a message for patient to call back and schedule Medicare Annual Wellness Visit (AWV) in office.   If unable to come into the office for AWV,  please offer to do virtually or by telephone.  Last AWV: 07/22/2020   Please schedule at any time with [facility] Health Advisor.      30 minute appointment for Virtual or phone 45 minute appointment for in office or Initial virtual/phone  Any questions, please call me at [phone]

## 2021-11-25 ENCOUNTER — Other Ambulatory Visit: Payer: Self-pay | Admitting: Internal Medicine

## 2021-11-25 DIAGNOSIS — Z794 Long term (current) use of insulin: Secondary | ICD-10-CM | POA: Diagnosis not present

## 2021-11-25 DIAGNOSIS — K750 Abscess of liver: Secondary | ICD-10-CM | POA: Diagnosis not present

## 2021-11-25 DIAGNOSIS — E0800 Diabetes mellitus due to underlying condition with hyperosmolarity without nonketotic hyperglycemic-hyperosmolar coma (NKHHC): Secondary | ICD-10-CM | POA: Diagnosis not present

## 2021-11-25 DIAGNOSIS — L02412 Cutaneous abscess of left axilla: Secondary | ICD-10-CM | POA: Diagnosis not present

## 2021-11-25 DIAGNOSIS — I10 Essential (primary) hypertension: Secondary | ICD-10-CM

## 2021-11-26 NOTE — Telephone Encounter (Signed)
Patient will need an office visit for further refills. Requested Prescriptions  Pending Prescriptions Disp Refills  . lisinopril-hydrochlorothiazide (ZESTORETIC) 10-12.5 MG tablet [Pharmacy Med Name: LISINOPRIL-HCTZ 10/12.5MG TABLETS] 90 tablet 0    Sig: TAKE 1 TABLET BY MOUTH DAILY     Cardiovascular:  ACEI + Diuretic Combos Failed - 11/25/2021  9:46 PM      Failed - Cr in normal range and within 180 days    Creatinine, Ser  Date Value Ref Range Status  06/24/2021 1.54 (H) 0.57 - 1.00 mg/dL Final         Passed - Na in normal range and within 180 days    Sodium  Date Value Ref Range Status  06/24/2021 134 134 - 144 mmol/L Final         Passed - K in normal range and within 180 days    Potassium  Date Value Ref Range Status  06/24/2021 4.8 3.5 - 5.2 mmol/L Final         Passed - eGFR is 30 or above and within 180 days    GFR calc Af Amer  Date Value Ref Range Status  04/02/2019 >60 >60 mL/min Final   GFR, Estimated  Date Value Ref Range Status  04/03/2020 57 (L) >60 mL/min Final    Comment:    (NOTE) Calculated using the CKD-EPI Creatinine Equation (2021)    eGFR  Date Value Ref Range Status  06/24/2021 34 (L) >59 mL/min/1.73 Final         Passed - Patient is not pregnant      Passed - Last BP in normal range    BP Readings from Last 1 Encounters:  06/21/21 138/82         Passed - Valid encounter within last 6 months    Recent Outpatient Visits          5 months ago Seizure disorder as sequela of cerebrovascular accident [facility])   [place] [facility], Jesse Sans, MD   11 months ago Type II diabetes mellitus with peripheral circulatory disorder [facility])   [place] [facility] at [facility], Jesse Sans, MD   1 year ago Cellulitis of left lower extremity   [place] [facility] at [facility], Jesse Sans, MD   1 year ago Type II diabetes mellitus  with complication [facility])   [place] [facility] at [facility], Jesse Sans, MD   3 years ago Annual physical exam   [facility] at [facility], Jesse Sans, MD

## 2022-01-05 ENCOUNTER — Telehealth: Payer: Self-pay | Admitting: Internal Medicine

## 2022-01-05 NOTE — Telephone Encounter (Signed)
Copied from CRM [id number]. Topic: Medicare AWV >> Jan 05, 2022  2:10 PM Payton Doughty wrote: Reason for CRM: Left message tor patient to schedule Medicare Annual Wellness Visit (AWV) with [facility] Health Advisor.  Appointment can be an offiice/telephone or virtual visit;  Please call [phone] ask for [facility].

## 2022-01-13 ENCOUNTER — Telehealth: Payer: Self-pay | Admitting: Internal Medicine

## 2022-01-13 NOTE — Telephone Encounter (Signed)
Copied from CRM [id number]. Topic: Medicare AWV >> Jan 13, 2022 10:05 AM Payton Doughty wrote: Reason for CRM: Left message tor patient to schedule Medicare Annual Wellness Visit (AWV) with [facility] Health Advisor.  Appointment can be an offiice/telephone or virtual visit;  Please call [phone] ask for [facility].

## 2022-03-07 ENCOUNTER — Telehealth: Payer: Self-pay | Admitting: Internal Medicine

## 2022-03-07 NOTE — Telephone Encounter (Signed)
Spoke with patient spouse he stated she doesn't live [place] anymore and hung up

## 2022-07-09 DEATH — deceased
